# Patient Record
Sex: Male | Born: 1970
Health system: Southern US, Community
[De-identification: ages and names within clinical notes are randomized; demographics above are authoritative.]

## PROBLEM LIST (undated history)

## (undated) VITALS — BP 114/70 | HR 77 | Ht 73.5 in | Wt 290.5 lb

## (undated) DIAGNOSIS — M25562 Pain in left knee: Secondary | ICD-10-CM

## (undated) DIAGNOSIS — M25561 Pain in right knee: Secondary | ICD-10-CM

## (undated) DIAGNOSIS — R0789 Other chest pain: Secondary | ICD-10-CM

## (undated) DIAGNOSIS — E669 Obesity, unspecified: Secondary | ICD-10-CM

## (undated) DIAGNOSIS — Z0000 Encounter for general adult medical examination without abnormal findings: Secondary | ICD-10-CM

## (undated) DIAGNOSIS — M549 Dorsalgia, unspecified: Secondary | ICD-10-CM

## (undated) HISTORY — DX: Dorsalgia, unspecified: M54.9

## (undated) HISTORY — DX: Pain in left knee: M25.562

## (undated) HISTORY — DX: Obesity, unspecified: E66.9

## (undated) HISTORY — DX: Pain in right knee: M25.561

## (undated) HISTORY — DX: Encounter for general adult medical examination without abnormal findings: Z00.00

## (undated) HISTORY — DX: Other chest pain: R07.89

## (undated) HISTORY — PX: BACK SURGERY: SHX140

---

## 1997-11-27 HISTORY — PX: KNEE SURGERY: SHX244

## 2009-10-20 ENCOUNTER — Encounter: Payer: Self-pay | Admitting: Family Medicine

## 2010-06-13 ENCOUNTER — Ambulatory Visit: Payer: Self-pay | Admitting: Family Medicine

## 2010-06-13 DIAGNOSIS — M25519 Pain in unspecified shoulder: Secondary | ICD-10-CM | POA: Insufficient documentation

## 2010-06-13 DIAGNOSIS — M545 Low back pain, unspecified: Secondary | ICD-10-CM | POA: Insufficient documentation

## 2010-06-13 DIAGNOSIS — M25529 Pain in unspecified elbow: Secondary | ICD-10-CM | POA: Insufficient documentation

## 2010-06-13 DIAGNOSIS — R030 Elevated blood-pressure reading, without diagnosis of hypertension: Secondary | ICD-10-CM | POA: Insufficient documentation

## 2010-06-13 DIAGNOSIS — M549 Dorsalgia, unspecified: Secondary | ICD-10-CM

## 2010-06-13 DIAGNOSIS — E782 Mixed hyperlipidemia: Secondary | ICD-10-CM | POA: Insufficient documentation

## 2010-06-13 DIAGNOSIS — F418 Other specified anxiety disorders: Secondary | ICD-10-CM | POA: Insufficient documentation

## 2010-06-13 DIAGNOSIS — G43009 Migraine without aura, not intractable, without status migrainosus: Secondary | ICD-10-CM | POA: Insufficient documentation

## 2010-06-13 DIAGNOSIS — G039 Meningitis, unspecified: Secondary | ICD-10-CM | POA: Insufficient documentation

## 2010-06-13 DIAGNOSIS — E669 Obesity, unspecified: Secondary | ICD-10-CM

## 2010-06-13 HISTORY — DX: Obesity, unspecified: E66.9

## 2010-06-13 HISTORY — DX: Dorsalgia, unspecified: M54.9

## 2010-11-07 ENCOUNTER — Ambulatory Visit: Payer: Self-pay | Admitting: Family Medicine

## 2010-11-07 ENCOUNTER — Encounter: Payer: Self-pay | Admitting: Family Medicine

## 2010-12-14 ENCOUNTER — Telehealth (INDEPENDENT_AMBULATORY_CARE_PROVIDER_SITE_OTHER): Payer: Self-pay | Admitting: *Deleted

## 2010-12-21 ENCOUNTER — Encounter: Payer: Self-pay | Admitting: Family Medicine

## 2010-12-27 NOTE — Letter (Signed)
Summary: Records from [facility] from [facility]   Imported By: Maryln Gottron 06/22/2010 15:49:49  _____________________________________________________________________  External Attachment:    Type:   Image     Comment:   External Document

## 2010-12-27 NOTE — Assessment & Plan Note (Signed)
Summary: TO EST [place]//CCM wife rsc/njr   Vital Signs:  Patient profile:   40 year old male Height:      73.5 inches (186.69 cm) Weight:      284 pounds (129.09 kg) BMI:     37.09 O2 Sat:      98 % on Room air Temp:     97.9 degrees F (36.61 degrees C) oral Pulse rate:   84 / minute BP sitting:   122 / 90  (left arm) Cuff size:   large  Vitals Entered By: Josph Macho RMA (June 13, 2010 2:50 PM)  O2 Flow:  Room air CC: Est new pt/ CF Is Patient Diabetic? No   History of Present Illness: Patient in today with his wife to establish care. His complaints are all musculoskeletal today. He has a long history of back pain primarily low back. Suffered an injury years ago and had to have surgical decompression, has been managing it medically since then. Recently tried a pain management doctor with some PT and shots and did not find that helpful. Tries to minimize taking medicaitions but is recently having more trouble with pain and even getting out of bed on some days. Denies any incontinence or constipation. Is having new pains in his left elbow. Pain has been present for roughly a month w/o any associated swelling/heat/redness/trauma. She notes pain over lateral epicondye up arm to shoulder. Pain is severe enough to make him feel week. He denies numbness/tingling weakness in hand. He is also noting worsing  left knee pain and has radicular symptoms of pain and tingling in b/l Lower Extremities from knees down. No recent illnees, fevers, chills, malaise, CP, palp, SOB, GI or GU c/o.  Preventive Screening-Counseling & Management  Alcohol-Tobacco     Alcohol drinks/day: 0     Smoking Status: current     Smoking Cessation Counseling: YES     Packs/Day: 1.0  Caffeine-Diet-Exercise     Does Patient Exercise: no  Safety-Violence-Falls     Seat Belt Use: yes      Drug Use:  never and no.    Current Medications (verified): 1)  Valium 5 Mg Tabs (Diazepam) .... Once Daily 2)  Vicoprofen  7.5-200 Mg Tabs (Hydrocodone-Ibuprofen) .... As Needed  Allergies (verified): 1)  ! * Contrast Dye 2)  ! * Bee Stings  Past History:  Past Surgical History: Back surgery x 2 Right knee surgery 1999 for cartilage repair.   Family History: Father:  Mother:  Siblings:  MGM: MGF: PGM: PGF: Children: Son 1 yo, [employer] Family History of Arthritis Family History Diabetes 1st degree relative Family History Hypertension Family History of Stroke M 1st degree relative <50 Family History of Sudden Death  Social History: Retired/Disabled Married Current Smoker 1 ppd Alcohol use-no Drug use-no Regular exercise-no Smoking Status:  current Drug Use:  never, no Does Patient Exercise:  no Packs/Day:  1.0 Seat Belt Use:  yes  Physical Exam  General:  Well-developed,well-nourished,in no acute distress; alert,appropriate and cooperative throughout examination Head:  Normocephalic and atraumatic without obvious abnormalities. No apparent alopecia or balding. Eyes:  No corneal or conjunctival inflammation noted. EOMI. Perrla.  Ears:  External ear exam shows no significant lesions or deformities.  Otoscopic examination reveals clear canals, tympanic membranes are intact bilaterally without bulging, retraction, inflammation or discharge. Hearing is grossly normal bilaterally. Nose:  External nasal examination shows no deformity or inflammation. Nasal mucosa are pink and moist without lesions or exudates. Mouth:  Oral mucosa and  oropharynx without lesions or exudates.  Teeth in good repair. Neck:  No deformities, masses, or tenderness noted. Lungs:  Normal respiratory effort, chest expands symmetrically. Lungs are clear to auscultation, no crackles or wheezes. Heart:  Normal rate and regular rhythm. S1 and S2 normal without gallop, murmur, click, rub or other extra sounds. Abdomen:  Bowel sounds positive,abdomen soft and non-tender without masses, organomegaly or hernias noted. Msk:  No  deformity or scoliosis noted of thoracic or lumbar spine.   Pulses:  R and L carotid dorsalis pedis and posterior tibial pulses are full and equal bilaterally Extremities:  No clubbing, cyanosis, edema, or deformity noted  Neurologic:  No cranial nerve deficits noted. Station and gait are normal. Plantar reflexes are down-going bilaterally. DTRs are symmetrical throughout. Sensory, motor and coordinative functions appear intact. Skin:  Intact without suspicious lesions or rashes Cervical Nodes:  No lymphadenopathy noted Psych:  Cognition and judgment appear intact. Alert and cooperative with normal attention span and concentration. No apparent delusions, illusions, hallucinations   Impression & Recommendations:  Problem # 1:  ELBOW PAIN, LEFT (ICD-719.42) Prednisone burst given and cont other pain meds as needed, patient will contact his orthopaedist at [facility] regarding evaluation of left elbow and shoulder if he needs a new referral for this he will contact the office  Problem # 2:  ELEVATED BLOOD PRESSURE WITHOUT DIAGNOSIS OF HYPERTENSION (ICD-796.2) Mildly elevation of diastolic pressure, avoid sodium and attempt 7 hours sleep each night. Continue to monitor  Problem # 3:  OVERWEIGHT (ICD-278.02) Encouraged decreased by mouth intake and as much movement as tolerated  Problem # 4:  MIXED HYPERLIPIDEMIA (ICD-272.2) Avoid trans and saturated fats, increase exercise, consider fish oil caps  Complete Medication List: 1)  Valium 5 Mg Tabs (Diazepam) .... Once daily 2)  Vicoprofen 7.5-200 Mg Tabs (Hydrocodone-ibuprofen) .Marland Kitchen.. 1 tab by mouth q 6 hours as needed pain 3)  Baclofen 10 Mg Tabs (Baclofen) .Marland Kitchen.. 1-2 tabs by mouth q 6 hours as needed pain 4)  Prednisone 20 Mg Tabs (Prednisone) .... 2 tabs by mouth once daily x 5d  Patient Instructions: 1)  Please schedule a follow-up appointment in 3 months to 4 months 2)  Tobacco is very bad for your health and your loved ones ! You should stop  smoking !  3)  Stop smoking tips: Choose a quit date. Cut down before the quit date. Decide what you will do as a substitute when you feel the urge to smoke(gum, toothpick, exercise).  4)  You need to lose weight. Consider a lower calorie diet and regular exercise.  5)  apply heat and/or ice to left shoulder and left elbow and contact Dr Luther Parody at [facility] for further evaluation Prescriptions: VICOPROFEN 7.5-200 MG TABS (HYDROCODONE-IBUPROFEN) 1 tab by mouth q 6 hours as needed pain  #90 x 1   Entered and Authorized by:   Danise Edge MD   Signed by:   Danise Edge MD on 06/13/2010   Method used:   Print then Give to Patient   RxID:   [id number] PREDNISONE 20 MG TABS (PREDNISONE) 2 tabs by mouth once daily x 5d  #10 x 0   Entered and Authorized by:   Danise Edge MD   Signed by:   Danise Edge MD on 06/13/2010   Method used:   Print then Give to Patient   RxID:   [id number] BACLOFEN 10 MG TABS (BACLOFEN) 1-2 tabs by mouth q 6 hours as needed pain  #60  x 2   Entered and Authorized by:   Danise Edge MD   Signed by:   Danise Edge MD on 06/13/2010   Method used:   Print then Give to Patient   RxID:   [id number]

## 2010-12-29 NOTE — Assessment & Plan Note (Signed)
Summary: follow up/cjr   Vital Signs:  Patient profile:   40 year old male Height:      73.5 inches (186.69 cm) Weight:      290.50 pounds (132.05 kg) O2 Sat:      97 % on Room air Temp:     97.5 degrees F (36.39 degrees C) oral Pulse rate:   77 / minute BP sitting:   114 / 70  (right arm) Cuff size:   large  Vitals Entered By: Josph Macho RMA (November 07, 2010 2:55 PM)  O2 Flow:  Room air CC: follow-up visit Is Patient Diabetic? No   History of Present Illness: 40 year old Caucasian male in today for followup on multiple medical problems. He has chronic and ongoing pain diffusely notably in his left arm especially over the shoulder but primarily in his low back. He has recently gone through a course of treatment the pain clinic and then subjected to a total of 6 epidural injections without any significant relief. He would not like to continue that. This time very minimal meds and stated active as possible. At the present time he wants no further referral but instead will manage with minimal meds. No wheezing illness, fevers, chills, chest pain, shortness of breath, GI or GU complaints. He is very tearful during today's visit and acknowledges that his anxiety and depression are worsening he is having a near panic attacks with palpitations, increased anxiety and even paresthesias in his fingertips when his symptoms worsen. multiple stressors but his biggest issue at the present time is his estrangement from his 104 year old daughter and a poor choices she is currently making. He worries daily  Preventive Screening-Counseling & Management  Alcohol-Tobacco     Smoking Cessation Counseling: YES  Current Medications (verified): 1)  Valium 5 Mg Tabs (Diazepam) .... Once Daily 2)  Vicoprofen 7.5-200 Mg Tabs (Hydrocodone-Ibuprofen) .Marland Kitchen.. 1 Tab By Mouth Q 6 Hours As Needed Pain 3)  Baclofen 10 Mg Tabs (Baclofen) .Marland Kitchen.. 1-2 Tabs By Mouth Q 6 Hours As Needed Pain  Allergies (verified): 1)  !  * Contrast Dye 2)  ! * Bee Stings  Past History:  Past medical history reviewed for relevance to current acute and chronic problems. Social history (including risk factors) reviewed for relevance to current acute and chronic problems.  Social History: Reviewed history from 06/13/2010 and no changes required. Retired/Disabled Married Current Smoker 1 ppd Alcohol use-no Drug use-no Regular exercise-no  Review of Systems      See HPI  Physical Exam  General:  Well-developed,well-nourished,in no acute distress; alert,appropriate and cooperative throughout examination Head:  Normocephalic and atraumatic without obvious abnormalities. No apparent alopecia or balding. Mouth:  Oral mucosa and oropharynx without lesions or exudates. Neck:  No deformities, masses, or tenderness noted. Lungs:  Normal respiratory effort, chest expands symmetrically. Lungs are clear to auscultation, no crackles or wheezes. Heart:  Normal rate and regular rhythm. S1 and S2 normal without gallop, murmur, click, rub or other extra sounds. Abdomen:  Bowel sounds positive,abdomen soft and non-tender without masses, organomegaly or hernias noted.  Extremities:  No clubbing, cyanosis, edema, or deformity noted with normal full range of motion of all joints.   Psych:  Cognition and judgment appear intact. Alert and cooperative with normal attention span and concentration. No apparent delusions, illusions, hallucinations   Impression & Recommendations:  Problem # 1:  DEPRESSION (ICD-311)  His updated medication list for this problem includes:    Valium 5 Mg Tabs (Diazepam) ..... Once daily  Citalopram Hydrobromide 10 Mg Tabs (Citalopram hydrobromide) .Marland Kitchen... 1 tab by mouth daily    Alprazolam 0.25 Mg Tabs (Alprazolam) .Marland Kitchen... 1 tab by mouth once daily as needed anxiety Symptoms worsening due to trouble with his 40 yo daughter. He agrees to try a course of Citalopram.  Problem # 2:  ELEVATED BLOOD PRESSURE WITHOUT  DIAGNOSIS OF HYPERTENSION (ICD-796.2) Improved today encouraged to maintain 7-8 hours of sleep and minimize sodium and maintain weight  Problem # 3:  LOW BACK PAIN SYNDROME (ICD-724.2)  His updated medication list for this problem includes:    Vicoprofen 7.5-200 Mg Tabs (Hydrocodone-ibuprofen) .Marland Kitchen... 1 tab by mouth q 6 hours as needed pain    Baclofen 10 Mg Tabs (Baclofen) .Marland Kitchen... 1-2 tabs by mouth q 6 hours as needed pain Pain is daily but he manages with minimal meds. He tried a course of treatment at a pain clinic with epidural injections but did not get any relief and does not want to continue with this care. Maintain as much activity as tolerated and continue current meds.  Complete Medication List: 1)  Valium 5 Mg Tabs (Diazepam) .... Once daily 2)  Vicoprofen 7.5-200 Mg Tabs (Hydrocodone-ibuprofen) .Marland Kitchen.. 1 tab by mouth q 6 hours as needed pain 3)  Baclofen 10 Mg Tabs (Baclofen) .Marland Kitchen.. 1-2 tabs by mouth q 6 hours as needed pain 4)  Citalopram Hydrobromide 10 Mg Tabs (Citalopram hydrobromide) .Marland Kitchen.. 1 tab by mouth daily 5)  Alprazolam 0.25 Mg Tabs (Alprazolam) .Marland Kitchen.. 1 tab by mouth once daily as needed anxiety  Patient Instructions: 1)  Please schedule a follow-up appointment in 3 months .  2)  Call or come in sooner if symptoms worsen 3)  Tobacco is very bad for your health and your loved ones ! You should stop smoking !  4)  Stop smoking tips: Choose a quit date. Cut down before the quit date. Decide what you will do as a substitute when you feel the urge to smoke(gum, toothpick, exercise).  Prescriptions: ALPRAZOLAM 0.25 MG TABS (ALPRAZOLAM) 1 tab by mouth once daily as needed anxiety  #30 x 2   Entered and Authorized by:   Danise Edge MD   Signed by:   Danise Edge MD on 11/07/2010   Method used:   Print then Give to Patient   RxID:   [id number] CITALOPRAM HYDROBROMIDE 10 MG TABS (CITALOPRAM HYDROBROMIDE) 1 tab by mouth daily  #30 x 2   Entered and Authorized by:   Danise Edge  MD   Signed by:   Danise Edge MD on 11/07/2010   Method used:   Print then Give to Patient   RxID:   [id number] BACLOFEN 10 MG TABS (BACLOFEN) 1-2 tabs by mouth q 6 hours as needed pain  #60 x 2   Entered by:   Josph Macho RMA   Authorized by:   Danise Edge MD   Signed by:   Josph Macho RMA on 11/07/2010   Method used:   Print then Give to Patient   RxID:   [id number] VICOPROFEN 7.5-200 MG TABS (HYDROCODONE-IBUPROFEN) 1 tab by mouth q 6 hours as needed pain  #90 x 2   Entered by:   Josph Macho RMA   Authorized by:   Danise Edge MD   Signed by:   Josph Macho RMA on 11/07/2010   Method used:   Print then Give to Patient   RxID:   [id number] VALIUM 5 MG TABS (DIAZEPAM) once daily  #30 x 2   Entered  by:   Josph Macho RMA   Authorized by:   Danise Edge MD   Signed by:   Josph Macho RMA on 11/07/2010   Method used:   Print then Give to Patient   RxID:   [id number]    Orders Added: 1)  Est. Patient Level IV [28413]

## 2010-12-29 NOTE — Progress Notes (Signed)
Summary: Left elbow  Phone Note Call from Patient Call back at [phone]    Summary of Call: Pts spouse is calling stating at last appt pt was told he had tennis elbow in the right elbow. Pts spouse is now saying it is in the left elbow and has a big knot on it? Can't hardly stand the pain from even touching it. What can they do since they live over 2 hours away. Initial call taken by: Josph Macho RMA,  December 14, 2010 1:49 PM  Follow-up for Phone Call        It can happen in either elbow and it could also be gouty arthritis or an infection would try heat or ice and see if either one gives any relief. Would do some Aleve 220mg  2 tabs by mouth two times a day and if it keeps bothering him I would have it looked at to r/u gout by blood work and infection by inspection Follow-up by: Danise Edge MD,  December 14, 2010 2:00 PM  Additional Follow-up for Phone Call Additional follow up Details #1::        Pts spouse informed Additional Follow-up by: Josph Macho RMA,  December 14, 2010 2:23 PM

## 2011-02-05 ENCOUNTER — Encounter: Payer: Self-pay | Admitting: Family Medicine

## 2011-02-14 ENCOUNTER — Ambulatory Visit: Payer: Self-pay | Admitting: Family Medicine

## 2011-06-07 ENCOUNTER — Telehealth: Payer: Self-pay
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
## 2012-08-08 DIAGNOSIS — R1901 Right upper quadrant abdominal swelling, mass and lump: Secondary | ICD-10-CM | POA: Diagnosis not present

## 2012-09-04 DIAGNOSIS — R7301 Impaired fasting glucose: Secondary | ICD-10-CM | POA: Diagnosis not present

## 2012-09-04 DIAGNOSIS — R1901 Right upper quadrant abdominal swelling, mass and lump: Secondary | ICD-10-CM | POA: Diagnosis not present

## 2012-09-04 DIAGNOSIS — Z23 Encounter for immunization: Secondary | ICD-10-CM | POA: Diagnosis not present

## 2012-09-04 DIAGNOSIS — Z136 Encounter for screening for cardiovascular disorders: Secondary | ICD-10-CM | POA: Diagnosis not present

## 2012-09-18 DIAGNOSIS — M545 Low back pain, unspecified: Secondary | ICD-10-CM | POA: Diagnosis not present

## 2012-09-18 DIAGNOSIS — R7301 Impaired fasting glucose: Secondary | ICD-10-CM | POA: Diagnosis not present

## 2012-09-18 DIAGNOSIS — Z0000 Encounter for general adult medical examination without abnormal findings: Secondary | ICD-10-CM | POA: Diagnosis not present

## 2013-01-23 ENCOUNTER — Encounter: Payer: Self-pay | Admitting: Family Medicine

## 2013-01-23 ENCOUNTER — Ambulatory Visit (INDEPENDENT_AMBULATORY_CARE_PROVIDER_SITE_OTHER): Payer: Medicare Other | Admitting: Family Medicine

## 2013-01-23 VITALS — BP 110/84 | HR 71 | Temp 97.6°F | Ht 73.5 in | Wt 287.1 lb

## 2013-01-23 DIAGNOSIS — R030 Elevated blood-pressure reading, without diagnosis of hypertension: Secondary | ICD-10-CM | POA: Diagnosis not present

## 2013-01-23 DIAGNOSIS — F329 Major depressive disorder, single episode, unspecified: Secondary | ICD-10-CM | POA: Diagnosis not present

## 2013-01-23 DIAGNOSIS — M545 Low back pain, unspecified: Secondary | ICD-10-CM

## 2013-01-23 DIAGNOSIS — M549 Dorsalgia, unspecified: Secondary | ICD-10-CM

## 2013-01-23 DIAGNOSIS — F32A Depression, unspecified: Secondary | ICD-10-CM

## 2013-01-23 MED ORDER — HYDROCODONE-IBUPROFEN 7.5-200 MG PO TABS: 1.0000 | ORAL_TABLET | Freq: Three times a day (TID) | ORAL | Status: DC | PRN

## 2013-01-23 MED ORDER — CARISOPRODOL 350 MG PO TABS: 350.0000 mg | ORAL_TABLET | Freq: Three times a day (TID) | ORAL | Status: DC | PRN

## 2013-01-23 MED ORDER — ESCITALOPRAM OXALATE 10 MG PO TABS: 10.0000 mg | ORAL_TABLET | Freq: Every day | ORAL | Status: DC

## 2013-01-23 NOTE — Patient Instructions (Addendum)
Back Pain, Adult Low back pain is very common. About 1 in 5 people have back pain.The cause of low back pain is rarely dangerous. The pain often gets better over time.About half of people with a sudden onset of back pain feel better in just 2 weeks. About 8 in 10 people feel better by 6 weeks.  CAUSES Some common causes of back pain include:  Strain of the muscles or ligaments supporting the spine.  Wear and tear (degeneration) of the spinal discs.  Arthritis.  Direct injury to the back. DIAGNOSIS Most of the time, the direct cause of low back pain is not known.However, back pain can be treated effectively even when the exact cause of the pain is unknown.Answering your caregiver's questions about your overall health and symptoms is one of the most accurate ways to make sure the cause of your pain is not dangerous. If your caregiver needs more information, he or she may order lab work or imaging tests (X-rays or MRIs).However, even if imaging tests show changes in your back, this usually does not require surgery. HOME CARE INSTRUCTIONS For many people, back pain returns.Since low back pain is rarely dangerous, it is often a condition that people can learn to manageon their own.   Remain active. It is stressful on the back to sit or stand in one place. Do not sit, drive, or stand in one place for more than 30 minutes at a time. Take short walks on level surfaces as soon as pain allows.Try to increase the length of time you walk each day.  Do not stay in bed.Resting more than 1 or 2 days can delay your recovery.  Do not avoid exercise or work.Your body is made to move.It is not dangerous to be active, even though your back may hurt.Your back will likely heal faster if you return to being active before your pain is gone.  Pay attention to your body when you bend and lift. Many people have less discomfortwhen lifting if they bend their knees, keep the load close to their bodies,and  avoid twisting. Often, the most comfortable positions are those that put less stress on your recovering back.  Find a comfortable position to sleep. Use a firm mattress and lie on your side with your knees slightly bent. If you lie on your back, put a pillow under your knees.  Only take over-the-counter or prescription medicines as directed by your caregiver. Over-the-counter medicines to reduce pain and inflammation are often the most helpful.Your caregiver may prescribe muscle relaxant drugs.These medicines help dull your pain so you can more quickly return to your normal activities and healthy exercise.  Put ice on the injured area.  Put ice in a plastic bag.  Place a towel between your skin and the bag.  Leave the ice on for 15 to 20 minutes, 3 to 4 times a day for the first 2 to 3 days. After that, ice and heat may be alternated to reduce pain and spasms.  Ask your caregiver about trying back exercises and gentle massage. This may be of some benefit.  Avoid feeling anxious or stressed.Stress increases muscle tension and can worsen back pain.It is important to recognize when you are anxious or stressed and learn ways to manage it.Exercise is a great option. SEEK MEDICAL CARE IF:  You have pain that is not relieved with rest or medicine.  You have pain that does not improve in 1 week.  You have new symptoms.  You are generally   not feeling well. SEEK IMMEDIATE MEDICAL CARE IF:   You have pain that radiates from your back into your legs.  You develop new bowel or bladder control problems.  You have unusual weakness or numbness in your arms or legs.  You develop nausea or vomiting.  You develop abdominal pain.  You feel faint. Document Released: 11/13/2005 Document Revised: 05/14/2012 Document Reviewed: 04/03/2011 Exit[employer] Patient Information 2013 [employer].  

## 2013-01-26 ENCOUNTER — Encounter: Payer: Self-pay | Admitting: Family Medicine

## 2013-01-26 NOTE — Assessment & Plan Note (Signed)
Agrees to try Escitalopram reasess at next visit or sooner as needed.

## 2013-01-26 NOTE — Assessment & Plan Note (Signed)
With chonic pain and radicular symptoms. Is unable to work at this time due to persistent symptoms
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
## 2013-01-26 NOTE — Assessment & Plan Note (Signed)
Well controlled today, no changes 

## 2013-06-02 ENCOUNTER — Ambulatory Visit: Payer: Medicare Other | Admitting: Family Medicine

## 2013-08-07 ENCOUNTER — Telehealth: Payer: Self-pay | Admitting: Family Medicine

## 2013-08-07 NOTE — Telephone Encounter (Signed)
Please advise 

## 2013-08-07 NOTE — Telephone Encounter (Signed)
Left a message for patient to return my call. 

## 2013-08-07 NOTE — Telephone Encounter (Signed)
We also could find him an othropaedics in [place] or [place] which would be closer. See what he prefers. I would not want to see Dr Hyacinth Meeker either

## 2013-08-07 NOTE — Telephone Encounter (Signed)
Patients wife called in stating that patient would like a referral to an orthopedic regarding knee pain. She states that they live in [place] and do not mind coming to [place] for this. Also, she states that patient does not want to see Dr. Miller(orthopedic in [facility]).

## 2013-08-14 ENCOUNTER — Other Ambulatory Visit: Payer: Self-pay | Admitting: Family Medicine

## 2013-08-14 DIAGNOSIS — M25569 Pain in unspecified knee: Secondary | ICD-10-CM

## 2013-08-14 NOTE — Telephone Encounter (Signed)
Wife returned call & states that [facility] will be more convenient for patient to see an orthopedic.

## 2013-08-14 NOTE — Telephone Encounter (Signed)
Please advise 

## 2013-08-14 NOTE — Telephone Encounter (Signed)
Attempted to all patient.  LMTC on home answering machine.

## 2013-08-21 DIAGNOSIS — M23305 Other meniscus derangements, unspecified medial meniscus, unspecified knee: Secondary | ICD-10-CM | POA: Diagnosis not present

## 2013-09-04 DIAGNOSIS — H44619 Retained (old) magnetic foreign body in anterior chamber, unspecified eye: Secondary | ICD-10-CM | POA: Diagnosis not present

## 2013-09-04 DIAGNOSIS — M23305 Other meniscus derangements, unspecified medial meniscus, unspecified knee: Secondary | ICD-10-CM | POA: Diagnosis not present

## 2013-09-09 DIAGNOSIS — M23305 Other meniscus derangements, unspecified medial meniscus, unspecified knee: Secondary | ICD-10-CM | POA: Diagnosis not present

## 2014-02-24 DIAGNOSIS — R059 Cough, unspecified: Secondary | ICD-10-CM | POA: Diagnosis not present

## 2014-02-24 DIAGNOSIS — R079 Chest pain, unspecified: Secondary | ICD-10-CM | POA: Diagnosis not present

## 2014-02-24 DIAGNOSIS — R112 Nausea with vomiting, unspecified: Secondary | ICD-10-CM | POA: Diagnosis not present

## 2014-02-24 DIAGNOSIS — R0602 Shortness of breath: Secondary | ICD-10-CM | POA: Diagnosis not present

## 2014-02-24 DIAGNOSIS — R0789 Other chest pain: Secondary | ICD-10-CM | POA: Diagnosis not present

## 2014-02-24 DIAGNOSIS — J209 Acute bronchitis, unspecified: Secondary | ICD-10-CM | POA: Diagnosis not present

## 2014-02-24 DIAGNOSIS — R05 Cough: Secondary | ICD-10-CM | POA: Diagnosis not present

## 2014-04-14 ENCOUNTER — Encounter: Payer: Self-pay | Admitting: Family Medicine

## 2014-04-14 ENCOUNTER — Other Ambulatory Visit: Payer: Self-pay | Admitting: Family Medicine

## 2014-04-14 ENCOUNTER — Ambulatory Visit (INDEPENDENT_AMBULATORY_CARE_PROVIDER_SITE_OTHER): Payer: Medicare Other | Admitting: Family Medicine

## 2014-04-14 ENCOUNTER — Ambulatory Visit (HOSPITAL_BASED_OUTPATIENT_CLINIC_OR_DEPARTMENT_OTHER)
Admission: RE | Admit: 2014-04-14 | Discharge: 2014-04-14 | Disposition: A | Discharge status: Home / Self Care | Payer: Medicare Other | Source: Ambulatory Visit | Attending: Family Medicine | Admitting: Family Medicine

## 2014-04-14 ENCOUNTER — Telehealth: Payer: Self-pay | Admitting: Family Medicine

## 2014-04-14 VITALS — BP 124/84 | HR 67 | Temp 97.7°F | Ht 73.5 in | Wt 292.0 lb

## 2014-04-14 DIAGNOSIS — M542 Cervicalgia: Secondary | ICD-10-CM

## 2014-04-14 DIAGNOSIS — E669 Obesity, unspecified: Secondary | ICD-10-CM

## 2014-04-14 DIAGNOSIS — E119 Type 2 diabetes mellitus without complications: Secondary | ICD-10-CM

## 2014-04-14 DIAGNOSIS — M546 Pain in thoracic spine: Secondary | ICD-10-CM

## 2014-04-14 DIAGNOSIS — M47814 Spondylosis without myelopathy or radiculopathy, thoracic region: Secondary | ICD-10-CM | POA: Diagnosis not present

## 2014-04-14 DIAGNOSIS — IMO0002 Reserved for concepts with insufficient information to code with codable children: Secondary | ICD-10-CM | POA: Diagnosis not present

## 2014-04-14 DIAGNOSIS — M549 Dorsalgia, unspecified: Secondary | ICD-10-CM

## 2014-04-14 DIAGNOSIS — R7309 Other abnormal glucose: Secondary | ICD-10-CM | POA: Diagnosis not present

## 2014-04-14 DIAGNOSIS — R030 Elevated blood-pressure reading, without diagnosis of hypertension: Secondary | ICD-10-CM

## 2014-04-14 DIAGNOSIS — E785 Hyperlipidemia, unspecified: Secondary | ICD-10-CM | POA: Diagnosis not present

## 2014-04-14 DIAGNOSIS — E1169 Type 2 diabetes mellitus with other specified complication: Secondary | ICD-10-CM

## 2014-04-14 MED ORDER — HYDROCODONE-IBUPROFEN 7.5-200 MG PO TABS: 1.0000 | ORAL_TABLET | Freq: Three times a day (TID) | ORAL | Status: DC | PRN

## 2014-04-14 NOTE — Progress Notes (Signed)
Pre visit review using our clinic review tool, if applicable. No additional management support is needed unless otherwise documented below in the visit note. 

## 2014-04-14 NOTE — Patient Instructions (Signed)

## 2014-04-14 NOTE — Telephone Encounter (Signed)
Relevant patient education mailed to patient.  

## 2014-04-15 LAB — TSH: TSH: 3.513 u[IU]/mL (ref 0.350–4.500)

## 2014-04-15 LAB — CBC
HEMATOCRIT: 46.7 % (ref 39.0–52.0)
Hemoglobin: 15.8 g/dL (ref 13.0–17.0)
MCH: 29.8 pg (ref 26.0–34.0)
MCHC: 33.8 g/dL (ref 30.0–36.0)
MCV: 87.9 fL (ref 78.0–100.0)
Platelets: 269 10*3/uL (ref 150–400)
RBC: 5.31 MIL/uL (ref 4.22–5.81)
RDW: 14.4 % (ref 11.5–15.5)
WBC: 7.3 10*3/uL (ref 4.0–10.5)

## 2014-04-15 LAB — LIPID PANEL
CHOL/HDL RATIO: 4.4 ratio
Cholesterol: 189 mg/dL (ref 0–200)
HDL: 43 mg/dL (ref >39)
LDL Cholesterol: 109 mg/dL — ABNORMAL HIGH (ref 0–99)
TRIGLYCERIDES: 185 mg/dL — AB (ref <150)
VLDL: 37 mg/dL (ref 0–40)

## 2014-04-15 LAB — HEPATIC FUNCTION PANEL
ALK PHOS: 63 U/L (ref 39–117)
ALT: 24 U/L (ref 0–53)
AST: 18 U/L (ref 0–37)
Albumin: 4.2 g/dL (ref 3.5–5.2)
Bilirubin, Direct: 0.1 mg/dL (ref 0.0–0.3)
Indirect Bilirubin: 0.5 mg/dL (ref 0.2–1.2)
Total Bilirubin: 0.6 mg/dL (ref 0.2–1.2)
Total Protein: 6.9 g/dL (ref 6.0–8.3)

## 2014-04-15 LAB — RENAL FUNCTION PANEL
Albumin: 4.2 g/dL (ref 3.5–5.2)
BUN: 12 mg/dL (ref 6–23)
CO2: 28 mEq/L (ref 19–32)
CREATININE: 0.92 mg/dL (ref 0.50–1.35)
Calcium: 9.1 mg/dL (ref 8.4–10.5)
Chloride: 97 mEq/L (ref 96–112)
Glucose, Bld: 85 mg/dL (ref 70–99)
Phosphorus: 3.7 mg/dL (ref 2.3–4.6)
Potassium: 4.6 mEq/L (ref 3.5–5.3)
SODIUM: 134 meq/L — AB (ref 135–145)

## 2014-04-15 LAB — HEMOGLOBIN A1C
Hgb A1c MFr Bld: 7 % — ABNORMAL HIGH (ref <5.7)
Mean Plasma Glucose: 154 mg/dL — ABNORMAL HIGH (ref <117)

## 2014-04-16 ENCOUNTER — Other Ambulatory Visit: Payer: Self-pay

## 2014-04-16 MED ORDER — BLOOD GLUCOSE METER KIT: PACK | Status: DC

## 2014-04-16 MED ORDER — METFORMIN HCL 500 MG PO TABS: 500.0000 mg | ORAL_TABLET | Freq: Every day | ORAL | Status: DC

## 2014-04-19 ENCOUNTER — Encounter: Payer: Self-pay | Admitting: Family Medicine

## 2014-04-19 DIAGNOSIS — E1169 Type 2 diabetes mellitus with other specified complication: Secondary | ICD-10-CM | POA: Insufficient documentation

## 2014-04-19 DIAGNOSIS — E669 Obesity, unspecified: Secondary | ICD-10-CM | POA: Insufficient documentation

## 2014-04-19 NOTE — Assessment & Plan Note (Signed)
Encouraged DASH diet, decrease po intake and increase exercise as tolerated. Needs 7-8 hours of sleep nightly. Avoid trans fats, eat small, frequent meals every 4-5 hours with lean proteins, complex carbs and healthy fats. Minimize simple carbs, GMO foods. 

## 2014-04-19 NOTE — Assessment & Plan Note (Signed)
New onset, started with glucometer and checking sugars dialy and as needed. Metformin daily. hgba1c acceptable, minimize simple carbs. Increase exercise as tolerated.

## 2014-04-19 NOTE — Progress Notes (Signed)
Patient ID: Gary Short., male   DOB: 18-Apr-1971, 43 y.o.   MRN: [id number]  Gary Short [id number] [birth year] 04/19/2014      Progress Note-Follow Up  Subjective  Chief Complaint  Chief Complaint  Patient presents with  . Follow-up    HPI  Patient is a 43 year old male in today for routine medical care. Patient is in today accompanied by his family. He had a bad bronchitis in the winter but the cough is resolved. His greatest complaints are musculoskeletal. He has trouble with ongoing low back pain and radicular symptoms but is managing them back and thoracic back pain as well. Is noting thoracic back pain with radiation around his ribs to his anterior chest as well he describes a sharp pain with certain movements. No falls or trauma. No headaches. No recent fevers or acute illness. Denies CP/palp/SOB/HA/congestion/fevers/GI or GU c/o. Taking meds as prescribed  Past Medical History  Diagnosis Date  . Back pain 06/13/2010    Qualifier: Diagnosis of  By: Charlett Blake MD, Sonia Baller back at work in 2002, was able to work until 2004. Ultimately had  Surgeries to his lower back twice and after complications with a spinal leak, was never able to return to work Radicular symptoms occur b/l.   . Obesity, unspecified 06/13/2010    Qualifier: Diagnosis of  By: Charlett Blake MD, Erline Levine      Past Surgical History  Procedure Laterality Date  . Knee surgery  1999    cartilage repair  . Back surgery      X 2    Family History  Problem Relation Age of Onset  . Arthritis Other     family hx of  . Diabetes Other     family hx of  . Hypertension Other     family hx of  . Stroke Other     family hx of  . Sudden death Other     family hx of    History   Social History  . Marital Status: Married    Spouse Name: N/A    Number of Children: N/A  . Years of Education: N/A   Occupational History  . Not on file.   Social History Main Topics  . Smoking status: Current Every Day Smoker --  1.00 packs/day    Types: Cigarettes  . Smokeless tobacco: Not on file  . Alcohol Use: No  . Drug Use: No  . Sexual Activity:    Other Topics Concern  . Not on file   Social History Narrative  . No narrative on file    Current Outpatient Prescriptions on File Prior to Visit  Medication Sig Dispense Refill  . carisoprodol (SOMA) 350 MG tablet Take 1 tablet (350 mg total) by mouth 3 (three) times daily as needed for muscle spasms.  90 tablet  5  . escitalopram (LEXAPRO) 10 MG tablet Take 1 tablet (10 mg total) by mouth daily.  30 tablet  5   No current facility-administered medications on file prior to visit.    No Known Allergies  Review of Systems  Review of Systems  Constitutional: Positive for malaise/fatigue. Negative for fever.  HENT: Negative for congestion.   Eyes: Negative for discharge.  Respiratory: Negative for shortness of breath.   Cardiovascular: Positive for chest pain. Negative for palpitations and leg swelling.  Gastrointestinal: Negative for nausea, abdominal pain and diarrhea.  Genitourinary: Negative for dysuria.  Musculoskeletal: Positive for back pain, myalgias and neck  pain. Negative for falls.  Skin: Negative for rash.  Neurological: Negative for loss of consciousness and headaches.  Endo/Heme/Allergies: Negative for polydipsia.  Psychiatric/Behavioral: Negative for suicidal ideas. The patient is nervous/anxious. The patient does not have insomnia.     Objective  BP 124/84  Pulse 67  Temp(Src) 97.7 F (36.5 C) (Oral)  Ht 6' 1.5" (1.867 m)  Wt 292 lb (132.45 kg)  BMI 38.00 kg/m2  SpO2 97%  Physical Exam  Physical Exam  Constitutional: He is oriented to person, place, and time and well-developed, well-nourished, and in no distress. No distress.  HENT:  Head: Normocephalic and atraumatic.  Eyes: Conjunctivae are normal.  Neck: Neck supple. No thyromegaly present.  Cardiovascular: Normal rate, regular rhythm and normal heart sounds.   No  murmur heard. Pulmonary/Chest: Effort normal and breath sounds normal. No respiratory distress.  Abdominal: He exhibits no distension and no mass. There is no tenderness.  Musculoskeletal: He exhibits no edema.  Neurological: He is alert and oriented to person, place, and time.  Skin: Skin is warm.  Psychiatric: Memory, affect and judgment normal.    Lab Results  Component Value Date   TSH 3.513 04/14/2014   Lab Results  Component Value Date   WBC 7.3 04/14/2014   HGB 15.8 04/14/2014   HCT 46.7 04/14/2014   MCV 87.9 04/14/2014   PLT 269 04/14/2014   Lab Results  Component Value Date   CREATININE 0.92 04/14/2014   BUN 12 04/14/2014   NA 134* 04/14/2014   K 4.6 04/14/2014   CL 97 04/14/2014   CO2 28 04/14/2014   Lab Results  Component Value Date   ALT 24 04/14/2014   AST 18 04/14/2014   ALKPHOS 63 04/14/2014   BILITOT 0.6 04/14/2014   Lab Results  Component Value Date   CHOL 189 04/14/2014   Lab Results  Component Value Date   HDL 43 04/14/2014   Lab Results  Component Value Date   LDLCALC 109* 04/14/2014   Lab Results  Component Value Date   TRIG 185* 04/14/2014   Lab Results  Component Value Date   CHOLHDL 4.4 04/14/2014     Assessment & Plan  ELEVATED BLOOD PRESSURE WITHOUT DIAGNOSIS OF HYPERTENSION Well controlled, no changes to meds. Encouraged heart healthy diet such as the DASH diet and exercise as tolerated.   Back pain Now having more diffuse pain in neck and thoracic spine as well with some radicular symptoms to anterior chest encouraged pain meds sparingly. Encouraged moist heat and gentle stretching as tolerated. May try NSAIDs and prescription meds as directed and report if symptoms worsen or seek immediate care. Seek care if symptoms worsen or changes  Obesity, unspecified Encouraged DASH diet, decrease po intake and increase exercise as tolerated. Needs 7-8 hours of sleep nightly. Avoid trans fats, eat small, frequent meals every 4-5 hours with lean  proteins, complex carbs and healthy fats. Minimize simple carbs, GMO foods.  Diabetes mellitus type 2 in obese New onset, started with glucometer and checking sugars dialy and as needed. Metformin daily. hgba1c acceptable, minimize simple carbs. Increase exercise as tolerated.

## 2014-04-19 NOTE — Assessment & Plan Note (Signed)
Now having more diffuse pain in neck and thoracic spine as well with some radicular symptoms to anterior chest encouraged pain meds sparingly. Encouraged moist heat and gentle stretching as tolerated. May try NSAIDs and prescription meds as directed and report if symptoms worsen or seek immediate care. Seek care if symptoms worsen or changes

## 2014-04-19 NOTE — Assessment & Plan Note (Signed)
Well controlled, no changes to meds. Encouraged heart healthy diet such as the DASH diet and exercise as tolerated.  °

## 2014-04-23 ENCOUNTER — Telehealth: Payer: Self-pay

## 2014-04-23 NOTE — Telephone Encounter (Signed)
Gary Short left a message stating that she would like to know when pt is supposed to check his sugars? Spouse stated in message that she has been checking this 2 to 3 times a day but the pharmacist states the RX was only wrote for once a day?  Please advise?

## 2014-04-23 NOTE — Telephone Encounter (Signed)
Check in morning prior to breakfast roughly 5 days a week and after dinner 2 days a week and then prn for any symptoms. Weak, shakey, anxious etc

## 2014-04-24 NOTE — Telephone Encounter (Signed)
Left a detailed message on v/m

## 2014-04-30 ENCOUNTER — Telehealth: Payer: Self-pay | Admitting: Family Medicine

## 2014-04-30 NOTE — Telephone Encounter (Signed)
Patient wife called in with questions regarding patient testing strip directions.

## 2014-04-30 NOTE — Telephone Encounter (Signed)
Left message with patient to return my call.

## 2014-04-30 NOTE — Telephone Encounter (Signed)
pts spouse states spouses number is [phone].  I will correct in system and pt informed about the message that was left on vm. Pt voiced understanding

## 2014-04-30 NOTE — Telephone Encounter (Signed)
Please verify correct phone number

## 2014-07-06 ENCOUNTER — Other Ambulatory Visit: Payer: Self-pay | Admitting: Family Medicine

## 2014-07-06 NOTE — Telephone Encounter (Signed)
Rx request to pharmacy/[facility]  

## 2014-08-25 ENCOUNTER — Ambulatory Visit (INDEPENDENT_AMBULATORY_CARE_PROVIDER_SITE_OTHER): Payer: Medicare Other | Admitting: Family Medicine

## 2014-08-25 ENCOUNTER — Encounter: Payer: Self-pay | Admitting: Family Medicine

## 2014-08-25 VITALS — BP 121/73 | HR 72 | Temp 98.3°F | Ht 73.5 in | Wt 292.2 lb

## 2014-08-25 DIAGNOSIS — M542 Cervicalgia: Secondary | ICD-10-CM

## 2014-08-25 DIAGNOSIS — E782 Mixed hyperlipidemia: Secondary | ICD-10-CM

## 2014-08-25 DIAGNOSIS — M546 Pain in thoracic spine: Secondary | ICD-10-CM | POA: Diagnosis not present

## 2014-08-25 DIAGNOSIS — M25562 Pain in left knee: Secondary | ICD-10-CM

## 2014-08-25 DIAGNOSIS — E119 Type 2 diabetes mellitus without complications: Secondary | ICD-10-CM | POA: Diagnosis not present

## 2014-08-25 DIAGNOSIS — M25569 Pain in unspecified knee: Secondary | ICD-10-CM

## 2014-08-25 DIAGNOSIS — E1169 Type 2 diabetes mellitus with other specified complication: Secondary | ICD-10-CM

## 2014-08-25 DIAGNOSIS — M25561 Pain in right knee: Secondary | ICD-10-CM

## 2014-08-25 DIAGNOSIS — E669 Obesity, unspecified: Secondary | ICD-10-CM | POA: Diagnosis not present

## 2014-08-25 DIAGNOSIS — R030 Elevated blood-pressure reading, without diagnosis of hypertension: Secondary | ICD-10-CM

## 2014-08-25 MED ORDER — HYDROCODONE-IBUPROFEN 7.5-200 MG PO TABS: 1.0000 | ORAL_TABLET | Freq: Three times a day (TID) | ORAL | Status: DC | PRN

## 2014-08-25 MED ORDER — CARISOPRODOL 350 MG PO TABS: 350.0000 mg | ORAL_TABLET | Freq: Three times a day (TID) | ORAL | Status: DC | PRN

## 2014-08-25 NOTE — Progress Notes (Signed)
Pre visit review using our clinic review tool, if applicable. No additional management support is needed unless otherwise documented below in the visit note. 

## 2014-08-26 ENCOUNTER — Encounter: Payer: Self-pay | Admitting: Family Medicine

## 2014-08-26 DIAGNOSIS — M25561 Pain in right knee: Secondary | ICD-10-CM

## 2014-08-26 DIAGNOSIS — M25562 Pain in left knee: Secondary | ICD-10-CM

## 2014-08-26 HISTORY — DX: Pain in left knee: M25.561

## 2014-08-26 HISTORY — DX: Pain in left knee: M25.562

## 2014-08-26 LAB — HEMOGLOBIN A1C: HEMOGLOBIN A1C: 6.4 % (ref 4.6–6.5)

## 2014-08-26 NOTE — Assessment & Plan Note (Addendum)
hgba1c acceptable, minimize simple carbs. Increase exercise as tolerated. A1C 6.4. Has not been taking Metformin does not need to restart

## 2014-08-26 NOTE — Assessment & Plan Note (Signed)
Tolerating statin, encouraged heart healthy diet, avoid trans fats, minimize simple carbs and saturated fats. Increase exercise as tolerated 

## 2014-08-26 NOTE — Progress Notes (Signed)
Patient ID: Gary Gurney., male   DOB: Nov 17, 1971, 43 y.o.   MRN: [id number] Gary Short [id number] Feb 05, 1971 08/26/2014      Progress Note-Follow Up  Subjective  Chief Complaint  Chief Complaint  Patient presents with  . Follow-up    3 month    HPI  Patient is a 43 year old male in today for routine medical care. In today for followup. He took metformin for a month or so after his last visit but then stopped it. Has been eating better. Has been more physically active. No recent illness. Notes an increase in his back, neck, shoulder pain or muscle aches since becoming more active. He has been cleaning out his dad's house because his dad has been very ill no other new or acute complaints. When they check his blood sugars are generally in the 80-90 range. Denies CP/palp/SOB/HA/congestion/fevers/GI or GU c/o. Taking meds as prescribed  Past Medical History  Diagnosis Date  . Back pain 06/13/2010    Qualifier: Diagnosis of  By: Charlett Blake MD, Sonia Baller back at work in 2002, was able to work until 2004. Ultimately had  Surgeries to his lower back twice and after complications with a spinal leak, was never able to return to work Radicular symptoms occur b/l.   . Obesity, unspecified 06/13/2010    Qualifier: Diagnosis of  By: Charlett Blake MD, Erline Levine    . Knee pain, bilateral 08/26/2014    Past Surgical History  Procedure Laterality Date  . Knee surgery  1999    cartilage repair  . Back surgery      X 2    Family History  Problem Relation Age of Onset  . Arthritis Other     family hx of  . Diabetes Other     family hx of  . Hypertension Other     family hx of  . Stroke Other     family hx of  . Sudden death Other     family hx of    History   Social History  . Marital Status: Married    Spouse Name: N/A    Number of Children: N/A  . Years of Education: N/A   Occupational History  . Not on file.   Social History Main Topics  . Smoking status: Current Every Day Smoker  -- 1.00 packs/day    Types: Cigarettes  . Smokeless tobacco: Not on file  . Alcohol Use: No  . Drug Use: No  . Sexual Activity:    Other Topics Concern  . Not on file   Social History Narrative  . No narrative on file    Current Outpatient Prescriptions on File Prior to Visit  Medication Sig Dispense Refill  . Blood Glucose Monitoring Suppl (BLOOD GLUCOSE METER) kit Check blood glucose once daily and as needed. Dx: 250.00, HGBA1C level: 7.0%  1 each  0  . glucose blood (BAYER CONTOUR TEST) test strip Check blood glucose once daily and as needed. Dx: 250.00, HGBA1C level: 7.0%  100 each  5  . MICROLET LANCETS MISC Check blood glucose once daily and as needed. Dx: 250.00, HGBA1C level: 7.0%  100 each  5   No current facility-administered medications on file prior to visit.    Allergies  Allergen Reactions  . Iodides     Review of Systems  Review of Systems  Constitutional: Positive for malaise/fatigue. Negative for fever.  HENT: Negative for congestion.   Eyes: Negative for discharge.  Respiratory: Negative for shortness of breath.   Cardiovascular: Negative for chest pain, palpitations and leg swelling.  Gastrointestinal: Negative for nausea, abdominal pain and diarrhea.  Genitourinary: Negative for dysuria.  Musculoskeletal: Positive for back pain, joint pain, myalgias and neck pain. Negative for falls.  Skin: Negative for rash.  Neurological: Negative for loss of consciousness and headaches.  Endo/Heme/Allergies: Negative for polydipsia.  Psychiatric/Behavioral: Negative for depression and suicidal ideas. The patient is not nervous/anxious and does not have insomnia.     Objective  BP 121/73  Pulse 72  Temp(Src) 98.3 F (36.8 C) (Oral)  Ht 6' 1.5" (1.867 m)  Wt 292 lb 3.2 oz (132.541 kg)  BMI 38.02 kg/m2  SpO2 98%  Physical Exam  Physical Exam  Constitutional: He is oriented to person, place, and time and well-developed, well-nourished, and in no distress.  No distress.  HENT:  Head: Normocephalic and atraumatic.  Eyes: Conjunctivae are normal.  Neck: Neck supple. No thyromegaly present.  Cardiovascular: Normal rate, regular rhythm and normal heart sounds.   No murmur heard. Pulmonary/Chest: Effort normal and breath sounds normal. No respiratory distress.  Abdominal: He exhibits no distension and no mass. There is no tenderness.  Musculoskeletal: He exhibits no edema.  Neurological: He is alert and oriented to person, place, and time.  Skin: Skin is warm.  Psychiatric: Memory, affect and judgment normal.    Lab Results  Component Value Date   TSH 3.513 04/14/2014   Lab Results  Component Value Date   WBC 7.3 04/14/2014   HGB 15.8 04/14/2014   HCT 46.7 04/14/2014   MCV 87.9 04/14/2014   PLT 269 04/14/2014   Lab Results  Component Value Date   CREATININE 0.92 04/14/2014   BUN 12 04/14/2014   NA 134* 04/14/2014   K 4.6 04/14/2014   CL 97 04/14/2014   CO2 28 04/14/2014   Lab Results  Component Value Date   ALT 24 04/14/2014   AST 18 04/14/2014   ALKPHOS 63 04/14/2014   BILITOT 0.6 04/14/2014   Lab Results  Component Value Date   CHOL 189 04/14/2014   Lab Results  Component Value Date   HDL 43 04/14/2014   Lab Results  Component Value Date   LDLCALC 109* 04/14/2014   Lab Results  Component Value Date   TRIG 185* 04/14/2014   Lab Results  Component Value Date   CHOLHDL 4.4 04/14/2014     Assessment & Plan  Obesity Encouraged DASH diet, decrease po intake and increase exercise as tolerated. Needs 7-8 hours of sleep nightly. Avoid trans fats, eat small, frequent meals every 4-5 hours with lean proteins, complex carbs and healthy fats. Minimize simple carbs, GMO foods.  ELEVATED BLOOD PRESSURE WITHOUT DIAGNOSIS OF HYPERTENSION Well controlled. Encouraged heart healthy diet such as the DASH diet and exercise as tolerated.   Diabetes mellitus type 2 in obese hgba1c acceptable, minimize simple carbs. Increase exercise as  tolerated. A1C 6.4. Has not been taking Metformin does not need to restart  Knee pain, bilateral No recent injury but increased use. May use topical treatments. Given refill on pain meds which he uses very sparingly  MIXED HYPERLIPIDEMIA Tolerating statin, encouraged heart healthy diet, avoid trans fats, minimize simple carbs and saturated fats. Increase exercise as tolerated

## 2014-08-26 NOTE — Assessment & Plan Note (Signed)
No recent injury but increased use. May use topical treatments. Given refill on pain meds which he uses very sparingly

## 2014-08-26 NOTE — Assessment & Plan Note (Signed)
Well controlled. Encouraged heart healthy diet such as the DASH diet and exercise as tolerated.  

## 2014-08-26 NOTE — Assessment & Plan Note (Signed)
Encouraged DASH diet, decrease po intake and increase exercise as tolerated. Needs 7-8 hours of sleep nightly. Avoid trans fats, eat small, frequent meals every 4-5 hours with lean proteins, complex carbs and healthy fats. Minimize simple carbs, GMO foods. 

## 2014-12-24 ENCOUNTER — Encounter: Payer: Self-pay | Admitting: Family Medicine

## 2014-12-24 ENCOUNTER — Ambulatory Visit (INDEPENDENT_AMBULATORY_CARE_PROVIDER_SITE_OTHER): Payer: Medicare Other | Admitting: Family Medicine

## 2014-12-24 VITALS — BP 132/84 | HR 75 | Temp 98.0°F | Ht 73.5 in | Wt 299.8 lb

## 2014-12-24 DIAGNOSIS — E782 Mixed hyperlipidemia: Secondary | ICD-10-CM

## 2014-12-24 DIAGNOSIS — Z6839 Body mass index (BMI) 39.0-39.9, adult: Secondary | ICD-10-CM | POA: Diagnosis not present

## 2014-12-24 DIAGNOSIS — E1169 Type 2 diabetes mellitus with other specified complication: Secondary | ICD-10-CM

## 2014-12-24 DIAGNOSIS — Z23 Encounter for immunization: Secondary | ICD-10-CM | POA: Diagnosis not present

## 2014-12-24 DIAGNOSIS — E669 Obesity, unspecified: Secondary | ICD-10-CM

## 2014-12-24 DIAGNOSIS — E119 Type 2 diabetes mellitus without complications: Secondary | ICD-10-CM

## 2014-12-24 DIAGNOSIS — M549 Dorsalgia, unspecified: Secondary | ICD-10-CM | POA: Diagnosis not present

## 2014-12-24 DIAGNOSIS — R030 Elevated blood-pressure reading, without diagnosis of hypertension: Secondary | ICD-10-CM | POA: Diagnosis not present

## 2014-12-24 DIAGNOSIS — Z79899 Other long term (current) drug therapy: Secondary | ICD-10-CM | POA: Diagnosis not present

## 2014-12-24 DIAGNOSIS — M546 Pain in thoracic spine: Secondary | ICD-10-CM

## 2014-12-24 DIAGNOSIS — M542 Cervicalgia: Secondary | ICD-10-CM

## 2014-12-24 LAB — COMPREHENSIVE METABOLIC PANEL
ALT: 26 U/L (ref 0–53)
AST: 17 U/L (ref 0–37)
Albumin: 4.4 g/dL (ref 3.5–5.2)
Alkaline Phosphatase: 71 U/L (ref 39–117)
BUN: 14 mg/dL (ref 6–23)
CHLORIDE: 103 meq/L (ref 96–112)
CO2: 25 meq/L (ref 19–32)
Calcium: 9.4 mg/dL (ref 8.4–10.5)
Creatinine, Ser: 0.95 mg/dL (ref 0.40–1.50)
GFR: 91.85 mL/min (ref >60.00)
Glucose, Bld: 149 mg/dL — ABNORMAL HIGH (ref 70–99)
POTASSIUM: 4.4 meq/L (ref 3.5–5.1)
SODIUM: 136 meq/L (ref 135–145)
Total Bilirubin: 0.5 mg/dL (ref 0.2–1.2)
Total Protein: 7 g/dL (ref 6.0–8.3)

## 2014-12-24 LAB — TSH: TSH: 1.61 u[IU]/mL (ref 0.35–4.50)

## 2014-12-24 LAB — CBC WITH DIFFERENTIAL/PLATELET
BASOS ABS: 0 10*3/uL (ref 0.0–0.1)
Basophils Relative: 0.6 % (ref 0.0–3.0)
Eosinophils Absolute: 0.2 10*3/uL (ref 0.0–0.7)
Eosinophils Relative: 2.7 % (ref 0.0–5.0)
HCT: 48.6 % (ref 39.0–52.0)
HEMOGLOBIN: 16.4 g/dL (ref 13.0–17.0)
LYMPHS ABS: 3 10*3/uL (ref 0.7–4.0)
Lymphocytes Relative: 44.2 % (ref 12.0–46.0)
MCHC: 33.7 g/dL (ref 30.0–36.0)
MCV: 89.7 fl (ref 78.0–100.0)
Monocytes Absolute: 0.4 10*3/uL (ref 0.1–1.0)
Monocytes Relative: 5.4 % (ref 3.0–12.0)
NEUTROS PCT: 47.1 % (ref 43.0–77.0)
Neutro Abs: 3.2 10*3/uL (ref 1.4–7.7)
PLATELETS: 240 10*3/uL (ref 150.0–400.0)
RBC: 5.42 Mil/uL (ref 4.22–5.81)
RDW: 14 % (ref 11.5–15.5)
WBC: 6.8 10*3/uL (ref 4.0–10.5)

## 2014-12-24 LAB — LIPID PANEL
Cholesterol: 187 mg/dL (ref 0–200)
HDL: 41.7 mg/dL (ref >39.00)
LDL CALC: 113 mg/dL — AB (ref 0–99)
NONHDL: 145.3
TRIGLYCERIDES: 162 mg/dL — AB (ref 0.0–149.0)
Total CHOL/HDL Ratio: 4
VLDL: 32.4 mg/dL (ref 0.0–40.0)

## 2014-12-24 LAB — HEMOGLOBIN A1C: HEMOGLOBIN A1C: 7.1 % — AB (ref 4.6–6.5)

## 2014-12-24 MED ORDER — HYDROCODONE-IBUPROFEN 7.5-200 MG PO TABS: 1.0000 | ORAL_TABLET | Freq: Three times a day (TID) | ORAL | Status: DC | PRN

## 2014-12-24 MED ORDER — CARISOPRODOL 350 MG PO TABS: 350.0000 mg | ORAL_TABLET | Freq: Three times a day (TID) | ORAL | Status: DC | PRN

## 2014-12-24 NOTE — Assessment & Plan Note (Signed)
Well controlled. Encouraged heart healthy diet such as the DASH diet and exercise as tolerated.  

## 2014-12-24 NOTE — Assessment & Plan Note (Signed)
hgba1c acceptable, minimize simple carbs. Increase exercise as tolerated. Continue current meds. Repeat hgba1c today

## 2014-12-24 NOTE — Progress Notes (Signed)
Pre visit review using our clinic review tool, if applicable. No additional management support is needed unless otherwise documented below in the visit note. 

## 2014-12-24 NOTE — Patient Instructions (Signed)
Encouraged moist heat and gentle stretching as tolerated. May try NSAIDs and prescription meds as directed and report if symptoms worsen or seek immediate care. Try the Sal[place] Pas patches or gel twice daily  Back Pain, Adult Low back pain is very common. About 1 in 5 people have back pain.The cause of low back pain is rarely dangerous. The pain often gets better over time.About half of people with a sudden onset of back pain feel better in just 2 weeks. About 8 in 10 people feel better by 6 weeks.  CAUSES Some common causes of back pain include:  Strain of the muscles or ligaments supporting the spine.  Wear and tear (degeneration) of the spinal discs.  Arthritis.  Direct injury to the back. DIAGNOSIS Most of the time, the direct cause of low back pain is not known.However, back pain can be treated effectively even when the exact cause of the pain is unknown.Answering your caregiver's questions about your overall health and symptoms is one of the most accurate ways to make sure the cause of your pain is not dangerous. If your caregiver needs more information, he or she may order lab work or imaging tests (X-rays or MRIs).However, even if imaging tests show changes in your back, this usually does not require surgery. HOME CARE INSTRUCTIONS For many people, back pain returns.Since low back pain is rarely dangerous, it is often a condition that people can learn to [facility] their own.   Remain active. It is stressful on the back to sit or stand in one place. Do not sit, drive, or stand in one place for more than 30 minutes at a time. Take short walks on level surfaces as soon as pain allows.Try to increase the length of time you walk each day.  Do not stay in bed.Resting more than 1 or 2 days can delay your recovery.  Do not avoid exercise or work.Your body is made to move.It is not dangerous to be active, even though your back may hurt.Your back will likely heal faster if you return to  being active before your pain is gone.  Pay attention to your body when you bend and lift. Many people have less discomfortwhen lifting if they bend their knees, keep the load close to their bodies,and avoid twisting. Often, the most comfortable positions are those that put less stress on your recovering back.  Find a comfortable position to sleep. Use a firm mattress and lie on your side with your knees slightly bent. If you lie on your back, put a pillow under your knees.  Only take over-the-counter or prescription medicines as directed by your caregiver. Over-the-counter medicines to reduce pain and inflammation are often the most helpful.Your caregiver may prescribe muscle relaxant drugs.These medicines help dull your pain so you can more quickly return to your normal activities and healthy exercise.  Put ice on the injured area.  Put ice in a plastic bag.  Place a towel between your skin and the bag.  Leave the ice on for 15-20 minutes, 03-04 times a day for the first 2 to 3 days. After that, ice and heat may be alternated to reduce pain and spasms.  Ask your caregiver about trying back exercises and gentle massage. This may be of some benefit.  Avoid feeling anxious or stressed.Stress increases muscle tension and can worsen back pain.It is important to recognize when you are anxious or stressed and learn ways to manage it.Exercise is a great option. SEEK MEDICAL CARE IF:  You have pain that is not relieved with rest or medicine.  You have pain that does not improve in 1 week.  You have new symptoms.  You are generally not feeling well. SEEK IMMEDIATE MEDICAL CARE IF:   You have pain that radiates from your back into your legs.  You develop new bowel or bladder control problems.  You have unusual weakness or numbness in your arms or legs.  You develop nausea or vomiting.  You develop abdominal pain.  You feel faint. Document Released: 11/13/2005 Document Revised:  05/14/2012 Document Reviewed: 03/17/2014 [facility] Patient Information 2015 [place], [place]. This information is not intended to replace advice given to you by your health care provider. Make sure you discuss any questions you have with your health care provider.

## 2014-12-24 NOTE — Assessment & Plan Note (Signed)
Encouraged heart healthy diet, increase exercise, avoid trans fats, consider a krill oil cap daily. Repeat lipid panel today

## 2014-12-25 MED ORDER — METFORMIN HCL 500 MG PO TABS: 500.0000 mg | ORAL_TABLET | Freq: Every day | ORAL | Status: DC

## 2014-12-25 MED ORDER — MICROLET LANCETS MISC: Status: DC

## 2014-12-25 MED ORDER — GLUCOSE BLOOD VI STRP: ORAL_STRIP | Status: DC

## 2015-01-03 ENCOUNTER — Encounter: Payer: Self-pay | Admitting: Family Medicine

## 2015-01-03 NOTE — Assessment & Plan Note (Signed)
Continues to struggle but manages with minimal meds. May continue same but may need referral for further consideration if worsens.

## 2015-01-03 NOTE — Progress Notes (Signed)
Gary Short  [id number] 06-Mar-1971 01/03/2015      Progress Note-Follow Up  Subjective  Chief Complaint  Chief Complaint  Patient presents with  . Neck Pain    pt states the pain is getting worse    HPI  Patient is a 44 y.o. male in today for routine medical care. Patient is in today for follow-up. Continues to struggle with chronic pain but is managing to stay active and take minimal meds. Has low back pain as well as neck pain on a daily basis. No new or recent injury. No recent illness. Denies CP/palp/SOB/HA/congestion/fevers/GI or GU c/o. Taking meds as prescribed  Past Medical History  Diagnosis Date  . Back pain 06/13/2010    Qualifier: Diagnosis of  By: Charlett Blake MD, Sonia Baller back at work in 2002, was able to work until 2004. Ultimately had  Surgeries to his lower back twice and after complications with a spinal leak, was never able to return to work Radicular symptoms occur b/l.   . Obesity, unspecified 06/13/2010    Qualifier: Diagnosis of  By: Charlett Blake MD, Erline Levine    . Knee pain, bilateral 08/26/2014    Past Surgical History  Procedure Laterality Date  . Knee surgery  1999    cartilage repair  . Back surgery      X 2    Family History  Problem Relation Age of Onset  . Arthritis Other     family hx of  . Diabetes Other     family hx of  . Hypertension Other     family hx of  . Stroke Other     family hx of  . Sudden death Other     family hx of  . Stroke Father     History   Social History  . Marital Status: Married    Spouse Name: N/A    Number of Children: N/A  . Years of Education: N/A   Occupational History  . Not on file.   Social History Main Topics  . Smoking status: Current Every Day Smoker -- 1.00 packs/day    Types: Cigarettes  . Smokeless tobacco: Not on file  . Alcohol Use: No  . Drug Use: No  . Sexual Activity: Not on file   Other Topics Concern  . Not on file   Social History Narrative    No current outpatient  prescriptions on file prior to visit.   No current facility-administered medications on file prior to visit.    Allergies  Allergen Reactions  . Iodides     Review of Systems  Review of Systems  Constitutional: Positive for malaise/fatigue. Negative for fever.  HENT: Negative for congestion.   Eyes: Negative for discharge.  Respiratory: Negative for shortness of breath.   Cardiovascular: Negative for chest pain, palpitations and leg swelling.  Gastrointestinal: Negative for nausea, abdominal pain and diarrhea.  Genitourinary: Negative for dysuria.  Musculoskeletal: Positive for back pain and joint pain. Negative for falls.  Skin: Negative for rash.  Neurological: Negative for loss of consciousness and headaches.  Endo/Heme/Allergies: Negative for polydipsia.  Psychiatric/Behavioral: Negative for depression and suicidal ideas. The patient is nervous/anxious. The patient does not have insomnia.     Objective  BP 132/84 mmHg  Pulse 75  Temp(Src) 98 F (36.7 C) (Oral)  Ht 6' 1.5" (1.867 m)  Wt 299 lb 12.8 oz (135.988 kg)  BMI 39.01 kg/m2  SpO2 98%  Physical Exam  Physical Exam  Constitutional: He  is oriented to person, place, and time and well-developed, well-nourished, and in no distress. No distress.  HENT:  Head: Normocephalic and atraumatic.  Eyes: Conjunctivae are normal.  Neck: Neck supple. No thyromegaly present.  Cardiovascular: Normal rate, regular rhythm and normal heart sounds.   No murmur heard. Pulmonary/Chest: Effort normal and breath sounds normal. No respiratory distress.  Abdominal: He exhibits no distension and no mass. There is no tenderness.  Musculoskeletal: He exhibits no edema.  Neurological: He is alert and oriented to person, place, and time.  Skin: Skin is warm.  Psychiatric: Memory, affect and judgment normal.    Lab Results  Component Value Date   TSH 1.61 12/24/2014   Lab Results  Component Value Date   WBC 6.8 12/24/2014   HGB  16.4 12/24/2014   HCT 48.6 12/24/2014   MCV 89.7 12/24/2014   PLT 240.0 12/24/2014   Lab Results  Component Value Date   CREATININE 0.95 12/24/2014   BUN 14 12/24/2014   NA 136 12/24/2014   K 4.4 12/24/2014   CL 103 12/24/2014   CO2 25 12/24/2014   Lab Results  Component Value Date   ALT 26 12/24/2014   AST 17 12/24/2014   ALKPHOS 71 12/24/2014   BILITOT 0.5 12/24/2014   Lab Results  Component Value Date   CHOL 187 12/24/2014   Lab Results  Component Value Date   HDL 41.70 12/24/2014   Lab Results  Component Value Date   LDLCALC 113* 12/24/2014   Lab Results  Component Value Date   TRIG 162.0* 12/24/2014   Lab Results  Component Value Date   CHOLHDL 4 12/24/2014     Assessment & Plan  Diabetes mellitus type 2 in obese hgba1c acceptable, minimize simple carbs. Increase exercise as tolerated. Continue current meds. Repeat hgba1c today   MIXED HYPERLIPIDEMIA Encouraged heart healthy diet, increase exercise, avoid trans fats, consider a krill oil cap daily. Repeat lipid panel today   ELEVATED BLOOD PRESSURE WITHOUT DIAGNOSIS OF HYPERTENSION Well controlled. Encouraged heart healthy diet such as the DASH diet and exercise as tolerated.    Obesity Encouraged DASH diet, decrease po intake and increase exercise as tolerated. Needs 7-8 hours of sleep nightly. Avoid trans fats, eat small, frequent meals every 4-5 hours with lean proteins, complex carbs and healthy fats. Minimize simple carbs, GMO foods.   Back pain Continues to struggle but manages with minimal meds. May continue same but may need referral for further consideration if worsens.

## 2015-01-03 NOTE — Assessment & Plan Note (Signed)
Encouraged DASH diet, decrease po intake and increase exercise as tolerated. Needs 7-8 hours of sleep nightly. Avoid trans fats, eat small, frequent meals every 4-5 hours with lean proteins, complex carbs and healthy fats. Minimize simple carbs, GMO foods. 

## 2015-01-20 ENCOUNTER — Encounter: Payer: Self-pay | Admitting: Family Medicine

## 2015-02-23 ENCOUNTER — Ambulatory Visit: Payer: Medicare Other | Admitting: Family Medicine

## 2015-03-16 ENCOUNTER — Ambulatory Visit: Payer: Medicare Other | Admitting: Family Medicine

## 2015-04-27 ENCOUNTER — Ambulatory Visit: Payer: Medicare Other | Admitting: Family Medicine

## 2015-10-25 ENCOUNTER — Ambulatory Visit: Payer: Medicare Other | Admitting: Family Medicine

## 2015-10-26 ENCOUNTER — Encounter: Payer: Self-pay | Admitting: Family Medicine

## 2015-10-26 ENCOUNTER — Ambulatory Visit (INDEPENDENT_AMBULATORY_CARE_PROVIDER_SITE_OTHER): Payer: Medicare Other | Admitting: Family Medicine

## 2015-10-26 VITALS — BP 130/82 | HR 76 | Temp 97.7°F | Ht 76.0 in | Wt 300.4 lb

## 2015-10-26 DIAGNOSIS — M546 Pain in thoracic spine: Secondary | ICD-10-CM

## 2015-10-26 DIAGNOSIS — E1169 Type 2 diabetes mellitus with other specified complication: Secondary | ICD-10-CM

## 2015-10-26 DIAGNOSIS — M542 Cervicalgia: Secondary | ICD-10-CM

## 2015-10-26 DIAGNOSIS — M25551 Pain in right hip: Secondary | ICD-10-CM

## 2015-10-26 DIAGNOSIS — E119 Type 2 diabetes mellitus without complications: Secondary | ICD-10-CM

## 2015-10-26 DIAGNOSIS — Z23 Encounter for immunization: Secondary | ICD-10-CM

## 2015-10-26 DIAGNOSIS — E782 Mixed hyperlipidemia: Secondary | ICD-10-CM

## 2015-10-26 DIAGNOSIS — E669 Obesity, unspecified: Secondary | ICD-10-CM

## 2015-10-26 DIAGNOSIS — R030 Elevated blood-pressure reading, without diagnosis of hypertension: Secondary | ICD-10-CM

## 2015-10-26 MED ORDER — CARISOPRODOL 350 MG PO TABS: 350.0000 mg | ORAL_TABLET | Freq: Three times a day (TID) | ORAL | Status: DC | PRN

## 2015-10-26 MED ORDER — HYDROCODONE-IBUPROFEN 7.5-200 MG PO TABS: 1.0000 | ORAL_TABLET | Freq: Three times a day (TID) | ORAL | Status: DC | PRN

## 2015-10-26 NOTE — Progress Notes (Signed)
Pre visit review using our clinic review tool, if applicable. No additional management support is needed unless otherwise documented below in the visit note. 

## 2015-10-26 NOTE — Patient Instructions (Signed)
Flaxseed oil caps    Basic Carbohydrate Counting for Diabetes Mellitus Carbohydrate counting is a method for keeping track of the amount of carbohydrates you eat. Eating carbohydrates naturally increases the level of sugar (glucose) in your blood, so it is important for you to know the amount that is okay for you to have in every meal. Carbohydrate counting helps keep the level of glucose in your blood within normal limits. The amount of carbohydrates allowed is different for every person. A dietitian can help you calculate the amount that is right for you. Once you know the amount of carbohydrates you can have, you can count the carbohydrates in the foods you want to eat. Carbohydrates are found in the following foods:  Grains, such as breads and cereals.  Dried beans and soy products.  Starchy vegetables, such as potatoes, peas, and corn.  Fruit and fruit juices.  Milk and yogurt.  Sweets and snack foods, such as cake, cookies, candy, chips, soft drinks, and fruit drinks. CARBOHYDRATE COUNTING There are two ways to count the carbohydrates in your food. You can use either of the methods or a combination of both. Reading the "Nutrition Facts" on [place] The "Nutrition Facts" is an area that is included on the labels of almost all packaged food and beverages in the [place]. It includes the serving size of that food or beverage and information about the nutrients in each serving of the food, including the grams (g) of carbohydrate per serving.  Decide the number of servings of this food or beverage that you will be able to eat or drink. Multiply that number of servings by the number of grams of carbohydrate that is listed on the label for that serving. The total will be the amount of carbohydrates you will be having when you eat or drink this food or beverage. Learning Standard Serving Sizes of Food When you eat food that is not packaged or does not include "Nutrition Facts" on the  label, you need to measure the servings in order to count the amount of carbohydrates.A serving of most carbohydrate-rich foods contains about 15 g of carbohydrates. The following list includes serving sizes of carbohydrate-rich foods that provide 15 g ofcarbohydrate per serving:   1 slice of bread (1 oz) or 1 six-inch tortilla.    of a hamburger bun or English muffin.  4-6 crackers.   cup unsweetened dry cereal.    cup hot cereal.   cup rice or pasta.    cup mashed potatoes or  of a large baked potato.  1 cup fresh fruit or one small piece of fruit.    cup canned or frozen fruit or fruit juice.  1 cup milk.   cup plain fat-free yogurt or yogurt sweetened with artificial sweeteners.   cup cooked dried beans or starchy vegetable, such as peas, corn, or potatoes.  Decide the number of standard-size servings that you will eat. Multiply that number of servings by 15 (the grams of carbohydrates in that serving). For example, if you eat 2 cups of strawberries, you will have eaten 2 servings and 30 g of carbohydrates (2 servings x 15 g = 30 g). For foods such as soups and casseroles, in which more than one food is mixed in, you will need to count the carbohydrates in each food that is included. EXAMPLE OF CARBOHYDRATE COUNTING Sample Dinner  3 oz chicken breast.   cup of brown rice.   cup of corn.  1 cup milk.  1 cup strawberries with sugar-free whipped topping.  Carbohydrate Calculation Step 1: Identify the foods that contain carbohydrates:   Rice.   Corn.   Milk.   Strawberries. Step 2:Calculate the number of servings eaten of each:   2 servings of rice.   1 serving of corn.   1 serving of milk.   1 serving of strawberries. Step 3: Multiply each of those number of servings by 15 g:   2 servings of rice x 15 g = 30 g.   1 serving of corn x 15 g = 15 g.   1 serving of milk x 15 g = 15 g.   1 serving of strawberries x 15 g = 15  g. Step 4: Add together all of the amounts to find the total grams of carbohydrates eaten: 30 g + 15 g + 15 g + 15 g = 75 g.   This information is not intended to replace advice given to you by your health care provider. Make sure you discuss any questions you have with your health care provider.   Document Released: 11/13/2005 Document Revised: 12/04/2014 Document Reviewed: 10/10/2013 Elsevier Interactive Patient Education [employer].

## 2015-10-27 ENCOUNTER — Encounter: Payer: Self-pay | Admitting: Family Medicine

## 2015-10-27 LAB — LIPID PANEL
Cholesterol: 170 mg/dL (ref 0–200)
HDL: 35.7 mg/dL — AB (ref >39.00)
NONHDL: 134.74
TRIGLYCERIDES: 248 mg/dL — AB (ref 0.0–149.0)
Total CHOL/HDL Ratio: 5
VLDL: 49.6 mg/dL — ABNORMAL HIGH (ref 0.0–40.0)

## 2015-10-27 LAB — HEMOGLOBIN A1C: Hgb A1c MFr Bld: 7 % — ABNORMAL HIGH (ref 4.6–6.5)

## 2015-10-27 LAB — CBC
HEMATOCRIT: 48.2 % (ref 39.0–52.0)
Hemoglobin: 15.8 g/dL (ref 13.0–17.0)
MCHC: 32.8 g/dL (ref 30.0–36.0)
MCV: 92.6 fl (ref 78.0–100.0)
Platelets: 220 10*3/uL (ref 150.0–400.0)
RBC: 5.21 Mil/uL (ref 4.22–5.81)
RDW: 14.2 % (ref 11.5–15.5)
WBC: 11 10*3/uL — AB (ref 4.0–10.5)

## 2015-10-27 LAB — COMPREHENSIVE METABOLIC PANEL
ALT: 28 U/L (ref 0–53)
AST: 18 U/L (ref 0–37)
Albumin: 4 g/dL (ref 3.5–5.2)
Alkaline Phosphatase: 66 U/L (ref 39–117)
BUN: 12 mg/dL (ref 6–23)
CO2: 31 meq/L (ref 19–32)
Calcium: 9.1 mg/dL (ref 8.4–10.5)
Chloride: 100 mEq/L (ref 96–112)
Creatinine, Ser: 1.07 mg/dL (ref 0.40–1.50)
GFR: 79.76 mL/min (ref >60.00)
GLUCOSE: 131 mg/dL — AB (ref 70–99)
POTASSIUM: 4.2 meq/L (ref 3.5–5.1)
Sodium: 136 mEq/L (ref 135–145)
Total Bilirubin: 0.3 mg/dL (ref 0.2–1.2)
Total Protein: 6.8 g/dL (ref 6.0–8.3)

## 2015-10-27 LAB — MICROALBUMIN / CREATININE URINE RATIO
Creatinine,U: 139.1 mg/dL
MICROALB/CREAT RATIO: 0.5 mg/g (ref 0.0–30.0)
Microalb, Ur: <0.7 mg/dL (ref 0.0–1.9)

## 2015-10-27 LAB — LDL CHOLESTEROL, DIRECT: LDL DIRECT: 103 mg/dL

## 2015-10-27 LAB — TSH: TSH: 1.83 u[IU]/mL (ref 0.35–4.50)

## 2015-10-30 NOTE — Progress Notes (Signed)
Subjective:    Patient ID: Gary Short., male    DOB: 1971/07/26, 44 y.o.   MRN: [id number]  Chief Complaint  Patient presents with  . Follow-up    HPI Patient is in today for follow-up. His greatest complaint is low back pain and right hip pain. He has radicular symptoms of pain and weakness down the right leg. No incontinence. Does note some urinary frequencyc. No trauma. No recent illness. Denies CP/palp/SOB/HA/congestion/fevers/GI or GU c/o. Taking meds as prescribed  Past Medical History  Diagnosis Date  . Back pain 06/13/2010    Qualifier: Diagnosis of  By: Charlett Blake MD, Sonia Baller back at work in 2002, was able to work until 2004. Ultimately had  Surgeries to his lower back twice and after complications with a spinal leak, was never able to return to work Radicular symptoms occur b/l.   . Obesity, unspecified 06/13/2010    Qualifier: Diagnosis of  By: Charlett Blake MD, Erline Levine    . Knee pain, bilateral 08/26/2014    Past Surgical History  Procedure Laterality Date  . Knee surgery  1999    cartilage repair  . Back surgery      X 2    Family History  Problem Relation Age of Onset  . Arthritis Other     family hx of  . Diabetes Other     family hx of  . Hypertension Other     family hx of  . Stroke Other     family hx of  . Sudden death Other     family hx of  . Stroke Father     Social History   Social History  . Marital Status: Married    Spouse Name: N/A  . Number of Children: N/A  . Years of Education: N/A   Occupational History  . Not on file.   Social History Main Topics  . Smoking status: Current Every Day Smoker -- 1.00 packs/day    Types: Cigarettes  . Smokeless tobacco: Not on file  . Alcohol Use: No  . Drug Use: No  . Sexual Activity: Not on file   Other Topics Concern  . Not on file   Social History Narrative    Outpatient Prescriptions Prior to Visit  Medication Sig Dispense Refill  . carisoprodol (SOMA) 350 MG tablet Take 1 tablet (350  mg total) by mouth 3 (three) times daily as needed for muscle spasms. 90 tablet 4  . HYDROcodone-ibuprofen (VICOPROFEN) 7.5-200 MG per tablet Take 1 tablet by mouth every 8 (eight) hours as needed. 60 tablet 0  . metFORMIN (GLUCOPHAGE) 500 MG tablet Take 1 tablet (500 mg total) by mouth daily with breakfast. 30 tablet 3  . glucose blood (BAYER CONTOUR TEST) test strip Check blood glucose once daily and as needed. Dx: 250.00, HGBA1C level: 7.0% (Patient not taking: Reported on 10/26/2015) 100 each 5  . MICROLET LANCETS MISC Check blood glucose once daily and as needed. Dx: 250.00, HGBA1C level: 7.0% (Patient not taking: Reported on 10/26/2015) 100 each 5   No facility-administered medications prior to visit.    Allergies  Allergen Reactions  . Iodides     Review of Systems  Constitutional: Negative for fever and malaise/fatigue.  HENT: Negative for congestion.   Eyes: Negative for discharge.  Respiratory: Negative for shortness of breath.   Cardiovascular: Negative for chest pain, palpitations and leg swelling.  Gastrointestinal: Negative for nausea and abdominal pain.  Genitourinary: Negative for dysuria.  Musculoskeletal: Positive  for back pain and joint pain. Negative for falls.  Skin: Negative for rash.  Neurological: Negative for loss of consciousness and headaches.  Endo/Heme/Allergies: Negative for environmental allergies.  Psychiatric/Behavioral: Negative for depression. The patient is not nervous/anxious.        Objective:    Physical Exam  Constitutional: He is oriented to person, place, and time. He appears well-developed and well-nourished. No distress.  HENT:  Head: Normocephalic and atraumatic.  Nose: Nose normal.  Eyes: Right eye exhibits no discharge. Left eye exhibits no discharge.  Neck: Normal range of motion. Neck supple.  Cardiovascular: Normal rate and regular rhythm.   No murmur heard. Pulmonary/Chest: Effort normal and breath sounds normal.  Abdominal:  Soft. Bowel sounds are normal. There is no tenderness.  Musculoskeletal: He exhibits no edema.  Neurological: He is alert and oriented to person, place, and time.  Skin: Skin is warm and dry.  Psychiatric: He has a normal mood and affect.  Nursing note and vitals reviewed.   BP 130/82 mmHg  Pulse 76  Temp(Src) 97.7 F (36.5 C) (Oral)  Ht 6\' 4"  (1.93 m)  Wt 300 lb 6 oz (136.249 kg)  BMI 36.58 kg/m2  SpO2 98% Wt Readings from Last 3 Encounters:  10/26/15 300 lb 6 oz (136.249 kg)  12/24/14 299 lb 12.8 oz (135.988 kg)  08/25/14 292 lb 3.2 oz (132.541 kg)     Lab Results  Component Value Date   WBC 11.0* 10/26/2015   HGB 15.8 10/26/2015   HCT 48.2 10/26/2015   PLT 220.0 10/26/2015   GLUCOSE 131* 10/26/2015   CHOL 170 10/26/2015   TRIG 248.0* 10/26/2015   HDL 35.70* 10/26/2015   LDLDIRECT 103.0 10/26/2015   LDLCALC 113* 12/24/2014   ALT 28 10/26/2015   AST 18 10/26/2015   NA 136 10/26/2015   K 4.2 10/26/2015   CL 100 10/26/2015   CREATININE 1.07 10/26/2015   BUN 12 10/26/2015   CO2 31 10/26/2015   TSH 1.83 10/26/2015   HGBA1C 7.0* 10/26/2015   MICROALBUR <0.7 10/26/2015    Lab Results  Component Value Date   TSH 1.83 10/26/2015   Lab Results  Component Value Date   WBC 11.0* 10/26/2015   HGB 15.8 10/26/2015   HCT 48.2 10/26/2015   MCV 92.6 10/26/2015   PLT 220.0 10/26/2015   Lab Results  Component Value Date   NA 136 10/26/2015   K 4.2 10/26/2015   CO2 31 10/26/2015   GLUCOSE 131* 10/26/2015   BUN 12 10/26/2015   CREATININE 1.07 10/26/2015   BILITOT 0.3 10/26/2015   ALKPHOS 66 10/26/2015   AST 18 10/26/2015   ALT 28 10/26/2015   PROT 6.8 10/26/2015   ALBUMIN 4.0 10/26/2015   CALCIUM 9.1 10/26/2015   GFR 79.76 10/26/2015   Lab Results  Component Value Date   CHOL 170 10/26/2015   Lab Results  Component Value Date   HDL 35.70* 10/26/2015   Lab Results  Component Value Date   LDLCALC 113* 12/24/2014   Lab Results  Component Value Date     TRIG 248.0* 10/26/2015   Lab Results  Component Value Date   CHOLHDL 5 10/26/2015   Lab Results  Component Value Date   HGBA1C 7.0* 10/26/2015       Assessment & Plan:   Problem List Items Addressed This Visit    Back pain    Hurt back at work in 2002, was able to work until 2004. Ultimately had  Surgeries to his  lower back twice and after complications with a spinal leak, was never able to return to work Radicular symptoms ocur b/l. Worsening symptoms       Relevant Medications   HYDROcodone-ibuprofen (VICOPROFEN) 7.5-200 MG tablet   carisoprodol (SOMA) 350 MG tablet   Diabetes mellitus type 2 in obese (HCC)    hgba1c acceptable, minimize simple carbs. Increase exercise as tolerated. Continue current meds      Relevant Orders   CBC (Completed)   TSH (Completed)   Lipid panel (Completed)   Comprehensive metabolic panel (Completed)   Hemoglobin A1c (Completed)   Microalbumin / creatinine urine ratio (Completed)   ELEVATED BLOOD PRESSURE WITHOUT DIAGNOSIS OF HYPERTENSION    Well controlled, no changes. Encouraged heart healthy diet such as the DASH diet and exercise as tolerated.       Relevant Orders   CBC (Completed)   TSH (Completed)   Lipid panel (Completed)   Comprehensive metabolic panel (Completed)   Hemoglobin A1c (Completed)   Microalbumin / creatinine urine ratio (Completed)   MIXED HYPERLIPIDEMIA   Relevant Orders   CBC (Completed)   TSH (Completed)   Lipid panel (Completed)   Comprehensive metabolic panel (Completed)   Hemoglobin A1c (Completed)   Microalbumin / creatinine urine ratio (Completed)   Obesity    Encouraged DASH diet, decrease po intake and increase exercise as tolerated. Needs 7-8 hours of sleep nightly. Avoid trans fats, eat small, frequent meals every 4-5 hours with lean proteins, complex carbs and healthy fats. Minimize simple carbs, GMO foods.      Relevant Orders   CBC (Completed)   TSH (Completed)   Lipid panel (Completed)    Comprehensive metabolic panel (Completed)   Hemoglobin A1c (Completed)   Microalbumin / creatinine urine ratio (Completed)    Other Visit Diagnoses    Encounter for immunization    -  Primary    Neck pain        Relevant Medications    HYDROcodone-ibuprofen (VICOPROFEN) 7.5-200 MG tablet    Hip pain, right        Relevant Orders    Ambulatory referral to Orthopedic Surgery       I have discontinued Mr. Hover's metFORMIN. I have also changed his HYDROcodone-ibuprofen. Additionally, I am having him maintain his MICROLET LANCETS, glucose blood, and carisoprodol.  Meds ordered this encounter  Medications  . HYDROcodone-ibuprofen (VICOPROFEN) 7.5-200 MG tablet    Sig: Take 1 tablet by mouth every 8 (eight) hours as needed.    Dispense:  60 tablet    Refill:  0  . carisoprodol (SOMA) 350 MG tablet    Sig: Take 1 tablet (350 mg total) by mouth 3 (three) times daily as needed for muscle spasms.    Dispense:  90 tablet    Refill:  5     Penni Homans, MD

## 2015-10-30 NOTE — Assessment & Plan Note (Signed)
hgba1c acceptable, minimize simple carbs. Increase exercise as tolerated. Continue current meds 

## 2015-10-30 NOTE — Assessment & Plan Note (Signed)
Right hip pain becoming intolerable. Referred to orthopaedics and given refills on meds.

## 2015-10-30 NOTE — Assessment & Plan Note (Signed)
Hurt back at work in 2002, was able to work until 2004. Ultimately had  Surgeries to his lower back twice and after complications with a spinal leak, was never able to return to work Radicular symptoms ocur b/l. Worsening symptoms

## 2015-10-30 NOTE — Assessment & Plan Note (Signed)
Encouraged DASH diet, decrease po intake and increase exercise as tolerated. Needs 7-8 hours of sleep nightly. Avoid trans fats, eat small, frequent meals every 4-5 hours with lean proteins, complex carbs and healthy fats. Minimize simple carbs, GMO foods. 

## 2015-10-30 NOTE — Assessment & Plan Note (Signed)
Well controlled, no changes. Encouraged heart healthy diet such as the DASH diet and exercise as tolerated.  

## 2015-11-01 ENCOUNTER — Telehealth: Payer: Self-pay | Admitting: Family Medicine

## 2015-11-01 MED ORDER — LISINOPRIL 5 MG PO TABS: 5.0000 mg | ORAL_TABLET | Freq: Every day | ORAL | Status: DC

## 2015-11-01 NOTE — Telephone Encounter (Signed)
-----   Message from [name], MD sent at 10/30/2015  5:54 PM EST ----- Looking over his chart I would like him to start Lisinopril 5 mg po daily to protect his kidneys from his sugar. Disp #30 with 5 rf if he agrees.

## 2015-11-01 NOTE — Telephone Encounter (Signed)
Called the patient informed and did agree to start new medication.

## 2015-11-05 NOTE — Telephone Encounter (Signed)
Wife informed of instructions.

## 2016-01-10 ENCOUNTER — Telehealth: Payer: Self-pay | Admitting: Family Medicine

## 2016-01-10 NOTE — Telephone Encounter (Signed)
Pt wife needing information to fill out a form about last 3 office visits and what was discussed. OK to call pt cell. If he doesn't answer please call Apolonio Schneiders.

## 2016-01-10 NOTE — Telephone Encounter (Signed)
Patients wife stated has PCP noted she believes he cannot work due to back/hip pain. They are completing a form.  They need to know if all in agreement he cannot work.

## 2016-01-10 NOTE — Telephone Encounter (Signed)
pateints wife informed of PCP agreement and at this time that was all that had need of.

## 2016-01-10 NOTE — Telephone Encounter (Signed)
I agree his pain is severe enough to warrant not working, is that the question? OK to release requested info

## 2016-01-25 DIAGNOSIS — R05 Cough: Secondary | ICD-10-CM | POA: Diagnosis not present

## 2016-01-25 DIAGNOSIS — Z79899 Other long term (current) drug therapy: Secondary | ICD-10-CM | POA: Diagnosis not present

## 2016-01-25 DIAGNOSIS — J9801 Acute bronchospasm: Secondary | ICD-10-CM | POA: Diagnosis not present

## 2016-01-25 DIAGNOSIS — K760 Fatty (change of) liver, not elsewhere classified: Secondary | ICD-10-CM | POA: Diagnosis not present

## 2016-01-25 DIAGNOSIS — S301XXA Contusion of abdominal wall, initial encounter: Secondary | ICD-10-CM | POA: Diagnosis not present

## 2016-01-25 DIAGNOSIS — D3500 Benign neoplasm of unspecified adrenal gland: Secondary | ICD-10-CM | POA: Diagnosis not present

## 2016-02-02 ENCOUNTER — Encounter: Payer: Self-pay | Admitting: Family Medicine

## 2016-02-02 ENCOUNTER — Telehealth: Payer: Self-pay | Admitting: Family Medicine

## 2016-02-02 NOTE — Telephone Encounter (Signed)
Caller name: Apolonio Schneiders  Relation to pt: spouse  Call back number: [phone]   Reason for call:  Spouse would like to discuss  metFORMIN (GLUCOPHAGE) 500 MG tablet due to patient BS increase. Spouse states patient is exp. Head aches.

## 2016-02-02 NOTE — Telephone Encounter (Signed)
Error:[id number] ° °

## 2016-02-02 NOTE — Telephone Encounter (Signed)
BS in the mornings is around 150 and by the end of the day between 275 to 300. Yesterday, last night around 9 BS was 400. 2 weeks ago frequent headaches, nothing OTC not helping, having vision problems.  Patient was on doxycycline one week ago due to bronchitis and albuterol inhaler.  Patient was not consistently taking the metformin and started back around 2 weeks ago once patient realized blood sugar elevated.  He has only 3 left at this time.  Currently Metformin is not on his current list, but historical.

## 2016-02-02 NOTE — Telephone Encounter (Signed)
Please send him in a prescription for Metformin 500 mg 1 tab po bid, disp #60 with 2 rf and let him know I have increased his prescription so he can take 2 a day instead of one since he has been running high

## 2016-02-03 ENCOUNTER — Other Ambulatory Visit: Payer: Self-pay | Admitting: Family Medicine

## 2016-02-03 ENCOUNTER — Encounter: Payer: Self-pay | Admitting: Family Medicine

## 2016-02-03 DIAGNOSIS — R519 Headache, unspecified: Secondary | ICD-10-CM

## 2016-02-03 DIAGNOSIS — R51 Headache: Principal | ICD-10-CM

## 2016-02-03 MED ORDER — METFORMIN HCL 500 MG PO TABS: 500.0000 mg | ORAL_TABLET | Freq: Two times a day (BID) | ORAL | Status: DC

## 2016-02-03 NOTE — Telephone Encounter (Signed)
Encourage increased hydration, 64 ounces of clear fluids daily. Minimize alcohol and caffeine. Eat small frequent meals with lean proteins and complex carbs. Avoid high and low blood sugars. Get adequate sleep, 7-8 hours a night. Needs exercise daily preferably in the morning.

## 2016-02-03 NOTE — Telephone Encounter (Signed)
Patients wife informed of PCP instructions and agreed to do her best to inform patient of all information. Please do refer to [facility].

## 2016-02-03 NOTE — Telephone Encounter (Signed)
Patient wife informed of increase in metformin and sent to pharmacy. She would like to know what to do regarding his headaches????

## 2016-04-27 ENCOUNTER — Ambulatory Visit (INDEPENDENT_AMBULATORY_CARE_PROVIDER_SITE_OTHER): Payer: Medicare Other | Admitting: Family Medicine

## 2016-04-27 ENCOUNTER — Encounter: Payer: Self-pay | Admitting: Family Medicine

## 2016-04-27 VITALS — BP 117/82 | HR 65 | Temp 97.7°F | Ht 74.0 in | Wt 289.2 lb

## 2016-04-27 DIAGNOSIS — M542 Cervicalgia: Secondary | ICD-10-CM | POA: Diagnosis not present

## 2016-04-27 DIAGNOSIS — E1169 Type 2 diabetes mellitus with other specified complication: Secondary | ICD-10-CM

## 2016-04-27 DIAGNOSIS — M545 Low back pain, unspecified: Secondary | ICD-10-CM

## 2016-04-27 DIAGNOSIS — Z0000 Encounter for general adult medical examination without abnormal findings: Secondary | ICD-10-CM

## 2016-04-27 DIAGNOSIS — M546 Pain in thoracic spine: Secondary | ICD-10-CM | POA: Diagnosis not present

## 2016-04-27 DIAGNOSIS — E119 Type 2 diabetes mellitus without complications: Secondary | ICD-10-CM

## 2016-04-27 DIAGNOSIS — R030 Elevated blood-pressure reading, without diagnosis of hypertension: Secondary | ICD-10-CM

## 2016-04-27 DIAGNOSIS — E782 Mixed hyperlipidemia: Secondary | ICD-10-CM

## 2016-04-27 DIAGNOSIS — Z23 Encounter for immunization: Secondary | ICD-10-CM

## 2016-04-27 DIAGNOSIS — E669 Obesity, unspecified: Secondary | ICD-10-CM

## 2016-04-27 MED ORDER — TIZANIDINE HCL 4 MG PO TABS: 4.0000 mg | ORAL_TABLET | Freq: Four times a day (QID) | ORAL | Status: DC | PRN

## 2016-04-27 MED ORDER — MICROLET LANCETS MISC: Status: DC

## 2016-04-27 MED ORDER — HYDROCODONE-IBUPROFEN 7.5-200 MG PO TABS: 1.0000 | ORAL_TABLET | Freq: Three times a day (TID) | ORAL | Status: DC | PRN

## 2016-04-27 MED ORDER — METFORMIN HCL 500 MG PO TABS: 500.0000 mg | ORAL_TABLET | Freq: Two times a day (BID) | ORAL | Status: DC

## 2016-04-27 MED ORDER — GLUCOSE BLOOD VI STRP: ORAL_STRIP | Status: AC

## 2016-04-27 NOTE — Progress Notes (Signed)
Pre visit review using our clinic review tool, if applicable. No additional management support is needed unless otherwise documented below in the visit note. 

## 2016-04-27 NOTE — Patient Instructions (Addendum)
Lidocaine patches daily   Preventive Care for Adults, Male A healthy lifestyle and preventive care can promote health and wellness. Preventive health guidelines for men include the following key practices:  A routine yearly physical is a good way to check with your health care provider about your health and preventative screening. It is a chance to share any concerns and updates on your health and to receive a thorough exam.  Visit your dentist for a routine exam and preventative care every 6 months. Brush your teeth twice a day and floss once a day. Good oral hygiene prevents tooth decay and gum disease.  The frequency of eye exams is based on your age, health, family medical history, use of contact lenses, and other factors. Follow your health care provider's recommendations for frequency of eye exams.  Eat a healthy diet. Foods such as vegetables, fruits, whole grains, low-fat dairy products, and lean protein foods contain the nutrients you need without too many calories. Decrease your intake of foods high in solid fats, added sugars, and salt. Eat the right amount of calories for you.Get information about a proper diet from your health care provider, if necessary.  Regular physical exercise is one of the most important things you can do for your health. Most adults should get at least 150 minutes of moderate-intensity exercise (any activity that increases your heart rate and causes you to sweat) each week. In addition, most adults need muscle-strengthening exercises on 2 or more days a week.  Maintain a healthy weight. The body mass index (BMI) is a screening tool to identify possible weight problems. It provides an estimate of body fat based on height and weight. Your health care provider can find your BMI and can help you achieve or maintain a healthy weight.For adults 20 years and older:  A BMI below 18.5 is considered underweight.  A BMI of 18.5 to 24.9 is normal.  A BMI of 25 to 29.9  is considered overweight.  A BMI of 30 and above is considered obese.  Maintain normal blood lipids and cholesterol levels by exercising and minimizing your intake of saturated fat. Eat a balanced diet with plenty of fruit and vegetables. Blood tests for lipids and cholesterol should begin at age 60 and be repeated every 5 years. If your lipid or cholesterol levels are high, you are over 50, or you are at high risk for heart disease, you may need your cholesterol levels checked more frequently.Ongoing high lipid and cholesterol levels should be treated with medicines if diet and exercise are not working.  If you smoke, find out from your health care provider how to quit. If you do not use tobacco, do not start.  Lung cancer screening is recommended for adults aged 26-80 years who are at high risk for developing lung cancer because of a history of smoking. A yearly low-dose CT scan of the lungs is recommended for people who have at least a 30-pack-year history of smoking and are a current smoker or have quit within the past 15 years. A pack year of smoking is smoking an average of 1 pack of cigarettes a day for 1 year (for example: 1 pack a day for 30 years or 2 packs a day for 15 years). Yearly screening should continue until the smoker has stopped smoking for at least 15 years. Yearly screening should be stopped for people who develop a health problem that would prevent them from having lung cancer treatment.  If you choose to drink  alcohol, do not have more than 2 drinks per day. One drink is considered to be 12 ounces (355 mL) of beer, 5 ounces (148 mL) of wine, or 1.5 ounces (44 mL) of liquor.  Avoid use of street drugs. Do not share needles with anyone. Ask for help if you need support or instructions about stopping the use of drugs.  High blood pressure causes heart disease and increases the risk of stroke. Your blood pressure should be checked at least every 1-2 years. Ongoing high blood  pressure should be treated with medicines, if weight loss and exercise are not effective.  If you are 26-53 years old, ask your health care provider if you should take aspirin to prevent heart disease.  Diabetes screening is done by taking a blood sample to check your blood glucose level after you have not eaten for a certain period of time (fasting). If you are not overweight and you do not have risk factors for diabetes, you should be screened once every 3 years starting at age 65. If you are overweight or obese and you are 62-86 years of age, you should be screened for diabetes every year as part of your cardiovascular risk assessment.  Colorectal cancer can be detected and often prevented. Most routine colorectal cancer screening begins at the age of 18 and continues through age 1. However, your health care provider may recommend screening at an earlier age if you have risk factors for colon cancer. On a yearly basis, your health care provider may provide home test kits to check for hidden blood in the stool. Use of a small camera at the end of a tube to directly examine the colon (sigmoidoscopy or colonoscopy) can detect the earliest forms of colorectal cancer. Talk to your health care provider about this at age 16, when routine screening begins. Direct exam of the colon should be repeated every 5-10 years through age 54, unless early forms of precancerous polyps or small growths are found.  People who are at an increased risk for hepatitis B should be screened for this virus. You are considered at high risk for hepatitis B if:  You were born in a country where hepatitis B occurs often. Talk with your health care provider about which countries are considered high risk.  Your parents were born in a high-risk country and you have not received a shot to protect against hepatitis B (hepatitis B vaccine).  You have HIV or AIDS.  You use needles to inject street drugs.  You live with, or have sex  with, someone who has hepatitis B.  You are a man who has sex with other men (MSM).  You get hemodialysis treatment.  You take certain medicines for conditions such as cancer, organ transplantation, and autoimmune conditions.  Hepatitis C blood testing is recommended for all people born from [birth year] through 1965 and any individual with known risks for hepatitis C.  Practice safe sex. Use condoms and avoid high-risk sexual practices to reduce the spread of sexually transmitted infections (STIs). STIs include gonorrhea, chlamydia, syphilis, trichomonas, herpes, HPV, and human immunodeficiency virus (HIV). Herpes, HIV, and HPV are viral illnesses that have no cure. They can result in disability, cancer, and death.  If you are a man who has sex with other men, you should be screened at least once per year for:  HIV.  Urethral, rectal, and pharyngeal infection of gonorrhea, chlamydia, or both.  If you are at risk of being infected with HIV, it is  recommended that you take a prescription medicine daily to prevent HIV infection. This is called preexposure prophylaxis (PrEP). You are considered at risk if:  You are a man who has sex with other men (MSM) and have other risk factors.  You are a heterosexual man, are sexually active, and are at increased risk for HIV infection.  You take drugs by injection.  You are sexually active with a partner who has HIV.  Talk with your health care provider about whether you are at high risk of being infected with HIV. If you choose to begin PrEP, you should first be tested for HIV. You should then be tested every 3 months for as long as you are taking PrEP.  A one-time screening for abdominal aortic aneurysm (AAA) and surgical repair of large AAAs by ultrasound are recommended for men ages 64 to 72 years who are current or former smokers.  Healthy men should no longer receive prostate-specific antigen (PSA) blood tests as part of routine cancer screening.  Talk with your health care provider about prostate cancer screening.  Testicular cancer screening is not recommended for adult males who have no symptoms. Screening includes self-exam, a health care provider exam, and other screening tests. Consult with your health care provider about any symptoms you have or any concerns you have about testicular cancer.  Use sunscreen. Apply sunscreen liberally and repeatedly throughout the day. You should seek shade when your shadow is shorter than you. Protect yourself by wearing long sleeves, pants, a wide-brimmed hat, and sunglasses year round, whenever you are outdoors.  Once a month, do a whole-body skin exam, using a mirror to look at the skin on your back. Tell your health care provider about new moles, moles that have irregular borders, moles that are larger than a pencil eraser, or moles that have changed in shape or color.  Stay current with required vaccines (immunizations).  Influenza vaccine. All adults should be immunized every year.  Tetanus, diphtheria, and acellular pertussis (Td, Tdap) vaccine. An adult who has not previously received Tdap or who does not know his vaccine status should receive 1 dose of Tdap. This initial dose should be followed by tetanus and diphtheria toxoids (Td) booster doses every 10 years. Adults with an unknown or incomplete history of completing a 3-dose immunization series with Td-containing vaccines should begin or complete a primary immunization series including a Tdap dose. Adults should receive a Td booster every 10 years.  Varicella vaccine. An adult without evidence of immunity to varicella should receive 2 doses or a second dose if he has previously received 1 dose.  Human papillomavirus (HPV) vaccine. Males aged 11-21 years who have not received the vaccine previously should receive the 3-dose series. Males aged 22-26 years may be immunized. Immunization is recommended through the age of 53 years for any male  who has sex with males and did not get any or all doses earlier. Immunization is recommended for any person with an immunocompromised condition through the age of 2 years if he did not get any or all doses earlier. During the 3-dose series, the second dose should be obtained 4-8 weeks after the first dose. The third dose should be obtained 24 weeks after the first dose and 16 weeks after the second dose.  Zoster vaccine. One dose is recommended for adults aged 59 years or older unless certain conditions are present.  Measles, mumps, and rubella (MMR) vaccine. Adults born before [birth year] generally are considered immune to measles and  mumps. Adults born in [birth year] or later should have 1 or more doses of MMR vaccine unless there is a contraindication to the vaccine or there is laboratory evidence of immunity to each of the three diseases. A routine second dose of MMR vaccine should be obtained at least 28 days after the first dose for students attending postsecondary schools, health care workers, or international travelers. People who received inactivated measles vaccine or an unknown type of measles vaccine during 1963-1967 should receive 2 doses of MMR vaccine. People who received inactivated mumps vaccine or an unknown type of mumps vaccine before 1979 and are at high risk for mumps infection should consider immunization with 2 doses of MMR vaccine. Unvaccinated health care workers born before [birth year] who lack laboratory evidence of measles, mumps, or rubella immunity or laboratory confirmation of disease should consider measles and mumps immunization with 2 doses of MMR vaccine or rubella immunization with 1 dose of MMR vaccine.  Pneumococcal 13-valent conjugate (PCV13) vaccine. When indicated, a person who is uncertain of his immunization history and has no record of immunization should receive the PCV13 vaccine. All adults 24 years of age and older should receive this vaccine. An adult aged 24 years or older who has  certain medical conditions and has not been previously immunized should receive 1 dose of PCV13 vaccine. This PCV13 should be followed with a dose of pneumococcal polysaccharide (PPSV23) vaccine. Adults who are at high risk for pneumococcal disease should obtain the PPSV23 vaccine at least 8 weeks after the dose of PCV13 vaccine. Adults older than 45 years of age who have normal immune system function should obtain the PPSV23 vaccine dose at least 1 year after the dose of PCV13 vaccine.  Pneumococcal polysaccharide (PPSV23) vaccine. When PCV13 is also indicated, PCV13 should be obtained first. All adults aged 27 years and older should be immunized. An adult younger than age 68 years who has certain medical conditions should be immunized. Any person who resides in a nursing home or long-term care facility should be immunized. An adult smoker should be immunized. People with an immunocompromised condition and certain other conditions should receive both PCV13 and PPSV23 vaccines. People with human immunodeficiency virus (HIV) infection should be immunized as soon as possible after diagnosis. Immunization during chemotherapy or radiation therapy should be avoided. Routine use of PPSV23 vaccine is not recommended for American Indians, [address], or people younger than 65 years unless there are medical conditions that require PPSV23 vaccine. When indicated, people who have unknown immunization and have no record of immunization should receive PPSV23 vaccine. One-time revaccination 5 years after the first dose of PPSV23 is recommended for people aged 19-64 years who have chronic kidney failure, nephrotic syndrome, asplenia, or immunocompromised conditions. People who received 1-2 doses of PPSV23 before age 73 years should receive another dose of PPSV23 vaccine at age 39 years or later if at least 5 years have passed since the previous dose. Doses of PPSV23 are not needed for people immunized with PPSV23 at or after  age 30 years.  Meningococcal vaccine. Adults with asplenia or persistent complement component deficiencies should receive 2 doses of quadrivalent meningococcal conjugate (MenACWY-D) vaccine. The doses should be obtained at least 2 months apart. Microbiologists working with certain meningococcal bacteria, military recruits, people at risk during an outbreak, and people who travel to or live in countries with a high rate of meningitis should be immunized. A first-year college student up through age 56 years who is living in a  residence hall should receive a dose if he did not receive a dose on or after his 16th birthday. Adults who have certain high-risk conditions should receive one or more doses of vaccine.  Hepatitis A vaccine. Adults who wish to be protected from this disease, have chronic liver disease, work with hepatitis A-infected animals, work in hepatitis A research labs, or travel to or work in countries with a high rate of hepatitis A should be immunized. Adults who were previously unvaccinated and who anticipate close contact with an international adoptee during the first 60 days after arrival in the [place] from a country with a high rate of hepatitis A should be immunized.  Hepatitis B vaccine. Adults should be immunized if they wish to be protected from this disease, are under age 84 years and have diabetes, have chronic liver disease, have had more than one sex partner in the past 6 months, may be exposed to blood or other infectious body fluids, are household contacts or sex partners of hepatitis B positive people, are clients or workers in certain care facilities, or travel to or work in countries with a high rate of hepatitis B.  Haemophilus influenzae type b (Hib) vaccine. A previously unvaccinated person with asplenia or sickle cell disease or having a scheduled splenectomy should receive 1 dose of Hib vaccine. Regardless of previous immunization, a recipient of a hematopoietic stem  cell transplant should receive a 3-dose series 6-12 months after his successful transplant. Hib vaccine is not recommended for adults with HIV infection. Preventive Service / Frequency Ages 18 to 64  Blood pressure check.** / Every 3-5 years.  Lipid and cholesterol check.** / Every 5 years beginning at age 21.  Hepatitis C blood test.** / For any individual with known risks for hepatitis C.  Skin self-exam. / Monthly.  Influenza vaccine. / Every year.  Tetanus, diphtheria, and acellular pertussis (Tdap, Td) vaccine.** / Consult your health care provider. 1 dose of Td every 10 years.  Varicella vaccine.** / Consult your health care provider.  HPV vaccine. / 3 doses over 6 months, if 33 or younger.  Measles, mumps, rubella (MMR) vaccine.** / You need at least 1 dose of MMR if you were born in 1957 or later. You may also need a second dose.  Pneumococcal 13-valent conjugate (PCV13) vaccine.** / Consult your health care provider.  Pneumococcal polysaccharide (PPSV23) vaccine.** / 1 to 2 doses if you smoke cigarettes or if you have certain conditions.  Meningococcal vaccine.** / 1 dose if you are age 54 to 17 years and a Market researcher living in a residence hall, or have one of several medical conditions. You may also need additional booster doses.  Hepatitis A vaccine.** / Consult your health care provider.  Hepatitis B vaccine.** / Consult your health care provider.  Haemophilus influenzae type b (Hib) vaccine.** / Consult your health care provider. Ages 38 to 19  Blood pressure check.** / Every year.  Lipid and cholesterol check.** / Every 5 years beginning at age 107.  Lung cancer screening. / Every year if you are aged 38-80 years and have a 30-pack-year history of smoking and currently smoke or have quit within the past 15 years. Yearly screening is stopped once you have quit smoking for at least 15 years or develop a health problem that would prevent you from  having lung cancer treatment.  Fecal occult blood test (FOBT) of stool. / Every year beginning at age 34 and continuing until age 52. You  may not have to do this test if you get a colonoscopy every 10 years.  Flexible sigmoidoscopy** or colonoscopy.** / Every 5 years for a flexible sigmoidoscopy or every 10 years for a colonoscopy beginning at age 68 and continuing until age 8.  Hepatitis C blood test.** / For all people born from [birth year] through 1965 and any individual with known risks for hepatitis C.  Skin self-exam. / Monthly.  Influenza vaccine. / Every year.  Tetanus, diphtheria, and acellular pertussis (Tdap/Td) vaccine.** / Consult your health care provider. 1 dose of Td every 10 years.  Varicella vaccine.** / Consult your health care provider.  Zoster vaccine.** / 1 dose for adults aged 13 years or older.  Measles, mumps, rubella (MMR) vaccine.** / You need at least 1 dose of MMR if you were born in 1957 or later. You may also need a second dose.  Pneumococcal 13-valent conjugate (PCV13) vaccine.** / Consult your health care provider.  Pneumococcal polysaccharide (PPSV23) vaccine.** / 1 to 2 doses if you smoke cigarettes or if you have certain conditions.  Meningococcal vaccine.** / Consult your health care provider.  Hepatitis A vaccine.** / Consult your health care provider.  Hepatitis B vaccine.** / Consult your health care provider.  Haemophilus influenzae type b (Hib) vaccine.** / Consult your health care provider. Ages 63 and over  Blood pressure check.** / Every year.  Lipid and cholesterol check.**/ Every 5 years beginning at age 53.  Lung cancer screening. / Every year if you are aged 55-80 years and have a 30-pack-year history of smoking and currently smoke or have quit within the past 15 years. Yearly screening is stopped once you have quit smoking for at least 15 years or develop a health problem that would prevent you from having lung cancer  treatment.  Fecal occult blood test (FOBT) of stool. / Every year beginning at age 56 and continuing until age 79. You may not have to do this test if you get a colonoscopy every 10 years.  Flexible sigmoidoscopy** or colonoscopy.** / Every 5 years for a flexible sigmoidoscopy or every 10 years for a colonoscopy beginning at age 38 and continuing until age 82.  Hepatitis C blood test.** / For all people born from [birth year] through 1965 and any individual with known risks for hepatitis C.  Abdominal aortic aneurysm (AAA) screening.** / A one-time screening for ages 66 to 35 years who are current or former smokers.  Skin self-exam. / Monthly.  Influenza vaccine. / Every year.  Tetanus, diphtheria, and acellular pertussis (Tdap/Td) vaccine.** / 1 dose of Td every 10 years.  Varicella vaccine.** / Consult your health care provider.  Zoster vaccine.** / 1 dose for adults aged 64 years or older.  Pneumococcal 13-valent conjugate (PCV13) vaccine.** / 1 dose for all adults aged 70 years and older.  Pneumococcal polysaccharide (PPSV23) vaccine.** / 1 dose for all adults aged 34 years and older.  Meningococcal vaccine.** / Consult your health care provider.  Hepatitis A vaccine.** / Consult your health care provider.  Hepatitis B vaccine.** / Consult your health care provider.  Haemophilus influenzae type b (Hib) vaccine.** / Consult your health care provider. **Family history and personal history of risk and conditions may change your health care provider's recommendations.   This information is not intended to replace advice given to you by your health care provider. Make sure you discuss any questions you have with your health care provider.   Document Released: 01/09/2002 Document Revised: 12/04/2014 Document Reviewed: 04/10/2011  Chartered certified accountant Patient Education [employer].

## 2016-04-28 LAB — LDL CHOLESTEROL, DIRECT: LDL DIRECT: 102 mg/dL

## 2016-04-28 LAB — CBC
HEMATOCRIT: 45 % (ref 39.0–52.0)
HEMOGLOBIN: 15 g/dL (ref 13.0–17.0)
MCHC: 33.5 g/dL (ref 30.0–36.0)
MCV: 88.7 fl (ref 78.0–100.0)
Platelets: 224 10*3/uL (ref 150.0–400.0)
RBC: 5.07 Mil/uL (ref 4.22–5.81)
RDW: 13.8 % (ref 11.5–15.5)
WBC: 7.8 10*3/uL (ref 4.0–10.5)

## 2016-04-28 LAB — TSH: TSH: 2.63 u[IU]/mL (ref 0.35–4.50)

## 2016-04-28 LAB — COMPREHENSIVE METABOLIC PANEL
ALK PHOS: 69 U/L (ref 39–117)
ALT: 23 U/L (ref 0–53)
AST: 17 U/L (ref 0–37)
Albumin: 4.1 g/dL (ref 3.5–5.2)
BUN: 14 mg/dL (ref 6–23)
CO2: 26 mEq/L (ref 19–32)
Calcium: 8.9 mg/dL (ref 8.4–10.5)
Chloride: 103 mEq/L (ref 96–112)
Creatinine, Ser: 0.96 mg/dL (ref 0.40–1.50)
GFR: 90.19 mL/min (ref >60.00)
GLUCOSE: 103 mg/dL — AB (ref 70–99)
POTASSIUM: 4 meq/L (ref 3.5–5.1)
SODIUM: 138 meq/L (ref 135–145)
TOTAL PROTEIN: 6.6 g/dL (ref 6.0–8.3)
Total Bilirubin: 0.4 mg/dL (ref 0.2–1.2)

## 2016-04-28 LAB — LIPID PANEL
Cholesterol: 171 mg/dL (ref 0–200)
HDL: 35 mg/dL — AB (ref >39.00)
NONHDL: 136.31
Total CHOL/HDL Ratio: 5
Triglycerides: 266 mg/dL — ABNORMAL HIGH (ref 0.0–149.0)
VLDL: 53.2 mg/dL — ABNORMAL HIGH (ref 0.0–40.0)

## 2016-04-28 LAB — HEMOGLOBIN A1C: Hgb A1c MFr Bld: 7.1 % — ABNORMAL HIGH (ref 4.6–6.5)

## 2016-04-28 LAB — MICROALBUMIN / CREATININE URINE RATIO
Creatinine,U: 285.5 mg/dL
Microalb Creat Ratio: 0.3 mg/g (ref 0.0–30.0)
Microalb, Ur: 0.9 mg/dL (ref 0.0–1.9)

## 2016-05-01 ENCOUNTER — Other Ambulatory Visit: Payer: Self-pay | Admitting: Family Medicine

## 2016-05-07 ENCOUNTER — Encounter: Payer: Self-pay | Admitting: Family Medicine

## 2016-05-07 DIAGNOSIS — Z0000 Encounter for general adult medical examination without abnormal findings: Secondary | ICD-10-CM

## 2016-05-07 DIAGNOSIS — Z1211 Encounter for screening for malignant neoplasm of colon: Secondary | ICD-10-CM | POA: Insufficient documentation

## 2016-05-07 HISTORY — DX: Encounter for general adult medical examination without abnormal findings: Z00.00

## 2016-05-07 NOTE — Assessment & Plan Note (Signed)
Well controlled, no changes to meds. Encouraged heart healthy diet such as the DASH diet and exercise as tolerated.  °

## 2016-05-07 NOTE — Assessment & Plan Note (Signed)
Patient denies any difficulties at home. No trouble with ADLs, depression or falls. See EMR for functional status screen and depression screen. No recent changes to vision or hearing. Is UTD with immunizations. Is UTD with screening. Discussed Advanced Directives. Encouraged heart healthy diet, exercise as tolerated and adequate sleep. See patient's problem list for health risk factors to monitor. See AVS for preventative healthcare recommendation schedule. Given and reviewed copy of ACP documents from [place] Secretary of State and encouraged to complete and return. Labs reviewed 

## 2016-05-07 NOTE — Progress Notes (Signed)
Patient ID: Gary Gurney., male   DOB: 01/08/1971, 45 y.o.   MRN: [id number]   Subjective:    Patient ID: Gary Gurney., male    DOB: 09-29-1971, 45 y.o.   MRN: [id number]  Chief Complaint  Patient presents with  . Annual Exam    HPI Patient is in today for annual exam and follow up on numerous concerns. He is continuing to struggle with daily back pain and radiculopathy. He continues to try and stay active but his pain is limiting more. No incontinence, recent fall or injury. Denies CP/palp/SOB/HA/congestion/fevers/GI or GU c/o. Taking meds as prescribed  Past Medical History  Diagnosis Date  . Back pain 06/13/2010    Qualifier: Diagnosis of  By: Charlett Blake MD, Sonia Baller back at work in 2002, was able to work until 2004. Ultimately had  Surgeries to his lower back twice and after complications with a spinal leak, was never able to return to work Radicular symptoms occur b/l.   . Obesity, unspecified 06/13/2010    Qualifier: Diagnosis of  By: Charlett Blake MD, Erline Levine    . Knee pain, bilateral 08/26/2014  . Medicare annual wellness visit, subsequent 05/07/2016    Past Surgical History  Procedure Laterality Date  . Knee surgery  1999    cartilage repair  . Back surgery      X 2    Family History  Problem Relation Age of Onset  . Arthritis Other     family hx of  . Diabetes Other     family hx of  . Hypertension Other     family hx of  . Stroke Other     family hx of  . Sudden death Other     family hx of  . Stroke Father   . Diabetes Father   . Hyperlipidemia Father   . Hypertension Father   . Cancer Mother     breast  . Diabetes Mother   . Hyperlipidemia Mother   . Hypertension Mother   . Diabetes Sister   . Arthritis Sister   . Cancer Maternal Grandmother   . Diabetes Sister     Social History   Social History  . Marital Status: Married    Spouse Name: N/A  . Number of Children: N/A  . Years of Education: N/A   Occupational History  . Not on file.   Social  History Main Topics  . Smoking status: Current Every Day Smoker -- 1.00 packs/day    Types: Cigarettes  . Smokeless tobacco: Not on file  . Alcohol Use: No  . Drug Use: No  . Sexual Activity: Not on file   Other Topics Concern  . Not on file   Social History Narrative    Outpatient Prescriptions Prior to Visit  Medication Sig Dispense Refill  . carisoprodol (SOMA) 350 MG tablet Take 1 tablet (350 mg total) by mouth 3 (three) times daily as needed for muscle spasms. 90 tablet 5  . glucose blood (BAYER CONTOUR TEST) test strip Check blood glucose once daily and as needed. Dx: 250.00, HGBA1C level: 7.0% 100 each 5  . HYDROcodone-ibuprofen (VICOPROFEN) 7.5-200 MG tablet Take 1 tablet by mouth every 8 (eight) hours as needed. 60 tablet 0  . metFORMIN (GLUCOPHAGE) 500 MG tablet Take 1 tablet (500 mg total) by mouth 2 (two) times daily. 60 tablet 6  . MICROLET LANCETS MISC Check blood glucose once daily and as needed. Dx: 250.00, HGBA1C level: 7.0% 100 each 5  .  lisinopril (PRINIVIL,ZESTRIL) 5 MG tablet Take 1 tablet (5 mg total) by mouth daily. 30 tablet 5   No facility-administered medications prior to visit.    Allergies  Allergen Reactions  . Iodides     Review of Systems  Constitutional: Positive for malaise/fatigue. Negative for fever and chills.  HENT: Negative for congestion and hearing loss.   Eyes: Negative for discharge.  Respiratory: Negative for cough, sputum production and shortness of breath.   Cardiovascular: Negative for chest pain, palpitations and leg swelling.  Gastrointestinal: Negative for heartburn, nausea, vomiting, abdominal pain, diarrhea, constipation and blood in stool.  Genitourinary: Negative for dysuria, urgency, frequency and hematuria.  Musculoskeletal: Positive for back pain. Negative for myalgias and falls.  Skin: Negative for rash.  Neurological: Negative for dizziness, sensory change, loss of consciousness, weakness and headaches.    Endo/Heme/Allergies: Negative for environmental allergies. Does not bruise/bleed easily.  Psychiatric/Behavioral: Negative for depression and suicidal ideas. The patient is nervous/anxious. The patient does not have insomnia.        Objective:    Physical Exam  Constitutional: He is oriented to person, place, and time. He appears well-developed and well-nourished. No distress.  HENT:  Head: Normocephalic and atraumatic.  Eyes: Conjunctivae are normal.  Neck: Neck supple. No thyromegaly present.  Cardiovascular: Normal rate, regular rhythm and normal heart sounds.   No murmur heard. Pulmonary/Chest: Effort normal and breath sounds normal. No respiratory distress. He has no wheezes.  Abdominal: Soft. Bowel sounds are normal. He exhibits no mass. There is no tenderness.  Musculoskeletal: He exhibits no edema.  Lymphadenopathy:    He has no cervical adenopathy.  Neurological: He is alert and oriented to person, place, and time.  Skin: Skin is warm and dry.  Psychiatric: He has a normal mood and affect. His behavior is normal.    BP 117/82 mmHg  Pulse 65  Temp(Src) 97.7 F (36.5 C) (Oral)  Ht 6\' 2"  (1.88 m)  Wt 289 lb 4 oz (131.203 kg)  BMI 37.12 kg/m2  SpO2 97% Wt Readings from Last 3 Encounters:  04/27/16 289 lb 4 oz (131.203 kg)  10/26/15 300 lb 6 oz (136.249 kg)  12/24/14 299 lb 12.8 oz (135.988 kg)     Lab Results  Component Value Date   WBC 7.8 04/27/2016   HGB 15.0 04/27/2016   HCT 45.0 04/27/2016   PLT 224.0 04/27/2016   GLUCOSE 103* 04/27/2016   CHOL 171 04/27/2016   TRIG 266.0* 04/27/2016   HDL 35.00* 04/27/2016   LDLDIRECT 102.0 04/27/2016   LDLCALC 113* 12/24/2014   ALT 23 04/27/2016   AST 17 04/27/2016   NA 138 04/27/2016   K 4.0 04/27/2016   CL 103 04/27/2016   CREATININE 0.96 04/27/2016   BUN 14 04/27/2016   CO2 26 04/27/2016   TSH 2.63 04/27/2016   HGBA1C 7.1* 04/27/2016   MICROALBUR 0.9 04/27/2016    Lab Results  Component Value Date    TSH 2.63 04/27/2016   Lab Results  Component Value Date   WBC 7.8 04/27/2016   HGB 15.0 04/27/2016   HCT 45.0 04/27/2016   MCV 88.7 04/27/2016   PLT 224.0 04/27/2016   Lab Results  Component Value Date   NA 138 04/27/2016   K 4.0 04/27/2016   CO2 26 04/27/2016   GLUCOSE 103* 04/27/2016   BUN 14 04/27/2016   CREATININE 0.96 04/27/2016   BILITOT 0.4 04/27/2016   ALKPHOS 69 04/27/2016   AST 17 04/27/2016   ALT 23 04/27/2016  PROT 6.6 04/27/2016   ALBUMIN 4.1 04/27/2016   CALCIUM 8.9 04/27/2016   GFR 90.19 04/27/2016   Lab Results  Component Value Date   CHOL 171 04/27/2016   Lab Results  Component Value Date   HDL 35.00* 04/27/2016   Lab Results  Component Value Date   LDLCALC 113* 12/24/2014   Lab Results  Component Value Date   TRIG 266.0* 04/27/2016   Lab Results  Component Value Date   CHOLHDL 5 04/27/2016   Lab Results  Component Value Date   HGBA1C 7.1* 04/27/2016       Assessment & Plan:   Problem List Items Addressed This Visit    Obesity    Encouraged DASH diet, decrease po intake and increase exercise as tolerated. Needs 7-8 hours of sleep nightly. Avoid trans fats, eat small, frequent meals every 4-5 hours with lean proteins, complex carbs and healthy fats. Minimize simple carbs      Relevant Medications   metFORMIN (GLUCOPHAGE) 500 MG tablet   MIXED HYPERLIPIDEMIA    Encouraged heart healthy diet, increase exercise, avoid trans fats, consider a krill oil cap daily. Start Atorvastatin      Relevant Medications   glucose blood (BAYER CONTOUR TEST) test strip   metFORMIN (GLUCOPHAGE) 500 MG tablet   MICROLET LANCETS MISC   tiZANidine (ZANAFLEX) 4 MG tablet   HYDROcodone-ibuprofen (VICOPROFEN) 7.5-200 MG tablet   Other Relevant Orders   TSH (Completed)   CBC (Completed)   Hemoglobin A1c (Completed)   Lipid panel (Completed)   Comprehensive metabolic panel (Completed)   Microalbumin / creatinine urine ratio (Completed)   Medicare  annual wellness visit, subsequent    Patient denies any difficulties at home. No trouble with ADLs, depression or falls. See EMR for functional status screen and depression screen. No recent changes to vision or hearing. Is UTD with immunizations. Is UTD with screening. Discussed Advanced Directives. Encouraged heart healthy diet, exercise as tolerated and adequate sleep. See patient's problem list for health risk factors to monitor. See AVS for preventative healthcare recommendation schedule. Given and reviewed copy of ACP documents from [place] and encouraged to complete and return Labs reviewed      RESOLVED: Low back pain potentially associated with radiculopathy    Encouraged moist heat and gentle stretching as tolerated. May try NSAIDs and prescription meds as directed and report if symptoms worsen or seek immediate care. Try topical treatments and given rx for Tizanidine, will need referral if pain worsens.       Relevant Medications   tiZANidine (ZANAFLEX) 4 MG tablet   HYDROcodone-ibuprofen (VICOPROFEN) 7.5-200 MG tablet   ELEVATED BLOOD PRESSURE WITHOUT DIAGNOSIS OF HYPERTENSION    Well controlled, no changes to meds. Encouraged heart healthy diet such as the DASH diet and exercise as tolerated.       Relevant Medications   glucose blood (BAYER CONTOUR TEST) test strip   metFORMIN (GLUCOPHAGE) 500 MG tablet   MICROLET LANCETS MISC   tiZANidine (ZANAFLEX) 4 MG tablet   HYDROcodone-ibuprofen (VICOPROFEN) 7.5-200 MG tablet   Other Relevant Orders   TSH (Completed)   CBC (Completed)   Hemoglobin A1c (Completed)   Lipid panel (Completed)   Comprehensive metabolic panel (Completed)   Microalbumin / creatinine urine ratio (Completed)   Diabetes mellitus type 2 in obese (HCC)    hgba1c acceptable, minimize simple carbs. Increase exercise as tolerated. Continue current meds      Relevant Medications   glucose blood (BAYER CONTOUR TEST) test  strip   metFORMIN  (GLUCOPHAGE) 500 MG tablet   MICROLET LANCETS MISC   tiZANidine (ZANAFLEX) 4 MG tablet   HYDROcodone-ibuprofen (VICOPROFEN) 7.5-200 MG tablet   Other Relevant Orders   TSH (Completed)   CBC (Completed)   Hemoglobin A1c (Completed)   Lipid panel (Completed)   Comprehensive metabolic panel (Completed)   Microalbumin / creatinine urine ratio (Completed)   RESOLVED: Back pain - Primary   Relevant Medications   glucose blood (BAYER CONTOUR TEST) test strip   metFORMIN (GLUCOPHAGE) 500 MG tablet   MICROLET LANCETS MISC   tiZANidine (ZANAFLEX) 4 MG tablet   HYDROcodone-ibuprofen (VICOPROFEN) 7.5-200 MG tablet   Other Relevant Orders   TSH (Completed)   CBC (Completed)   Hemoglobin A1c (Completed)   Lipid panel (Completed)   Comprehensive metabolic panel (Completed)   Microalbumin / creatinine urine ratio (Completed)   TSH (Completed)   CBC (Completed)   Hemoglobin A1c (Completed)   Lipid panel (Completed)   Comprehensive metabolic panel (Completed)   Microalbumin / creatinine urine ratio (Completed)    Other Visit Diagnoses    Neck pain        Relevant Medications    glucose blood (BAYER CONTOUR TEST) test strip    metFORMIN (GLUCOPHAGE) 500 MG tablet    MICROLET LANCETS MISC    tiZANidine (ZANAFLEX) 4 MG tablet    HYDROcodone-ibuprofen (VICOPROFEN) 7.5-200 MG tablet    Other Relevant Orders    TSH (Completed)    CBC (Completed)    Hemoglobin A1c (Completed)    Lipid panel (Completed)    Comprehensive metabolic panel (Completed)    Microalbumin / creatinine urine ratio (Completed)    Need for vaccination with 13-polyvalent pneumococcal conjugate vaccine        Relevant Orders    Pneumococcal conjugate vaccine 13-valent (Completed)       I have discontinued Gary Short's carisoprodol and lisinopril. I have also changed his glucose blood and MICROLET LANCETS. Additionally, I am having him start on tiZANidine. Lastly, I am having him maintain his metFORMIN and  HYDROcodone-ibuprofen.  Meds ordered this encounter  Medications  . glucose blood (BAYER CONTOUR TEST) test strip    Sig: Check blood glucose once daily and as needed. Dx: E11.9    Dispense:  100 each    Refill:  5  . metFORMIN (GLUCOPHAGE) 500 MG tablet    Sig: Take 1 tablet (500 mg total) by mouth 2 (two) times daily.    Dispense:  60 tablet    Refill:  6  . MICROLET LANCETS MISC    Sig: Check blood sugar DX E11.9    Dispense:  100 each    Refill:  5  . tiZANidine (ZANAFLEX) 4 MG tablet    Sig: Take 1 tablet (4 mg total) by mouth every 6 (six) hours as needed for muscle spasms.    Dispense:  30 tablet    Refill:  0  . HYDROcodone-ibuprofen (VICOPROFEN) 7.5-200 MG tablet    Sig: Take 1 tablet by mouth every 8 (eight) hours as needed.    Dispense:  90 tablet    Refill:  0     Penni Homans, MD

## 2016-05-07 NOTE — Assessment & Plan Note (Signed)
Encouraged DASH diet, decrease po intake and increase exercise as tolerated. Needs 7-8 hours of sleep nightly. Avoid trans fats, eat small, frequent meals every 4-5 hours with lean proteins, complex carbs and healthy fats. Minimize simple carbs 

## 2016-05-07 NOTE — Assessment & Plan Note (Signed)
Encouraged moist heat and gentle stretching as tolerated. May try NSAIDs and prescription meds as directed and report if symptoms worsen or seek immediate care. Try topical treatments and given rx for Tizanidine, will need referral if pain worsens.

## 2016-05-07 NOTE — Assessment & Plan Note (Signed)
Encouraged heart healthy diet, increase exercise, avoid trans fats, consider a krill oil cap daily. Start Atorvastatin 

## 2016-05-07 NOTE — Assessment & Plan Note (Signed)
hgba1c acceptable, minimize simple carbs. Increase exercise as tolerated. Continue current meds 

## 2016-06-19 DIAGNOSIS — L02214 Cutaneous abscess of groin: Secondary | ICD-10-CM | POA: Diagnosis not present

## 2016-06-19 NOTE — Progress Notes (Signed)
erroeneous encounter

## 2016-09-27 ENCOUNTER — Other Ambulatory Visit: Payer: Self-pay

## 2016-11-02 ENCOUNTER — Ambulatory Visit (INDEPENDENT_AMBULATORY_CARE_PROVIDER_SITE_OTHER): Payer: Medicare Other | Admitting: Family Medicine

## 2016-11-02 VITALS — BP 128/82 | HR 71 | Temp 97.7°F | Wt 286.6 lb

## 2016-11-02 DIAGNOSIS — E1169 Type 2 diabetes mellitus with other specified complication: Secondary | ICD-10-CM

## 2016-11-02 DIAGNOSIS — M25562 Pain in left knee: Secondary | ICD-10-CM

## 2016-11-02 DIAGNOSIS — R030 Elevated blood-pressure reading, without diagnosis of hypertension: Secondary | ICD-10-CM

## 2016-11-02 DIAGNOSIS — E669 Obesity, unspecified: Secondary | ICD-10-CM | POA: Diagnosis not present

## 2016-11-02 DIAGNOSIS — M546 Pain in thoracic spine: Secondary | ICD-10-CM

## 2016-11-02 DIAGNOSIS — M25561 Pain in right knee: Secondary | ICD-10-CM

## 2016-11-02 DIAGNOSIS — M542 Cervicalgia: Secondary | ICD-10-CM

## 2016-11-02 DIAGNOSIS — Z23 Encounter for immunization: Secondary | ICD-10-CM | POA: Diagnosis not present

## 2016-11-02 DIAGNOSIS — G8929 Other chronic pain: Secondary | ICD-10-CM

## 2016-11-02 DIAGNOSIS — E782 Mixed hyperlipidemia: Secondary | ICD-10-CM | POA: Diagnosis not present

## 2016-11-02 DIAGNOSIS — M545 Low back pain: Secondary | ICD-10-CM | POA: Diagnosis not present

## 2016-11-02 DIAGNOSIS — E6609 Other obesity due to excess calories: Secondary | ICD-10-CM

## 2016-11-02 MED ORDER — AMOXICILLIN 500 MG PO CAPS: 500.0000 mg | ORAL_CAPSULE | Freq: Three times a day (TID) | ORAL | 0 refills | Status: DC

## 2016-11-02 MED ORDER — HYDROCODONE-IBUPROFEN 7.5-200 MG PO TABS: 1.0000 | ORAL_TABLET | Freq: Three times a day (TID) | ORAL | 0 refills | Status: DC | PRN

## 2016-11-02 MED ORDER — TIZANIDINE HCL 4 MG PO TABS: 4.0000 mg | ORAL_TABLET | Freq: Four times a day (QID) | ORAL | 2 refills | Status: DC | PRN

## 2016-11-02 NOTE — Progress Notes (Signed)
Pre visit review using our clinic review tool, if applicable. No additional management support is needed unless otherwise documented below in the visit note. 

## 2016-11-02 NOTE — Patient Instructions (Signed)
Lidocaine patches Cholesterol Cholesterol is a white, waxy, fat-like substance that is needed by the human body in small amounts. The liver makes all the cholesterol we need. Cholesterol is carried from the liver by the blood through the blood vessels. Deposits of cholesterol (plaques) may build up on blood vessel (artery) walls. Plaques make the arteries narrower and stiffer. Cholesterol plaques increase the risk for heart attack and stroke. You cannot feel your cholesterol level even if it is very high. The only way to know that it is high is to have a blood test. Once you know your cholesterol levels, you should keep a record of the test results. Work with your health care provider to keep your levels in the desired range. What do the results mean?  Total cholesterol is a rough measure of all the cholesterol in your blood.  LDL (low-density lipoprotein) is the "bad" cholesterol. This is the type that causes plaque to build up on the artery walls. You want this level to be low.  HDL (high-density lipoprotein) is the "good" cholesterol because it cleans the arteries and carries the LDL away. You want this level to be high.  Triglycerides are fat that the body can either burn for energy or store. High levels are closely linked to heart disease. What are the desired levels of cholesterol?  Total cholesterol below 200.  LDL below 100 for people who are at risk, below 70 for people at very high risk.  HDL above 40 is good. A level of 60 or higher is considered to be protective against heart disease.  Triglycerides below 150. How can I lower my cholesterol? Diet  Follow your diet program as told by your health care provider.  Choose fish or white meat chicken and [place], roasted or baked. Limit fatty cuts of red meat, fried foods, and processed meats, such as sausage and lunch meats.  Eat lots of fresh fruits and vegetables.  Choose whole grains, beans, pasta, potatoes, and  cereals.  Choose olive oil, corn oil, or canola oil, and use only small amounts.  Avoid butter, mayonnaise, shortening, or palm kernel oils.  Avoid foods with trans fats.  Drink skim or nonfat milk and eat low-fat or nonfat yogurt and cheeses. Avoid whole milk, cream, ice cream, egg yolks, and full-fat cheeses.  Healthier desserts include angel food cake, ginger snaps, animal crackers, hard candy, popsicles, and low-fat or nonfat frozen yogurt. Avoid pastries, cakes, pies, and cookies. Exercise  Follow your exercise program as told by your health care provider. A regular program:  Helps to decrease LDL and raise HDL.  Helps with weight control.  Do things that increase your activity level, such as gardening, walking, and taking the stairs.  Ask your health care provider about ways that you can be more active in your daily life. Medicine  Take over-the-counter and prescription medicines only as told by your health care provider.  Medicine may be prescribed by your health care provider to help lower cholesterol and decrease the risk for heart disease. This is usually done if diet and exercise have failed to bring down cholesterol levels.  If you have several risk factors, you may need medicine even if your levels are normal. This information is not intended to replace advice given to you by your health care provider. Make sure you discuss any questions you have with your health care provider. Document Released: 08/08/2001 Document Revised: 06/10/2016 Document Reviewed: 05/13/2016 Elsevier Interactive Patient Education  2017 [employer].

## 2016-11-02 NOTE — Assessment & Plan Note (Signed)
hgba1c acceptable, minimize simple carbs. Increase exercise as tolerated. Continue current meds, check levels today

## 2016-11-02 NOTE — Assessment & Plan Note (Signed)
Encouraged heart healthy diet, increase exercise, avoid trans fats, consider a krill oil cap daily. Did not tolerate Atorvastatin caused his blood sugar to spike above 300, check level today

## 2016-11-03 ENCOUNTER — Other Ambulatory Visit: Payer: Self-pay | Admitting: Family Medicine

## 2016-11-03 DIAGNOSIS — M542 Cervicalgia: Secondary | ICD-10-CM

## 2016-11-03 DIAGNOSIS — E1169 Type 2 diabetes mellitus with other specified complication: Secondary | ICD-10-CM

## 2016-11-03 DIAGNOSIS — R030 Elevated blood-pressure reading, without diagnosis of hypertension: Secondary | ICD-10-CM

## 2016-11-03 DIAGNOSIS — M546 Pain in thoracic spine: Secondary | ICD-10-CM

## 2016-11-03 DIAGNOSIS — E669 Obesity, unspecified: Secondary | ICD-10-CM

## 2016-11-03 DIAGNOSIS — E782 Mixed hyperlipidemia: Secondary | ICD-10-CM

## 2016-11-03 LAB — COMPREHENSIVE METABOLIC PANEL
ALBUMIN: 4.1 g/dL (ref 3.5–5.2)
ALT: 29 U/L (ref 0–53)
AST: 20 U/L (ref 0–37)
Alkaline Phosphatase: 80 U/L (ref 39–117)
BUN: 14 mg/dL (ref 6–23)
CHLORIDE: 101 meq/L (ref 96–112)
CO2: 31 mEq/L (ref 19–32)
CREATININE: 1.02 mg/dL (ref 0.40–1.50)
Calcium: 8.9 mg/dL (ref 8.4–10.5)
GFR: 83.9 mL/min (ref >60.00)
Glucose, Bld: 203 mg/dL — ABNORMAL HIGH (ref 70–99)
Potassium: 4.4 mEq/L (ref 3.5–5.1)
SODIUM: 136 meq/L (ref 135–145)
TOTAL PROTEIN: 6.7 g/dL (ref 6.0–8.3)
Total Bilirubin: 0.4 mg/dL (ref 0.2–1.2)

## 2016-11-03 LAB — CBC
HCT: 46.9 % (ref 39.0–52.0)
Hemoglobin: 15.6 g/dL (ref 13.0–17.0)
MCHC: 33.3 g/dL (ref 30.0–36.0)
MCV: 89.9 fl (ref 78.0–100.0)
Platelets: 199 10*3/uL (ref 150.0–400.0)
RBC: 5.22 Mil/uL (ref 4.22–5.81)
RDW: 13.7 % (ref 11.5–15.5)
WBC: 8.4 10*3/uL (ref 4.0–10.5)

## 2016-11-03 LAB — LIPID PANEL
Cholesterol: 168 mg/dL (ref 0–200)
HDL: 41 mg/dL (ref >39.00)
NonHDL: 127.15
Total CHOL/HDL Ratio: 4
Triglycerides: 213 mg/dL — ABNORMAL HIGH (ref 0.0–149.0)
VLDL: 42.6 mg/dL — AB (ref 0.0–40.0)

## 2016-11-03 LAB — TSH: TSH: 1.29 u[IU]/mL (ref 0.35–4.50)

## 2016-11-03 LAB — HEMOGLOBIN A1C: HEMOGLOBIN A1C: 10 % — AB (ref 4.6–6.5)

## 2016-11-03 LAB — LDL CHOLESTEROL, DIRECT: LDL DIRECT: 108 mg/dL

## 2016-11-03 MED ORDER — METFORMIN HCL 500 MG PO TABS: ORAL_TABLET | ORAL | 5 refills | Status: DC

## 2016-11-26 NOTE — Assessment & Plan Note (Signed)
Encouraged DASH diet, decrease po intake and increase exercise as tolerated. Needs 7-8 hours of sleep nightly. Avoid trans fats, eat small, frequent meals every 4-5 hours with lean proteins, complex carbs and healthy fats. Minimize simple carbs 

## 2016-11-26 NOTE — Progress Notes (Signed)
Patient ID: Gary Short., male   DOB: [birth year], 45 y.o.   MRN: [id number]   Subjective:    Patient ID: Gary Short., male    DOB: [birth year], 45 y.o.   MRN: [id number]  Chief Complaint  Patient presents with  . Follow-up    62-month up.    HPI Patient is in today for follow up and accompanied by his wife and children. He continues to struggle with chronic pain but continues to stay active and takes very little pain meds. No recent acute illness or hospitalizations. Denies polyuria or polydipsia. Denies CP/palp/SOB/HA/congestion/fevers/GI or GU c/o. Taking meds as prescribed  Past Medical History:  Diagnosis Date  . Back pain 06/13/2010   Qualifier: Diagnosis of  By: Charlett Blake MD, Sonia Baller back at work in 2002, was able to work until 2004. Ultimately had  Surgeries to his lower back twice and after complications with a spinal leak, was never able to return to work Radicular symptoms occur b/l.   Marland Kitchen Knee pain, bilateral 08/26/2014  . Medicare annual wellness visit, subsequent 05/07/2016  . Obesity, unspecified 06/13/2010   Qualifier: Diagnosis of  By: Charlett Blake MD, Erline Levine      Past Surgical History:  Procedure Laterality Date  . BACK SURGERY     X 2  . KNEE SURGERY  1999   cartilage repair    Family History  Problem Relation Age of Onset  . Arthritis Other     family hx of  . Diabetes Other     family hx of  . Hypertension Other     family hx of  . Stroke Other     family hx of  . Sudden death Other     family hx of  . Stroke Father   . Diabetes Father   . Hyperlipidemia Father   . Hypertension Father   . Cancer Mother     breast  . Diabetes Mother   . Hyperlipidemia Mother   . Hypertension Mother   . Diabetes Sister   . Arthritis Sister   . Cancer Maternal Grandmother   . Diabetes Sister     Social History   Social History  . Marital status: Married    Spouse name: N/A  . Number of children: N/A  . Years of education: N/A   Occupational History  .  Not on file.   Social History Main Topics  . Smoking status: Current Every Day Smoker    Packs/day: 1.00    Types: Cigarettes  . Smokeless tobacco: Not on file  . Alcohol use No  . Drug use: No  . Sexual activity: Not on file   Other Topics Concern  . Not on file   Social History Narrative  . No narrative on file    Outpatient Medications Prior to Visit  Medication Sig Dispense Refill  . glucose blood (BAYER CONTOUR TEST) test strip Check blood glucose once daily and as needed. Dx: E11.9 100 each 5  . MICROLET LANCETS MISC Check blood sugar DX E11.9 100 each 5  . atorvastatin (LIPITOR) 10 MG tablet Take 10 mg by mouth daily.    Marland Kitchen HYDROcodone-ibuprofen (VICOPROFEN) 7.5-200 MG tablet Take 1 tablet by mouth every 8 (eight) hours as needed. 90 tablet 0  . metFORMIN (GLUCOPHAGE) 500 MG tablet Take 1 tablet (500 mg total) by mouth 2 (two) times daily. (Patient taking differently: Take 2 tablets by mouth twice daily.) 60 tablet 6  . tiZANidine (ZANAFLEX) 4 MG  tablet Take 1 tablet (4 mg total) by mouth every 6 (six) hours as needed for muscle spasms. (Patient not taking: Reported on 11/02/2016) 30 tablet 0   No facility-administered medications prior to visit.     Allergies  Allergen Reactions  . Iodides     Review of Systems  Constitutional: Negative for fever and malaise/fatigue.  HENT: Negative for congestion.   Eyes: Negative for blurred vision.  Respiratory: Negative for shortness of breath.   Cardiovascular: Negative for chest pain, palpitations and leg swelling.  Gastrointestinal: Negative for abdominal pain, blood in stool and nausea.  Genitourinary: Negative for dysuria and frequency.  Musculoskeletal: Positive for back pain and joint pain. Negative for falls.  Skin: Negative for rash.  Neurological: Negative for dizziness and loss of consciousness.  Endo/Heme/Allergies: Negative for environmental allergies.  Psychiatric/Behavioral: Negative for depression. The patient  is not nervous/anxious.        Objective:    Physical Exam  Constitutional: He is oriented to person, place, and time. He appears well-developed and well-nourished. No distress.  HENT:  Head: Normocephalic and atraumatic.  Nose: Nose normal.  Eyes: Right eye exhibits no discharge. Left eye exhibits no discharge.  Neck: Normal range of motion. Neck supple.  Cardiovascular: Normal rate and regular rhythm.   No murmur heard. Pulmonary/Chest: Effort normal and breath sounds normal.  Abdominal: Soft. Bowel sounds are normal. There is no tenderness.  Musculoskeletal: He exhibits no edema.  Neurological: He is alert and oriented to person, place, and time.  Skin: Skin is warm and dry.  Psychiatric: He has a normal mood and affect.  Nursing note and vitals reviewed.   BP 128/82 (BP Location: Left Arm, Patient Position: Sitting, Cuff Size: Large)   Pulse 71   Temp 97.7 F (36.5 C) (Oral)   Wt 286 lb 9.6 oz (130 kg)   SpO2 96% Comment: RA  BMI 36.80 kg/m  Wt Readings from Last 3 Encounters:  11/02/16 286 lb 9.6 oz (130 kg)  04/27/16 289 lb 4 oz (131.2 kg)  10/26/15 (!) 300 lb 6 oz (136.2 kg)     Lab Results  Component Value Date   WBC 8.4 11/02/2016   HGB 15.6 11/02/2016   HCT 46.9 11/02/2016   PLT 199.0 11/02/2016   GLUCOSE 203 (H) 11/02/2016   CHOL 168 11/02/2016   TRIG 213.0 (H) 11/02/2016   HDL 41.00 11/02/2016   LDLDIRECT 108.0 11/02/2016   LDLCALC 113 (H) 12/24/2014   ALT 29 11/02/2016   AST 20 11/02/2016   NA 136 11/02/2016   K 4.4 11/02/2016   CL 101 11/02/2016   CREATININE 1.02 11/02/2016   BUN 14 11/02/2016   CO2 31 11/02/2016   TSH 1.29 11/02/2016   HGBA1C 10.0 (H) 11/02/2016   MICROALBUR 0.9 04/27/2016    Lab Results  Component Value Date   TSH 1.29 11/02/2016   Lab Results  Component Value Date   WBC 8.4 11/02/2016   HGB 15.6 11/02/2016   HCT 46.9 11/02/2016   MCV 89.9 11/02/2016   PLT 199.0 11/02/2016   Lab Results  Component Value Date    NA 136 11/02/2016   K 4.4 11/02/2016   CO2 31 11/02/2016   GLUCOSE 203 (H) 11/02/2016   BUN 14 11/02/2016   CREATININE 1.02 11/02/2016   BILITOT 0.4 11/02/2016   ALKPHOS 80 11/02/2016   AST 20 11/02/2016   ALT 29 11/02/2016   PROT 6.7 11/02/2016   ALBUMIN 4.1 11/02/2016   CALCIUM 8.9 11/02/2016  GFR 83.90 11/02/2016   Lab Results  Component Value Date   CHOL 168 11/02/2016   Lab Results  Component Value Date   HDL 41.00 11/02/2016   Lab Results  Component Value Date   LDLCALC 113 (H) 12/24/2014   Lab Results  Component Value Date   TRIG 213.0 (H) 11/02/2016   Lab Results  Component Value Date   CHOLHDL 4 11/02/2016   Lab Results  Component Value Date   HGBA1C 10.0 (H) 11/02/2016       Assessment & Plan:   Problem List Items Addressed This Visit    MIXED HYPERLIPIDEMIA    Encouraged heart healthy diet, increase exercise, avoid trans fats, consider a krill oil cap daily. Did not tolerate Atorvastatin caused his blood sugar to spike above 300, check level today      Relevant Medications   tiZANidine (ZANAFLEX) 4 MG tablet   HYDROcodone-ibuprofen (VICOPROFEN) 7.5-200 MG tablet   Other Relevant Orders   Comprehensive metabolic panel (Completed)   Lipid panel (Completed)   Obesity    Encouraged DASH diet, decrease po intake and increase exercise as tolerated. Needs 7-8 hours of sleep nightly. Avoid trans fats, eat small, frequent meals every 4-5 hours with lean proteins, complex carbs and healthy fats. Minimize simple carbs      ELEVATED BLOOD PRESSURE WITHOUT DIAGNOSIS OF HYPERTENSION    Well controlled. Encouraged heart healthy diet such as the DASH diet and exercise as tolerated.       Relevant Medications   tiZANidine (ZANAFLEX) 4 MG tablet   HYDROcodone-ibuprofen (VICOPROFEN) 7.5-200 MG tablet   Diabetes mellitus type 2 in obese (HCC)    hgba1c acceptable, minimize simple carbs. Increase exercise as tolerated. Continue current meds, check levels  today      Relevant Medications   tiZANidine (ZANAFLEX) 4 MG tablet   HYDROcodone-ibuprofen (VICOPROFEN) 7.5-200 MG tablet   Other Relevant Orders   Comprehensive metabolic panel (Completed)   Hemoglobin A1c (Completed)   TSH (Completed)   Knee pain, bilateral    Encouraged moist heat and gentle stretching as tolerated. May try NSAIDs and prescription meds as directed and report if symptoms worsen or seek immediate care. Pain meds to be used sparingly       Other Visit Diagnoses    Chronic bilateral low back pain, with sciatica presence unspecified    -  Primary   Relevant Medications   tiZANidine (ZANAFLEX) 4 MG tablet   HYDROcodone-ibuprofen (VICOPROFEN) 7.5-200 MG tablet   Other Relevant Orders   CBC (Completed)   Comprehensive metabolic panel (Completed)   TSH (Completed)   Acute bilateral thoracic back pain       Relevant Medications   tiZANidine (ZANAFLEX) 4 MG tablet   HYDROcodone-ibuprofen (VICOPROFEN) 7.5-200 MG tablet   Neck pain       Relevant Medications   tiZANidine (ZANAFLEX) 4 MG tablet   HYDROcodone-ibuprofen (VICOPROFEN) 7.5-200 MG tablet   Chronic bilateral thoracic back pain       Relevant Medications   tiZANidine (ZANAFLEX) 4 MG tablet   HYDROcodone-ibuprofen (VICOPROFEN) 7.5-200 MG tablet   Encounter for immunization       Relevant Orders   Flu Vaccine QUAD 36+ mos IM (Completed)      I have discontinued Mr. Coppinger's atorvastatin and CARISOPRODOL PO. I am also having him start on amoxicillin. Additionally, I am having him maintain his glucose blood, MICROLET LANCETS, tiZANidine, and HYDROcodone-ibuprofen.  Meds ordered this encounter  Medications  . DISCONTD: CARISOPRODOL PO  Sig: Take 1 tablet by mouth 4 (four) times daily as needed.  Marland Kitchen tiZANidine (ZANAFLEX) 4 MG tablet    Sig: Take 1 tablet (4 mg total) by mouth every 6 (six) hours as needed for muscle spasms.    Dispense:  90 tablet    Refill:  2  . amoxicillin (AMOXIL) 500 MG capsule     Sig: Take 1 capsule (500 mg total) by mouth 3 (three) times daily.    Dispense:  30 capsule    Refill:  0  . HYDROcodone-ibuprofen (VICOPROFEN) 7.5-200 MG tablet    Sig: Take 1 tablet by mouth every 8 (eight) hours as needed.    Dispense:  90 tablet    Refill:  0    Penni Homans, MD

## 2016-11-26 NOTE — Assessment & Plan Note (Signed)
Well controlled. Encouraged heart healthy diet such as the DASH diet and exercise as tolerated.  

## 2016-11-26 NOTE — Assessment & Plan Note (Signed)
Encouraged moist heat and gentle stretching as tolerated. May try NSAIDs and prescription meds as directed and report if symptoms worsen or seek immediate care. Pain meds to be used sparingly

## 2017-02-13 ENCOUNTER — Telehealth: Payer: Self-pay | Admitting: Family Medicine

## 2017-02-13 ENCOUNTER — Other Ambulatory Visit: Payer: Self-pay | Admitting: Family Medicine

## 2017-02-13 DIAGNOSIS — L989 Disorder of the skin and subcutaneous tissue, unspecified: Secondary | ICD-10-CM

## 2017-02-13 NOTE — Telephone Encounter (Signed)
Spoke to patients wife.  Spot is now black and has been there for a while since he saw Dr. Charlett Blake It is not painful just bothers him Referral needs to be in [facility] Dr. Reino Kent in [place]

## 2017-02-13 NOTE — Telephone Encounter (Signed)
Caller name: Crepeau,Rachel Relation to DJ:SHFWYO  Call back number: [phone]    Reason for call:  Requesting a referral to a specialist for spot on patient lip,please advise

## 2017-02-20 ENCOUNTER — Encounter: Payer: Self-pay | Admitting: Family Medicine

## 2017-05-07 ENCOUNTER — Encounter: Payer: Medicare Other | Admitting: Family Medicine

## 2017-05-08 ENCOUNTER — Encounter: Payer: Self-pay | Admitting: Family Medicine

## 2017-05-08 ENCOUNTER — Ambulatory Visit (INDEPENDENT_AMBULATORY_CARE_PROVIDER_SITE_OTHER): Payer: Medicare Other | Admitting: Family Medicine

## 2017-05-08 DIAGNOSIS — R030 Elevated blood-pressure reading, without diagnosis of hypertension: Secondary | ICD-10-CM

## 2017-05-08 DIAGNOSIS — G8929 Other chronic pain: Secondary | ICD-10-CM

## 2017-05-08 DIAGNOSIS — E1169 Type 2 diabetes mellitus with other specified complication: Secondary | ICD-10-CM | POA: Diagnosis not present

## 2017-05-08 DIAGNOSIS — R0789 Other chest pain: Secondary | ICD-10-CM

## 2017-05-08 DIAGNOSIS — M546 Pain in thoracic spine: Secondary | ICD-10-CM | POA: Diagnosis not present

## 2017-05-08 DIAGNOSIS — E6609 Other obesity due to excess calories: Secondary | ICD-10-CM

## 2017-05-08 DIAGNOSIS — E782 Mixed hyperlipidemia: Secondary | ICD-10-CM | POA: Diagnosis not present

## 2017-05-08 DIAGNOSIS — E669 Obesity, unspecified: Secondary | ICD-10-CM | POA: Diagnosis not present

## 2017-05-08 DIAGNOSIS — M542 Cervicalgia: Secondary | ICD-10-CM

## 2017-05-08 DIAGNOSIS — F418 Other specified anxiety disorders: Secondary | ICD-10-CM

## 2017-05-08 HISTORY — DX: Other chest pain: R07.89

## 2017-05-08 MED ORDER — HYDROCODONE-IBUPROFEN 7.5-200 MG PO TABS: 1.0000 | ORAL_TABLET | Freq: Three times a day (TID) | ORAL | 0 refills | Status: DC | PRN

## 2017-05-08 MED ORDER — TIZANIDINE HCL 4 MG PO TABS: 4.0000 mg | ORAL_TABLET | Freq: Four times a day (QID) | ORAL | 5 refills | Status: DC | PRN

## 2017-05-08 MED ORDER — ASPIRIN EC 81 MG PO TBEC: 81.0000 mg | DELAYED_RELEASE_TABLET | Freq: Every day | ORAL | Status: AC

## 2017-05-08 NOTE — Assessment & Plan Note (Addendum)
Referred to cardiology in [facility], no symptoms today. Likely musculoskeletal but with risk factors will refer to cardiology for further work up.

## 2017-05-08 NOTE — Assessment & Plan Note (Signed)
hgba1c acceptable, minimize simple carbs. Increase exercise as tolerated.  

## 2017-05-08 NOTE — Progress Notes (Signed)
Subjective:  I acted as a Education administrator for Dr. Charlett Blake. [place], [place]  Patient ID: Gary Gurney., male    DOB: 1971/04/02, 46 y.o.   MRN: [id number]  No chief complaint on file.   HPI  Patient is in today for  A follow up visit, patient c/o both arms tingling at Times. It typically is present throughout the day but does worsen at the day goes on. He reports daily neck pain with radicular tingling down both arms. Also has some trouble with low back pain and radiculopathy into his legs at times as well. No recent falls or trauma. No incontinence. He does struggle with a grade to stress and has been struggling with some depression and anxiety. Endorses anhedonia but not suicidal ideation. No recent febrile illness. Denies polyuria or polydipsia. Continues to take medications very sparingly for pain but does note some relief with Zanaflex and hydrocodone but he does take it. Denies CP/palp/SOB/HA/congestion/fevers/GI or GU c/o. Taking meds as prescribed Patient Care Team: [name], MD as PCP - General   Past Medical History:  Diagnosis Date  . Atypical chest pain 05/08/2017  . Back pain 06/13/2010   Qualifier: Diagnosis of  By: Charlett Blake MD, Sonia Baller back at work in 2002, was able to work until 2004. Ultimately had  Surgeries to his lower back twice and after complications with a spinal leak, was never able to return to work Radicular symptoms occur b/l.   Marland Kitchen Knee pain, bilateral 08/26/2014  . Medicare annual wellness visit, subsequent 05/07/2016  . Obesity, unspecified 06/13/2010   Qualifier: Diagnosis of  By: Charlett Blake MD, Erline Levine      Past Surgical History:  Procedure Laterality Date  . BACK SURGERY     X 2  . KNEE SURGERY  1999   cartilage repair    Family History  Problem Relation Age of Onset  . Arthritis Other        family hx of  . Diabetes Other        family hx of  . Hypertension Other        family hx of  . Stroke Other        family hx of  . Sudden death Other    family hx of  . Stroke Father   . Diabetes Father   . Hyperlipidemia Father   . Hypertension Father   . Cancer Mother        breast  . Diabetes Mother   . Hyperlipidemia Mother   . Hypertension Mother   . Diabetes Sister   . Arthritis Sister   . Cancer Maternal Grandmother   . Diabetes Sister     Social History   Social History  . Marital status: Married    Spouse name: N/A  . Number of children: N/A  . Years of education: N/A   Occupational History  . Not on file.   Social History Main Topics  . Smoking status: Current Every Day Smoker    Packs/day: 1.00    Types: Cigarettes  . Smokeless tobacco: Not on file  . Alcohol use No  . Drug use: No  . Sexual activity: Not on file   Other Topics Concern  . Not on file   Social History Narrative  . No narrative on file    Outpatient Medications Prior to Visit  Medication Sig Dispense Refill  . glucose blood (BAYER CONTOUR TEST) test strip Check blood glucose once daily and as needed. Dx:  E11.9 100 each 5  . metFORMIN (GLUCOPHAGE) 500 MG tablet Take 2 tablets by mouth twice daily. 120 tablet 5  . MICROLET LANCETS MISC Check blood sugar DX E11.9 100 each 5  . amoxicillin (AMOXIL) 500 MG capsule Take 1 capsule (500 mg total) by mouth 3 (three) times daily. 30 capsule 0  . HYDROcodone-ibuprofen (VICOPROFEN) 7.5-200 MG tablet Take 1 tablet by mouth every 8 (eight) hours as needed. 90 tablet 0  . tiZANidine (ZANAFLEX) 4 MG tablet Take 1 tablet (4 mg total) by mouth every 6 (six) hours as needed for muscle spasms. 90 tablet 2   No facility-administered medications prior to visit.     Allergies  Allergen Reactions  . Iodides     Review of Systems  Constitutional: Positive for malaise/fatigue. Negative for fever.  HENT: Negative for congestion.   Eyes: Negative for blurred vision.  Respiratory: Negative for shortness of breath.   Cardiovascular: Negative for chest pain, palpitations and leg swelling.    Gastrointestinal: Positive for constipation. Negative for abdominal pain, blood in stool and nausea.  Genitourinary: Negative for dysuria and frequency.  Musculoskeletal: Positive for back pain and joint pain. Negative for falls.  Skin: Negative for rash.  Neurological: Positive for sensory change. Negative for dizziness, loss of consciousness and headaches.  Endo/Heme/Allergies: Negative for environmental allergies.  Psychiatric/Behavioral: Positive for depression. Negative for substance abuse and suicidal ideas. The patient is nervous/anxious.        Objective:    Physical Exam  Constitutional: He is oriented to person, place, and time. He appears well-developed and well-nourished. No distress.  HENT:  Head: Normocephalic and atraumatic.  Nose: Nose normal.  Eyes: Right eye exhibits no discharge. Left eye exhibits no discharge.  Neck: Normal range of motion. Neck supple.  Cardiovascular: Normal rate and regular rhythm.   No murmur heard. Pulmonary/Chest: Effort normal and breath sounds normal.  Abdominal: Soft. Bowel sounds are normal. There is no tenderness.  Musculoskeletal: He exhibits no edema.  Neurological: He is alert and oriented to person, place, and time.  Skin: Skin is warm and dry.  Psychiatric: He has a normal mood and affect.  Nursing note and vitals reviewed.   BP 118/86 (BP Location: Left Arm, Patient Position: Sitting, Cuff Size: Large)   Pulse 76  Wt Readings from Last 3 Encounters:  11/02/16 286 lb 9.6 oz (130 kg)  04/27/16 289 lb 4 oz (131.2 kg)  10/26/15 (!) 300 lb 6 oz (136.2 kg)   BP Readings from Last 3 Encounters:  05/08/17 118/86  11/02/16 128/82  04/27/16 117/82     Immunization History  Administered Date(s) Administered  . Influenza,inj,Quad PF,36+ Mos 12/24/2014, 10/26/2015, 11/02/2016  . Pneumococcal Conjugate-13 04/27/2016  . Tdap 12/24/2014    Health Maintenance  Topic Date Due  . PNEUMOCOCCAL POLYSACCHARIDE VACCINE (1)  09/18/1973  . FOOT EXAM  09/18/1981  . OPHTHALMOLOGY EXAM  09/18/1981  . HIV Screening  09/18/1986  . URINE MICROALBUMIN  04/27/2017  . HEMOGLOBIN A1C  05/03/2017  . INFLUENZA VACCINE  06/27/2017  . TETANUS/TDAP  12/24/2024    Lab Results  Component Value Date   WBC 8.4 11/02/2016   HGB 15.6 11/02/2016   HCT 46.9 11/02/2016   PLT 199.0 11/02/2016   GLUCOSE 203 (H) 11/02/2016   CHOL 168 11/02/2016   TRIG 213.0 (H) 11/02/2016   HDL 41.00 11/02/2016   LDLDIRECT 108.0 11/02/2016   LDLCALC 113 (H) 12/24/2014   ALT 29 11/02/2016   AST 20 11/02/2016  NA 136 11/02/2016   K 4.4 11/02/2016   CL 101 11/02/2016   CREATININE 1.02 11/02/2016   BUN 14 11/02/2016   CO2 31 11/02/2016   TSH 1.29 11/02/2016   HGBA1C 10.0 (H) 11/02/2016   MICROALBUR 0.9 04/27/2016    Lab Results  Component Value Date   TSH 1.29 11/02/2016   Lab Results  Component Value Date   WBC 8.4 11/02/2016   HGB 15.6 11/02/2016   HCT 46.9 11/02/2016   MCV 89.9 11/02/2016   PLT 199.0 11/02/2016   Lab Results  Component Value Date   NA 136 11/02/2016   K 4.4 11/02/2016   CO2 31 11/02/2016   GLUCOSE 203 (H) 11/02/2016   BUN 14 11/02/2016   CREATININE 1.02 11/02/2016   BILITOT 0.4 11/02/2016   ALKPHOS 80 11/02/2016   AST 20 11/02/2016   ALT 29 11/02/2016   PROT 6.7 11/02/2016   ALBUMIN 4.1 11/02/2016   CALCIUM 8.9 11/02/2016   GFR 83.90 11/02/2016   Lab Results  Component Value Date   CHOL 168 11/02/2016   Lab Results  Component Value Date   HDL 41.00 11/02/2016   Lab Results  Component Value Date   LDLCALC 113 (H) 12/24/2014   Lab Results  Component Value Date   TRIG 213.0 (H) 11/02/2016   Lab Results  Component Value Date   CHOLHDL 4 11/02/2016   Lab Results  Component Value Date   HGBA1C 10.0 (H) 11/02/2016         Assessment & Plan:   Problem List Items Addressed This Visit    MIXED HYPERLIPIDEMIA    Encouraged heart healthy diet, increase exercise, avoid trans fats,  consider a krill oil cap daily      Relevant Medications   tiZANidine (ZANAFLEX) 4 MG tablet   aspirin EC 81 MG tablet   Other Relevant Orders   Lipid panel   Obesity    Encouraged DASH diet, decrease po intake and increase exercise as tolerated. Needs 7-8 hours of sleep nightly. Avoid trans fats, eat small, frequent meals every 4-5 hours with lean proteins, complex carbs and healthy fats. Minimize simple carbs      Depression with anxiety    Is struggling with increased stress but declines any medications at this time he will report if worsens      Back pain    Given refills on Zanaflex and hydrocodone to use sparingly. Offered imaging vs referral to ortho or neurosurgery but declines for now. Will let us know if wants to proceed      Relevant Medications   tiZANidine (ZANAFLEX) 4 MG tablet   aspirin EC 81 MG tablet   HYDROcodone-ibuprofen (VICOPROFEN) 7.5-200 MG tablet   ELEVATED BLOOD PRESSURE WITHOUT DIAGNOSIS OF HYPERTENSION   Relevant Medications   tiZANidine (ZANAFLEX) 4 MG tablet   Other Relevant Orders   CBC   TSH   Diabetes mellitus type 2 in obese (HCC)    hgba1c acceptable, minimize simple carbs. Increase exercise as tolerated.       Relevant Medications   tiZANidine (ZANAFLEX) 4 MG tablet   aspirin EC 81 MG tablet   Other Relevant Orders   Hemoglobin A1c   Comprehensive metabolic panel   TSH   Atypical chest pain    Referred to cardiology in [facility], no symptoms today. Likely musculoskeletal but with risk factors will refer to cardiology for further work up.       Relevant Orders   Ambulatory referral to Cardiology  Other Visit Diagnoses    Neck pain       Relevant Medications   tiZANidine (ZANAFLEX) 4 MG tablet   HYDROcodone-ibuprofen (VICOPROFEN) 7.5-200 MG tablet      I have discontinued Gary Short's amoxicillin. I am also having him start on aspirin EC. Additionally, I am having him maintain his glucose blood, MICROLET LANCETS, metFORMIN,  tiZANidine, and HYDROcodone-ibuprofen.  Meds ordered this encounter  Medications  . DISCONTD: HYDROcodone-ibuprofen (VICOPROFEN) 7.5-200 MG tablet    Sig: Take 1 tablet by mouth every 8 (eight) hours as needed.    Dispense:  90 tablet    Refill:  0  . tiZANidine (ZANAFLEX) 4 MG tablet    Sig: Take 1 tablet (4 mg total) by mouth every 6 (six) hours as needed for muscle spasms.    Dispense:  90 tablet    Refill:  5  . aspirin EC 81 MG tablet    Sig: Take 1 tablet (81 mg total) by mouth daily.  Marland Kitchen HYDROcodone-ibuprofen (VICOPROFEN) 7.5-200 MG tablet    Sig: Take 1 tablet by mouth every 8 (eight) hours as needed.    Dispense:  90 tablet    Refill:  0    CMA served as scribe during this visit. History, Physical and Plan performed by medical provider. Documentation and orders reviewed and attested to.  Penni Homans, MD

## 2017-05-08 NOTE — Assessment & Plan Note (Signed)
Encouraged DASH diet, decrease po intake and increase exercise as tolerated. Needs 7-8 hours of sleep nightly. Avoid trans fats, eat small, frequent meals every 4-5 hours with lean proteins, complex carbs and healthy fats. Minimize simple carbs 

## 2017-05-08 NOTE — Assessment & Plan Note (Signed)
Encouraged heart healthy diet, increase exercise, avoid trans fats, consider a krill oil cap daily 

## 2017-05-08 NOTE — Assessment & Plan Note (Signed)
Is struggling with increased stress but declines any medications at this time he will report if worsens

## 2017-05-08 NOTE — Patient Instructions (Signed)

## 2017-05-08 NOTE — Assessment & Plan Note (Signed)
Given refills on Zanaflex and hydrocodone to use sparingly. Offered imaging vs referral to ortho or neurosurgery but declines for now. Will let us know if wants to proceed

## 2017-05-09 LAB — LIPID PANEL
Cholesterol: 188 mg/dL (ref 0–200)
HDL: 46.3 mg/dL (ref >39.00)
LDL CALC: 106 mg/dL — AB (ref 0–99)
NonHDL: 141.93
Total CHOL/HDL Ratio: 4
Triglycerides: 181 mg/dL — ABNORMAL HIGH (ref 0.0–149.0)
VLDL: 36.2 mg/dL (ref 0.0–40.0)

## 2017-05-09 LAB — COMPREHENSIVE METABOLIC PANEL
ALT: 18 U/L (ref 0–53)
AST: 12 U/L (ref 0–37)
Albumin: 4.2 g/dL (ref 3.5–5.2)
Alkaline Phosphatase: 66 U/L (ref 39–117)
BUN: 16 mg/dL (ref 6–23)
CO2: 27 mEq/L (ref 19–32)
Calcium: 9.1 mg/dL (ref 8.4–10.5)
Chloride: 103 mEq/L (ref 96–112)
Creatinine, Ser: 0.95 mg/dL (ref 0.40–1.50)
GFR: 90.86 mL/min (ref >60.00)
GLUCOSE: 165 mg/dL — AB (ref 70–99)
POTASSIUM: 4.3 meq/L (ref 3.5–5.1)
SODIUM: 136 meq/L (ref 135–145)
Total Bilirubin: 0.4 mg/dL (ref 0.2–1.2)
Total Protein: 6.7 g/dL (ref 6.0–8.3)

## 2017-05-09 LAB — CBC
HEMATOCRIT: 47.7 % (ref 39.0–52.0)
HEMOGLOBIN: 16 g/dL (ref 13.0–17.0)
MCHC: 33.7 g/dL (ref 30.0–36.0)
MCV: 89.3 fl (ref 78.0–100.0)
PLATELETS: 216 10*3/uL (ref 150.0–400.0)
RBC: 5.33 Mil/uL (ref 4.22–5.81)
RDW: 13.9 % (ref 11.5–15.5)
WBC: 7.2 10*3/uL (ref 4.0–10.5)

## 2017-05-09 LAB — TSH: TSH: 3.05 u[IU]/mL (ref 0.35–4.50)

## 2017-05-09 LAB — HEMOGLOBIN A1C: HEMOGLOBIN A1C: 7.5 % — AB (ref 4.6–6.5)

## 2017-05-10 ENCOUNTER — Encounter: Payer: Self-pay | Admitting: Family Medicine

## 2017-05-10 DIAGNOSIS — Z79891 Long term (current) use of opiate analgesic: Secondary | ICD-10-CM | POA: Diagnosis not present

## 2017-06-01 ENCOUNTER — Other Ambulatory Visit: Payer: Self-pay | Admitting: Family Medicine

## 2017-06-01 ENCOUNTER — Encounter: Payer: Self-pay | Admitting: Family Medicine

## 2017-06-01 MED ORDER — CITALOPRAM HYDROBROMIDE 10 MG PO TABS: 10.0000 mg | ORAL_TABLET | Freq: Every day | ORAL | 4 refills | Status: DC

## 2017-06-26 ENCOUNTER — Telehealth: Payer: Self-pay | Admitting: Family Medicine

## 2017-06-26 NOTE — Telephone Encounter (Signed)
Called pt to schedule AWV. LVM for pt to call office to schedule appt.

## 2017-11-07 ENCOUNTER — Telehealth: Payer: Self-pay | Admitting: Family Medicine

## 2017-11-07 DIAGNOSIS — G8929 Other chronic pain: Secondary | ICD-10-CM

## 2017-11-07 DIAGNOSIS — M546 Pain in thoracic spine: Secondary | ICD-10-CM

## 2017-11-07 DIAGNOSIS — M542 Cervicalgia: Secondary | ICD-10-CM

## 2017-11-07 NOTE — Telephone Encounter (Signed)
Copied from [place] [id number]. Topic: Quick Communication - See Telephone Encounter >> Nov 07, 2017  9:40 AM Oneta Rack wrote: CRM for notification. See Telephone encounter for:   11/07/17.   Caller name: Relation to pt: spouse  Call back number: [phone]   Reason for call:  Spouse requesting HYDROcodone-ibuprofen (VICOPROFEN) 7.5-200 MG tablet refill, spouse states previous Rx was never filled and pharmacy states void after 90 days, spouse states they live a few hours away, requesting new Rx called into pharmacy, please advise

## 2017-11-07 NOTE — Telephone Encounter (Signed)
Please advise 

## 2017-11-07 NOTE — Telephone Encounter (Signed)
Requesting:Vicoprofen Contract:yes UDS:low risk next screen 11/07/17 Last OV:05/08/17 Next OV: Last Refill:   Please advise

## 2017-11-08 ENCOUNTER — Other Ambulatory Visit: Payer: Self-pay | Admitting: Family Medicine

## 2017-11-08 ENCOUNTER — Ambulatory Visit: Payer: Medicare Other | Admitting: Family Medicine

## 2017-11-08 DIAGNOSIS — M542 Cervicalgia: Secondary | ICD-10-CM

## 2017-11-08 DIAGNOSIS — G8929 Other chronic pain: Secondary | ICD-10-CM

## 2017-11-08 DIAGNOSIS — M546 Pain in thoracic spine: Secondary | ICD-10-CM

## 2017-11-08 MED ORDER — HYDROCODONE-IBUPROFEN 7.5-200 MG PO TABS: 1.0000 | ORAL_TABLET | Freq: Three times a day (TID) | ORAL | 0 refills | Status: DC | PRN

## 2017-11-08 NOTE — Telephone Encounter (Signed)
I have signed he can pick up

## 2017-11-09 ENCOUNTER — Encounter: Payer: Self-pay | Admitting: Family Medicine

## 2017-12-06 ENCOUNTER — Ambulatory Visit: Payer: Medicare Other | Admitting: Family Medicine

## 2018-01-07 ENCOUNTER — Ambulatory Visit: Payer: Self-pay | Admitting: Family Medicine

## 2018-02-04 ENCOUNTER — Encounter: Payer: Self-pay | Admitting: Family Medicine

## 2018-02-04 ENCOUNTER — Ambulatory Visit (INDEPENDENT_AMBULATORY_CARE_PROVIDER_SITE_OTHER): Payer: Medicare Other | Admitting: Family Medicine

## 2018-02-04 VITALS — BP 122/82 | HR 74 | Temp 97.6°F | Resp 18 | Wt 280.4 lb

## 2018-02-04 DIAGNOSIS — E1169 Type 2 diabetes mellitus with other specified complication: Secondary | ICD-10-CM

## 2018-02-04 DIAGNOSIS — Z79899 Other long term (current) drug therapy: Secondary | ICD-10-CM

## 2018-02-04 DIAGNOSIS — Z720 Tobacco use: Secondary | ICD-10-CM | POA: Diagnosis not present

## 2018-02-04 DIAGNOSIS — E669 Obesity, unspecified: Secondary | ICD-10-CM | POA: Diagnosis not present

## 2018-02-04 DIAGNOSIS — G8929 Other chronic pain: Secondary | ICD-10-CM

## 2018-02-04 DIAGNOSIS — K130 Diseases of lips: Secondary | ICD-10-CM | POA: Diagnosis not present

## 2018-02-04 DIAGNOSIS — R0789 Other chest pain: Secondary | ICD-10-CM | POA: Diagnosis not present

## 2018-02-04 DIAGNOSIS — R030 Elevated blood-pressure reading, without diagnosis of hypertension: Secondary | ICD-10-CM

## 2018-02-04 DIAGNOSIS — E782 Mixed hyperlipidemia: Secondary | ICD-10-CM

## 2018-02-04 DIAGNOSIS — E6609 Other obesity due to excess calories: Secondary | ICD-10-CM | POA: Diagnosis not present

## 2018-02-04 DIAGNOSIS — M546 Pain in thoracic spine: Secondary | ICD-10-CM | POA: Diagnosis not present

## 2018-02-04 DIAGNOSIS — M542 Cervicalgia: Secondary | ICD-10-CM

## 2018-02-04 MED ORDER — NITROGLYCERIN 0.4 MG SL SUBL: 0.4000 mg | SUBLINGUAL_TABLET | SUBLINGUAL | 1 refills | Status: DC | PRN

## 2018-02-04 MED ORDER — HYDROCODONE-IBUPROFEN 7.5-200 MG PO TABS: 1.0000 | ORAL_TABLET | Freq: Three times a day (TID) | ORAL | 0 refills | Status: DC | PRN

## 2018-02-04 MED ORDER — TIZANIDINE HCL 4 MG PO TABS: 4.0000 mg | ORAL_TABLET | Freq: Four times a day (QID) | ORAL | 5 refills | Status: DC | PRN

## 2018-02-04 MED ORDER — MICROLET LANCETS MISC: 5 refills | Status: AC

## 2018-02-04 NOTE — Assessment & Plan Note (Signed)
Encouraged complete cessation. Discussed need to quit as relates to risk of numerous cancers, cardiac and pulmonary disease as well as neurologic complications. Counseled for greater than 3 minutes 

## 2018-02-04 NOTE — Progress Notes (Signed)
I acted as a Education administrator for Dr. Charlett Blake. [place], [place]  Patient ID: Gary Short., male    DOB: Jul 04, 1971, 47 y.o.   MRN: [id number]  No chief complaint on file.   HPI  Patient is in today for follow up and he is accompanied by his wife and kids. He feels well today  Patient Care Team: [name], MD as PCP - General   Past Medical History:  Diagnosis Date  . Atypical chest pain 05/08/2017  . Back pain 06/13/2010   Qualifier: Diagnosis of  By: Charlett Blake MD, Sonia Baller back at work in 2002, was able to work until 2004. Ultimately had  Surgeries to his lower back twice and after complications with a spinal leak, was never able to return to work Radicular symptoms occur b/l.   Marland Kitchen Knee pain, bilateral 08/26/2014  . Medicare annual wellness visit, subsequent 05/07/2016  . Obesity, unspecified 06/13/2010   Qualifier: Diagnosis of  By: Charlett Blake MD, Erline Levine      Past Surgical History:  Procedure Laterality Date  . BACK SURGERY     X 2  . KNEE SURGERY  1999   cartilage repair    Family History  Problem Relation Age of Onset  . Arthritis Other        family hx of  . Diabetes Other        family hx of  . Hypertension Other        family hx of  . Stroke Other        family hx of  . Sudden death Other        family hx of  . Stroke Father   . Diabetes Father   . Hyperlipidemia Father   . Hypertension Father   . Cancer Mother        breast  . Diabetes Mother   . Hyperlipidemia Mother   . Hypertension Mother   . Diabetes Sister   . Arthritis Sister   . Cancer Maternal Grandmother   . Diabetes Sister     Social History   Socioeconomic History  . Marital status: Married    Spouse name: Not on file  . Number of children: Not on file  . Years of education: Not on file  . Highest education level: Not on file  Social Needs  . Financial resource strain: Not on file  . Food insecurity - worry: Not on file  . Food insecurity - inability: Not on file  . Transportation  needs - medical: Not on file  . Transportation needs - non-medical: Not on file  Occupational History  . Not on file  Tobacco Use  . Smoking status: Current Every Day Smoker    Packs/day: 1.00    Types: Cigarettes  . Smokeless tobacco: Never Used  Substance and Sexual Activity  . Alcohol use: No  . Drug use: No  . Sexual activity: Not on file  Other Topics Concern  . Not on file  Social History Narrative  . Not on file    Outpatient Medications Prior to Visit  Medication Sig Dispense Refill  . aspirin EC 81 MG tablet Take 1 tablet (81 mg total) by mouth daily.    Marland Kitchen glucose blood (BAYER CONTOUR TEST) test strip Check blood glucose once daily and as needed. Dx: E11.9 100 each 5  . metFORMIN (GLUCOPHAGE) 500 MG tablet Take 2 tablets by mouth twice daily. 120 tablet 5  . citalopram (CELEXA) 10  MG tablet Take 1 tablet (10 mg total) by mouth daily. 30 tablet 4  . HYDROcodone-ibuprofen (VICOPROFEN) 7.5-200 MG tablet Take 1 tablet by mouth every 8 (eight) hours as needed. 90 tablet 0  . MICROLET LANCETS MISC Check blood sugar DX E11.9 100 each 5  . tiZANidine (ZANAFLEX) 4 MG tablet Take 1 tablet (4 mg total) by mouth every 6 (six) hours as needed for muscle spasms. 90 tablet 5   No facility-administered medications prior to visit.     Allergies  Allergen Reactions  . Iodides     Review of Systems  Constitutional: Negative for chills, fever and malaise/fatigue.  HENT: Negative for congestion and hearing loss.   Eyes: Negative for discharge.  Respiratory: Negative for cough, sputum production and shortness of breath.   Cardiovascular: Negative for chest pain, palpitations and leg swelling.  Gastrointestinal: Negative for abdominal pain, blood in stool, constipation, diarrhea, heartburn, nausea and vomiting.  Genitourinary: Negative for dysuria, frequency, hematuria and urgency.  Musculoskeletal: Negative for back pain, falls and myalgias.  Skin: Negative for rash.  Neurological:  Negative for dizziness, sensory change, loss of consciousness, weakness and headaches.  Endo/Heme/Allergies: Negative for environmental allergies. Does not bruise/bleed easily.  Psychiatric/Behavioral: Negative for depression and suicidal ideas. The patient is not nervous/anxious and does not have insomnia.        Objective:    Physical Exam  Constitutional: He is oriented to person, place, and time. He appears well-developed and well-nourished. No distress.  HENT:  Head: Normocephalic and atraumatic.  Nose: Nose normal.  Lesion inner, lower lip, dark and round  Eyes: Right eye exhibits no discharge. Left eye exhibits no discharge.  Neck: Normal range of motion. Neck supple.  Cardiovascular: Normal rate and regular rhythm.  No murmur heard. Pulmonary/Chest: Effort normal and breath sounds normal.  Abdominal: Soft. Bowel sounds are normal. There is no tenderness.  Musculoskeletal: He exhibits no edema.  Neurological: He is alert and oriented to person, place, and time.  Skin: Skin is warm and dry.  Psychiatric: He has a normal mood and affect.  Nursing note and vitals reviewed.   BP 122/82 (BP Location: Left Arm, Patient Position: Sitting, Cuff Size: Normal)   Pulse 74   Temp 97.6 F (36.4 C) (Oral)   Resp 18   Wt 280 lb 6.4 oz (127.2 kg)   SpO2 100%   BMI 36.00 kg/m  Wt Readings from Last 3 Encounters:  02/04/18 280 lb 6.4 oz (127.2 kg)  11/02/16 286 lb 9.6 oz (130 kg)  04/27/16 289 lb 4 oz (131.2 kg)   BP Readings from Last 3 Encounters:  02/04/18 122/82  05/08/17 118/86  11/02/16 128/82     Immunization History  Administered Date(s) Administered  . Influenza,inj,Quad PF,6+ Mos 12/24/2014, 10/26/2015, 11/02/2016  . Pneumococcal Conjugate-13 04/27/2016  . Tdap 12/24/2014    Health Maintenance  Topic Date Due  . PNEUMOCOCCAL POLYSACCHARIDE VACCINE (1) 09/18/1973  . FOOT EXAM  09/18/1981  . OPHTHALMOLOGY EXAM  09/18/1981  . HIV Screening  09/18/1986  . URINE  MICROALBUMIN  04/27/2017  . INFLUENZA VACCINE  06/27/2017  . HEMOGLOBIN A1C  11/07/2017  . TETANUS/TDAP  12/24/2024    Lab Results  Component Value Date   WBC 7.2 05/08/2017   HGB 16.0 05/08/2017   HCT 47.7 05/08/2017   PLT 216.0 05/08/2017   GLUCOSE 165 (H) 05/08/2017   CHOL 188 05/08/2017   TRIG 181.0 (H) 05/08/2017   HDL 46.30 05/08/2017   LDLDIRECT 108.0 11/02/2016  LDLCALC 106 (H) 05/08/2017   ALT 18 05/08/2017   AST 12 05/08/2017   NA 136 05/08/2017   K 4.3 05/08/2017   CL 103 05/08/2017   CREATININE 0.95 05/08/2017   BUN 16 05/08/2017   CO2 27 05/08/2017   TSH 3.05 05/08/2017   HGBA1C 7.5 (H) 05/08/2017   MICROALBUR 0.9 04/27/2016    Lab Results  Component Value Date   TSH 3.05 05/08/2017   Lab Results  Component Value Date   WBC 7.2 05/08/2017   HGB 16.0 05/08/2017   HCT 47.7 05/08/2017   MCV 89.3 05/08/2017   PLT 216.0 05/08/2017   Lab Results  Component Value Date   NA 136 05/08/2017   K 4.3 05/08/2017   CO2 27 05/08/2017   GLUCOSE 165 (H) 05/08/2017   BUN 16 05/08/2017   CREATININE 0.95 05/08/2017   BILITOT 0.4 05/08/2017   ALKPHOS 66 05/08/2017   AST 12 05/08/2017   ALT 18 05/08/2017   PROT 6.7 05/08/2017   ALBUMIN 4.2 05/08/2017   CALCIUM 9.1 05/08/2017   GFR 90.86 05/08/2017   Lab Results  Component Value Date   CHOL 188 05/08/2017   Lab Results  Component Value Date   HDL 46.30 05/08/2017   Lab Results  Component Value Date   LDLCALC 106 (H) 05/08/2017   Lab Results  Component Value Date   TRIG 181.0 (H) 05/08/2017   Lab Results  Component Value Date   CHOLHDL 4 05/08/2017   Lab Results  Component Value Date   HGBA1C 7.5 (H) 05/08/2017         Assessment & Plan:   Problem List Items Addressed This Visit    MIXED HYPERLIPIDEMIA    Encouraged heart healthy diet, increase exercise, avoid trans fats, consider a krill oil cap daily      Relevant Medications   nitroGLYCERIN (NITROSTAT) 0.4 MG SL tablet   Other  Relevant Orders   Ambulatory referral to Cardiology   Comprehensive metabolic panel   Lipid panel   TSH   Obesity    Encouraged DASH diet, decrease po intake and increase exercise as tolerated. Needs 7-8 hours of sleep nightly. Avoid trans fats, eat small, frequent meals every 4-5 hours with lean proteins, complex carbs and healthy fats. Minimize simple carbs      Back pain   Relevant Medications   tiZANidine (ZANAFLEX) 4 MG tablet   HYDROcodone-ibuprofen (VICOPROFEN) 7.5-200 MG tablet   Other Relevant Orders   Comprehensive metabolic panel   Comprehensive metabolic panel   ELEVATED BLOOD PRESSURE WITHOUT DIAGNOSIS OF HYPERTENSION   Relevant Medications   MICROLET LANCETS MISC   Other Relevant Orders   Comprehensive metabolic panel   TSH   Diabetes mellitus type 2 in obese (HCC)     minimize simple carbs. Increase exercise as tolerated. Stop Metformin due to diarrhea persistent start glimeride and report sugars if still running above 200 may need to add insulin. Offered referral to endocrinology closer to his home but he declines      Relevant Orders   Ambulatory referral to Cardiology   Hemoglobin A1c   Comprehensive metabolic panel   TSH   Atypical chest pain    Describes episodes several times a week with chest pressure, anxiety and sob. He notes some radiating down his arm at times. EKG unremarkable but symptoms concerning is referred to a cardiologist closer to his home.       Relevant Orders   Ambulatory referral to Cardiology   EKG  12-Lead (Completed)   CBC   Comprehensive metabolic panel   TSH   Lip lesion    Dark lesion on lower lip referred to dermatology for further consdderation      Relevant Orders   Ambulatory referral to Dermatology   Comprehensive metabolic panel   Tobacco abuse    Encouraged complete cessation. Discussed need to quit as relates to risk of numerous cancers, cardiac and pulmonary disease as well as neurologic complications. Counseled  for greater than 3 minutes       Other Visit Diagnoses    High risk medication use    -  Primary   Relevant Orders   Pain Mgmt, Profile 8 w/Conf, U   Comprehensive metabolic panel   Neck pain       Relevant Medications   HYDROcodone-ibuprofen (VICOPROFEN) 7.5-200 MG tablet   Other Relevant Orders   Comprehensive metabolic panel   Bilateral thoracic back pain       Relevant Medications   tiZANidine (ZANAFLEX) 4 MG tablet   HYDROcodone-ibuprofen (VICOPROFEN) 7.5-200 MG tablet   Other Relevant Orders   Comprehensive metabolic panel   Comprehensive metabolic panel   Comprehensive metabolic panel   Thoracic back pain       Relevant Medications   tiZANidine (ZANAFLEX) 4 MG tablet   HYDROcodone-ibuprofen (VICOPROFEN) 7.5-200 MG tablet   Other Relevant Orders   Comprehensive metabolic panel   Comprehensive metabolic panel   Comprehensive metabolic panel      I have discontinued Guy Franco Jr.'s citalopram. I am also having him start on nitroGLYCERIN. Additionally, I am having him maintain his glucose blood, metFORMIN, aspirin EC, tiZANidine, HYDROcodone-ibuprofen, and MICROLET LANCETS.  Meds ordered this encounter  Medications  . tiZANidine (ZANAFLEX) 4 MG tablet    Sig: Take 1 tablet (4 mg total) by mouth every 6 (six) hours as needed for muscle spasms.    Dispense:  90 tablet    Refill:  5  . HYDROcodone-ibuprofen (VICOPROFEN) 7.5-200 MG tablet    Sig: Take 1 tablet by mouth every 8 (eight) hours as needed.    Dispense:  90 tablet    Refill:  0  . MICROLET LANCETS MISC    Sig: Check blood sugar DX E11.9    Dispense:  100 each    Refill:  5  . nitroGLYCERIN (NITROSTAT) 0.4 MG SL tablet    Sig: Place 1 tablet (0.4 mg total) under the tongue every 5 (five) minutes as needed for chest pain.    Dispense:  25 tablet    Refill:  1    CMA served as scribe during this visit. History, Physical and Plan performed by medical provider. Documentation and orders reviewed and  attested to.  Penni Homans, MD

## 2018-02-04 NOTE — Assessment & Plan Note (Signed)
Encouraged DASH diet, decrease po intake and increase exercise as tolerated. Needs 7-8 hours of sleep nightly. Avoid trans fats, eat small, frequent meals every 4-5 hours with lean proteins, complex carbs and healthy fats. Minimize simple carbs 

## 2018-02-04 NOTE — Assessment & Plan Note (Signed)
Dark lesion on lower lip referred to dermatology for further consdderation

## 2018-02-04 NOTE — Assessment & Plan Note (Signed)
Describes episodes several times a week with chest pressure, anxiety and sob. He notes some radiating down his arm at times. EKG unremarkable but symptoms concerning is referred to a cardiologist closer to his home.

## 2018-02-04 NOTE — Assessment & Plan Note (Signed)
Encouraged heart healthy diet, increase exercise, avoid trans fats, consider a krill oil cap daily 

## 2018-02-04 NOTE — Patient Instructions (Signed)

## 2018-02-04 NOTE — Assessment & Plan Note (Addendum)
minimize simple carbs. Increase exercise as tolerated. Stop Metformin due to diarrhea persistent start glimeride and report sugars if still running above 200 may need to add insulin. Offered referral to endocrinology closer to his home but he declines

## 2018-02-05 ENCOUNTER — Telehealth: Payer: Self-pay

## 2018-02-05 DIAGNOSIS — E669 Obesity, unspecified: Secondary | ICD-10-CM

## 2018-02-05 DIAGNOSIS — M542 Cervicalgia: Secondary | ICD-10-CM

## 2018-02-05 DIAGNOSIS — G8929 Other chronic pain: Secondary | ICD-10-CM

## 2018-02-05 DIAGNOSIS — E782 Mixed hyperlipidemia: Secondary | ICD-10-CM

## 2018-02-05 DIAGNOSIS — E1169 Type 2 diabetes mellitus with other specified complication: Secondary | ICD-10-CM

## 2018-02-05 DIAGNOSIS — M546 Pain in thoracic spine: Secondary | ICD-10-CM

## 2018-02-05 DIAGNOSIS — R030 Elevated blood-pressure reading, without diagnosis of hypertension: Secondary | ICD-10-CM

## 2018-02-05 LAB — COMPREHENSIVE METABOLIC PANEL
ALBUMIN: 4 g/dL (ref 3.5–5.2)
ALK PHOS: 74 U/L (ref 39–117)
ALT: 26 U/L (ref 0–53)
AST: 11 U/L (ref 0–37)
BUN: 13 mg/dL (ref 6–23)
CALCIUM: 9.1 mg/dL (ref 8.4–10.5)
CO2: 28 mEq/L (ref 19–32)
Chloride: 95 mEq/L — ABNORMAL LOW (ref 96–112)
Creatinine, Ser: 1.07 mg/dL (ref 0.40–1.50)
GFR: 78.95 mL/min (ref >60.00)
Glucose, Bld: 438 mg/dL — ABNORMAL HIGH (ref 70–99)
POTASSIUM: 4.3 meq/L (ref 3.5–5.1)
Sodium: 131 mEq/L — ABNORMAL LOW (ref 135–145)
TOTAL PROTEIN: 6.7 g/dL (ref 6.0–8.3)
Total Bilirubin: 0.5 mg/dL (ref 0.2–1.2)

## 2018-02-05 LAB — LIPID PANEL
CHOLESTEROL: 156 mg/dL (ref 0–200)
HDL: 43.6 mg/dL (ref >39.00)
NonHDL: 111.95
TRIGLYCERIDES: 256 mg/dL — AB (ref 0.0–149.0)
Total CHOL/HDL Ratio: 4
VLDL: 51.2 mg/dL — AB (ref 0.0–40.0)

## 2018-02-05 LAB — CBC
HEMATOCRIT: 45.5 % (ref 39.0–52.0)
Hemoglobin: 15.2 g/dL (ref 13.0–17.0)
MCHC: 33.3 g/dL (ref 30.0–36.0)
MCV: 90.4 fl (ref 78.0–100.0)
Platelets: 198 10*3/uL (ref 150.0–400.0)
RBC: 5.03 Mil/uL (ref 4.22–5.81)
RDW: 13.5 % (ref 11.5–15.5)
WBC: 10.7 10*3/uL — AB (ref 4.0–10.5)

## 2018-02-05 LAB — HEMOGLOBIN A1C: Hgb A1c MFr Bld: 10.3 % — ABNORMAL HIGH (ref 4.6–6.5)

## 2018-02-05 LAB — LDL CHOLESTEROL, DIRECT: Direct LDL: 85 mg/dL

## 2018-02-05 LAB — TSH: TSH: 1.67 u[IU]/mL (ref 0.35–4.50)

## 2018-02-05 MED ORDER — TIZANIDINE HCL 4 MG PO TABS: 4.0000 mg | ORAL_TABLET | Freq: Four times a day (QID) | ORAL | 5 refills | Status: DC | PRN

## 2018-02-05 MED ORDER — METFORMIN HCL 500 MG PO TABS: ORAL_TABLET | ORAL | 5 refills | Status: DC

## 2018-02-05 MED ORDER — NITROGLYCERIN 0.4 MG SL SUBL: 0.4000 mg | SUBLINGUAL_TABLET | SUBLINGUAL | 1 refills | Status: DC | PRN

## 2018-02-05 NOTE — Telephone Encounter (Signed)
If you could call and cancelt he other rx then load up the refill for me then I will eprescribe. I do not leave til the evening of 3/13. Thanks

## 2018-02-05 NOTE — Telephone Encounter (Signed)
Pt's wife sent MyChart message- they have switched from [facility] to [facility] in [facility]- needing hydrocodone resent to correct pharmacy. Please advise.

## 2018-02-06 ENCOUNTER — Other Ambulatory Visit: Payer: Self-pay | Admitting: Family Medicine

## 2018-02-06 DIAGNOSIS — M546 Pain in thoracic spine: Secondary | ICD-10-CM

## 2018-02-06 DIAGNOSIS — G8929 Other chronic pain: Secondary | ICD-10-CM

## 2018-02-06 DIAGNOSIS — M542 Cervicalgia: Secondary | ICD-10-CM

## 2018-02-06 MED ORDER — HYDROCODONE-IBUPROFEN 7.5-200 MG PO TABS: 1.0000 | ORAL_TABLET | Freq: Three times a day (TID) | ORAL | 0 refills | Status: DC | PRN

## 2018-02-06 NOTE — Telephone Encounter (Signed)
Pt's wife has already cancelled script at [facility]- I have pended.

## 2018-02-06 NOTE — Telephone Encounter (Signed)
thanks

## 2018-02-07 LAB — PAIN MGMT, PROFILE 8 W/CONF, U
6 Acetylmorphine: NEGATIVE ng/mL (ref <10)
ALCOHOL METABOLITES: NEGATIVE ng/mL (ref <500)
AMPHETAMINES: NEGATIVE ng/mL (ref <500)
Benzodiazepines: NEGATIVE ng/mL (ref <100)
Buprenorphine, Urine: NEGATIVE ng/mL (ref <5)
COCAINE METABOLITE: NEGATIVE ng/mL (ref <150)
CODEINE: NEGATIVE ng/mL (ref <50)
CREATININE: 50.2 mg/dL
HYDROMORPHONE: 85 ng/mL — AB (ref <50)
Hydrocodone: 150 ng/mL — ABNORMAL HIGH (ref <50)
MARIJUANA METABOLITE: NEGATIVE ng/mL (ref <20)
MDMA: NEGATIVE ng/mL (ref <500)
MORPHINE: NEGATIVE ng/mL (ref <50)
Norhydrocodone: 67 ng/mL — ABNORMAL HIGH (ref <50)
OXIDANT: NEGATIVE ug/mL (ref <200)
Opiates: POSITIVE ng/mL — AB (ref <100)
Oxycodone: NEGATIVE ng/mL (ref <100)
PH: 5.55 (ref 4.5–9.0)

## 2018-02-11 ENCOUNTER — Telehealth: Payer: Self-pay | Admitting: *Deleted

## 2018-02-11 NOTE — Telephone Encounter (Signed)
Received letter for Certification of Disability for Property Tax Exclusion [[place]] from patient, completed as much as possible; forwarded to provider/SLS 03/18

## 2018-02-12 ENCOUNTER — Encounter: Payer: Self-pay | Admitting: Family Medicine

## 2018-02-13 ENCOUNTER — Other Ambulatory Visit: Payer: Self-pay | Admitting: Family Medicine

## 2018-02-13 MED ORDER — GLIMEPIRIDE 4 MG PO TABS
4.0000 mg | ORAL_TABLET | Freq: Two times a day (BID) | ORAL | 3 refills | Status: DC
Start: 2018-02-13 — End: 2019-01-08

## 2018-02-13 NOTE — Telephone Encounter (Signed)
Recent office visit patient was told to stop metformin due to diarrhea and start glimepiride. Looking in his med list it looks like glimepiride was not sent in. Please advise.

## 2018-02-19 ENCOUNTER — Telehealth: Payer: Self-pay

## 2018-02-19 NOTE — Telephone Encounter (Signed)
Copied from [place] [id number]. Topic: Quick Communication - See Telephone Encounter >> Feb 18, 2018  8:53 AM Antonieta Iba C wrote: CRM for notification. See Telephone encounter for: 02/18/18.   [facility] pharmacist called in to get further information about why pt is taking controlled medication. She says in system its not showing that pt has ever taken a controlled medication.  Please advise

## 2018-02-20 NOTE — Telephone Encounter (Signed)
Patient's pharmacy-[facility] calling in regards to Hydrocodone-ibuprofen rx. Patient requested refill, pharmacy's controlled substance registry was not showing in their system where this patient has ever been on this medication. I explain to them he has been taking this medication for chronic moderate pain since 01/23/13-present. Pharmacist states he wasn't even showing up in their system and asked if it was possible he was filling in another state? She states she was going to call the registry's number and if it works out through their end He should be able to get the full quantity rather than 5 pills since he has been taking it regularly/less than 6 month break. PCP and [place] out of office today, unable to speak to them directly. Pharmacy states they have been trying to get in touch since 02/18/18.

## 2018-02-20 NOTE — Telephone Encounter (Signed)
Please check with pharmacy about status or prescription

## 2018-02-28 ENCOUNTER — Other Ambulatory Visit: Payer: Self-pay

## 2018-02-28 DIAGNOSIS — G8929 Other chronic pain: Secondary | ICD-10-CM

## 2018-02-28 DIAGNOSIS — M546 Pain in thoracic spine: Secondary | ICD-10-CM

## 2018-02-28 DIAGNOSIS — M542 Cervicalgia: Secondary | ICD-10-CM

## 2018-02-28 MED ORDER — HYDROCODONE-IBUPROFEN 7.5-200 MG PO TABS: 1.0000 | ORAL_TABLET | Freq: Three times a day (TID) | ORAL | 0 refills | Status: DC | PRN

## 2018-02-28 NOTE — Telephone Encounter (Signed)
Requesting: Hydrocodone Contract:yes UDS:low risk next screen 08/07/18 Last OV:02/04/18 Next OV:05/14/18 Last Refill:02/04/18  Only received 5 days supply  Database:   Please advise

## 2018-03-05 NOTE — Telephone Encounter (Signed)
Rx sent in and updated

## 2018-03-21 DIAGNOSIS — D3701 Neoplasm of uncertain behavior of lip: Secondary | ICD-10-CM | POA: Diagnosis not present

## 2018-03-25 DIAGNOSIS — L814 Other melanin hyperpigmentation: Secondary | ICD-10-CM | POA: Diagnosis not present

## 2018-04-12 DIAGNOSIS — D229 Melanocytic nevi, unspecified: Secondary | ICD-10-CM | POA: Diagnosis not present

## 2018-05-14 ENCOUNTER — Ambulatory Visit (INDEPENDENT_AMBULATORY_CARE_PROVIDER_SITE_OTHER): Payer: Medicare Other | Admitting: Family Medicine

## 2018-05-14 VITALS — BP 125/83 | HR 69 | Temp 97.9°F | Resp 18 | Ht 74.0 in | Wt 283.0 lb

## 2018-05-14 DIAGNOSIS — Z720 Tobacco use: Secondary | ICD-10-CM | POA: Diagnosis not present

## 2018-05-14 DIAGNOSIS — M549 Dorsalgia, unspecified: Secondary | ICD-10-CM

## 2018-05-14 DIAGNOSIS — F418 Other specified anxiety disorders: Secondary | ICD-10-CM

## 2018-05-14 DIAGNOSIS — E782 Mixed hyperlipidemia: Secondary | ICD-10-CM | POA: Diagnosis not present

## 2018-05-14 DIAGNOSIS — E1169 Type 2 diabetes mellitus with other specified complication: Secondary | ICD-10-CM | POA: Diagnosis not present

## 2018-05-14 DIAGNOSIS — E669 Obesity, unspecified: Secondary | ICD-10-CM

## 2018-05-14 DIAGNOSIS — Z23 Encounter for immunization: Secondary | ICD-10-CM | POA: Diagnosis not present

## 2018-05-14 DIAGNOSIS — E6609 Other obesity due to excess calories: Secondary | ICD-10-CM | POA: Diagnosis not present

## 2018-05-14 DIAGNOSIS — G43009 Migraine without aura, not intractable, without status migrainosus: Secondary | ICD-10-CM

## 2018-05-14 NOTE — Assessment & Plan Note (Signed)
hgba1c acceptable, minimize simple carbs. Increase exercise as tolerated. Continue current meds 

## 2018-05-14 NOTE — Patient Instructions (Signed)

## 2018-05-14 NOTE — Assessment & Plan Note (Signed)
Encouraged complete cessation. Discussed need to quit as relates to risk of numerous cancers, cardiac and pulmonary disease as well as neurologic complications. Counseled for greater than 3 minutes. Smokes about a ppd

## 2018-05-14 NOTE — Assessment & Plan Note (Signed)
Encouraged DASH diet, decrease po intake and increase exercise as tolerated. Needs 7-8 hours of sleep nightly. Avoid trans fats, eat small, frequent meals every 4-5 hours with lean proteins, complex carbs and healthy fats. Minimize simple carbs, 

## 2018-05-14 NOTE — Assessment & Plan Note (Signed)
Manageable at this time. No changes. Encouraged increased hydration, 64 ounces of clear fluids daily. Minimize alcohol and caffeine. Eat small frequent meals with lean proteins and complex carbs. Avoid high and low blood sugars. Get adequate sleep, 7-8 hours a night. Needs exercise daily preferably in the morning.

## 2018-05-14 NOTE — Assessment & Plan Note (Signed)
Encouraged heart healthy diet, increase exercise, avoid trans fats, consider a krill oil cap daily 

## 2018-05-14 NOTE — Assessment & Plan Note (Signed)
Encouraged moist heat and gentle stretching as tolerated. May try NSAIDs and prescription meds as directed and report if symptoms worsen or seek immediate care. Declines referral to ortho closer to home.

## 2018-05-14 NOTE — Assessment & Plan Note (Signed)
Stable currently no changes

## 2018-05-14 NOTE — Progress Notes (Signed)
[phone]    Subjective:  I acted as a Education administrator for Dr. Charlett Blake. [place], [place]  Patient ID: Gary Short., male    DOB: 1970-12-13, 47 y.o.   MRN: [id number]  No chief complaint on file.   HPI  Patient is in today for an annual exam and follow up on chronic medical concerns including chronic back pain, arthralgias and myalgias. No recent febrile illness or hospitalizations. Is trying to maintain a heart healthy diet. Does well with activities of daily living. Denies CP/palp/SOB/HA/congestion/fevers/GI or GU c/o. Taking meds as prescribed  Patient Care Team: [name], MD as PCP - General   Past Medical History:  Diagnosis Date  . Atypical chest pain 05/08/2017  . Back pain 06/13/2010   Qualifier: Diagnosis of  By: Charlett Blake MD, Sonia Baller back at work in 2002, was able to work until 2004. Ultimately had  Surgeries to his lower back twice and after complications with a spinal leak, was never able to return to work Radicular symptoms occur b/l.   Marland Kitchen Knee pain, bilateral 08/26/2014  . Medicare annual wellness visit, subsequent 05/07/2016  . Obesity, unspecified 06/13/2010   Qualifier: Diagnosis of  By: Charlett Blake MD, Erline Levine      Past Surgical History:  Procedure Laterality Date  . BACK SURGERY     X 2  . KNEE SURGERY  1999   cartilage repair    Family History  Problem Relation Age of Onset  . Arthritis Other        family hx of  . Diabetes Other        family hx of  . Hypertension Other        family hx of  . Stroke Other        family hx of  . Sudden death Other        family hx of  . Stroke Father   . Diabetes Father   . Hyperlipidemia Father   . Hypertension Father   . Cancer Mother        breast  . Diabetes Mother   . Hyperlipidemia Mother   . Hypertension Mother   . Diabetes Sister   . Arthritis Sister   . Cancer Maternal Grandmother   . Diabetes Sister     Social History   Socioeconomic History  . Marital status: Married    Spouse name: Not on file  . Number  of children: Not on file  . Years of education: Not on file  . Highest education level: Not on file  Occupational History  . Not on file  Social Needs  . Financial resource strain: Not on file  . Food insecurity:    Worry: Not on file    Inability: Not on file  . Transportation needs:    Medical: Not on file    Non-medical: Not on file  Tobacco Use  . Smoking status: Current Every Day Smoker    Packs/day: 1.00    Types: Cigarettes  . Smokeless tobacco: Never Used  Substance and Sexual Activity  . Alcohol use: No  . Drug use: No  . Sexual activity: Not on file  Lifestyle  . Physical activity:    Days per week: Not on file    Minutes per session: Not on file  . Stress: Not on file  Relationships  . Social connections:    Talks on phone: Not on file    Gets together: Not on file    Attends religious  service: Not on file    Active member of club or organization: Not on file    Attends meetings of clubs or organizations: Not on file    Relationship status: Not on file  . Intimate partner violence:    Fear of current or ex partner: Not on file    Emotionally abused: Not on file    Physically abused: Not on file    Forced sexual activity: Not on file  Other Topics Concern  . Not on file  Social History Narrative  . Not on file    Outpatient Medications Prior to Visit  Medication Sig Dispense Refill  . aspirin EC 81 MG tablet Take 1 tablet (81 mg total) by mouth daily.    Marland Kitchen glimepiride (AMARYL) 4 MG tablet Take 1 tablet (4 mg total) by mouth 2 (two) times daily. 60 tablet 3  . glucose blood (BAYER CONTOUR TEST) test strip Check blood glucose once daily and as needed. Dx: E11.9 100 each 5  . HYDROcodone-ibuprofen (VICOPROFEN) 7.5-200 MG tablet Take 1 tablet by mouth every 8 (eight) hours as needed for moderate pain. 90 tablet 0  . MICROLET LANCETS MISC Check blood sugar DX E11.9 100 each 5  . nitroGLYCERIN (NITROSTAT) 0.4 MG SL tablet Place 1 tablet (0.4 mg total) under  the tongue every 5 (five) minutes as needed for chest pain. 25 tablet 1  . tiZANidine (ZANAFLEX) 4 MG tablet Take 1 tablet (4 mg total) by mouth every 6 (six) hours as needed for muscle spasms. 90 tablet 5   No facility-administered medications prior to visit.     Allergies  Allergen Reactions  . Iodides     Review of Systems  Constitutional: Negative for chills, fever and malaise/fatigue.  HENT: Negative for congestion and hearing loss.   Eyes: Negative for discharge.  Respiratory: Negative for cough, sputum production and shortness of breath.   Cardiovascular: Positive for PND. Negative for chest pain, palpitations and leg swelling.  Gastrointestinal: Negative for abdominal pain, blood in stool, constipation, diarrhea, heartburn, nausea and vomiting.  Genitourinary: Negative for dysuria, frequency, hematuria and urgency.  Musculoskeletal: Positive for back pain and myalgias. Negative for falls.  Skin: Negative for rash.  Neurological: Negative for dizziness, sensory change, loss of consciousness, weakness and headaches.  Endo/Heme/Allergies: Negative for environmental allergies. Does not bruise/bleed easily.  Psychiatric/Behavioral: Negative for depression and suicidal ideas. The patient is not nervous/anxious and does not have insomnia.        Objective:    Physical Exam  Constitutional: He is oriented to person, place, and time. He appears well-developed and well-nourished. No distress.  HENT:  Head: Normocephalic and atraumatic.  Eyes: Conjunctivae are normal.  Neck: Neck supple. No thyromegaly present.  Cardiovascular: Normal rate, regular rhythm and normal heart sounds.  No murmur heard. Pulmonary/Chest: Effort normal and breath sounds normal. No respiratory distress. He has no wheezes.  Abdominal: Soft. Bowel sounds are normal. He exhibits no mass. There is no tenderness.  Musculoskeletal: He exhibits no edema.  Lymphadenopathy:    He has no cervical adenopathy.    Neurological: He is alert and oriented to person, place, and time.  Skin: Skin is warm and dry.  Psychiatric: He has a normal mood and affect. His behavior is normal.    BP 125/83 (BP Location: Left Arm, Patient Position: Sitting, Cuff Size: Large)   Pulse 69   Temp 97.9 F (36.6 C) (Oral)   Resp 18   Ht 6\' 2"  (1.88 m)  Wt 283 lb (128.4 kg)   SpO2 100%   BMI 36.34 kg/m  Wt Readings from Last 3 Encounters:  05/14/18 283 lb (128.4 kg)  02/04/18 280 lb 6.4 oz (127.2 kg)  11/02/16 286 lb 9.6 oz (130 kg)   BP Readings from Last 3 Encounters:  05/14/18 125/83  02/04/18 122/82  05/08/17 118/86     Immunization History  Administered Date(s) Administered  . Influenza,inj,Quad PF,6+ Mos 12/24/2014, 10/26/2015, 11/02/2016  . Pneumococcal Conjugate-13 04/27/2016  . Pneumococcal Polysaccharide-23 05/14/2018  . Tdap 12/24/2014    Health Maintenance  Topic Date Due  . FOOT EXAM  09/18/1981  . OPHTHALMOLOGY EXAM  09/18/1981  . HIV Screening  09/18/1986  . URINE MICROALBUMIN  04/27/2017  . INFLUENZA VACCINE  06/27/2018  . HEMOGLOBIN A1C  08/07/2018  . PNEUMOCOCCAL POLYSACCHARIDE VACCINE (2) 05/15/2023  . TETANUS/TDAP  12/24/2024    Lab Results  Component Value Date   WBC 8.4 05/14/2018   HGB 16.0 05/14/2018   HCT 47.4 05/14/2018   PLT 227.0 05/14/2018   GLUCOSE 121 (H) 05/14/2018   CHOL 197 05/14/2018   TRIG 268.0 (H) 05/14/2018   HDL 44.90 05/14/2018   LDLDIRECT 123.0 05/14/2018   LDLCALC 106 (H) 05/08/2017   ALT 17 05/14/2018   AST 12 05/14/2018   NA 137 05/14/2018   K 4.2 05/14/2018   CL 102 05/14/2018   CREATININE 0.94 05/14/2018   BUN 13 05/14/2018   CO2 28 05/14/2018   TSH 2.10 05/14/2018   HGBA1C 7.4 (H) 05/14/2018   MICROALBUR 0.9 04/27/2016    Lab Results  Component Value Date   TSH 2.10 05/14/2018   Lab Results  Component Value Date   WBC 8.4 05/14/2018   HGB 16.0 05/14/2018   HCT 47.4 05/14/2018   MCV 89.9 05/14/2018   PLT 227.0 05/14/2018    Lab Results  Component Value Date   NA 137 05/14/2018   K 4.2 05/14/2018   CO2 28 05/14/2018   GLUCOSE 121 (H) 05/14/2018   BUN 13 05/14/2018   CREATININE 0.94 05/14/2018   BILITOT 0.4 05/14/2018   ALKPHOS 64 05/14/2018   AST 12 05/14/2018   ALT 17 05/14/2018   PROT 6.6 05/14/2018   ALBUMIN 4.1 05/14/2018   CALCIUM 9.3 05/14/2018   GFR 91.57 05/14/2018   Lab Results  Component Value Date   CHOL 197 05/14/2018   Lab Results  Component Value Date   HDL 44.90 05/14/2018   Lab Results  Component Value Date   LDLCALC 106 (H) 05/08/2017   Lab Results  Component Value Date   TRIG 268.0 (H) 05/14/2018   Lab Results  Component Value Date   CHOLHDL 4 05/14/2018   Lab Results  Component Value Date   HGBA1C 7.4 (H) 05/14/2018         Assessment & Plan:   Problem List Items Addressed This Visit    MIXED HYPERLIPIDEMIA    Encouraged heart healthy diet, increase exercise, avoid trans fats, consider a krill oil cap daily      Relevant Orders   Lipid panel (Completed)   Obesity - Primary    Encouraged DASH diet, decrease po intake and increase exercise as tolerated. Needs 7-8 hours of sleep nightly. Avoid trans fats, eat small, frequent meals every 4-5 hours with lean proteins, complex carbs and healthy fats. Minimize simple carbs,      Relevant Orders   TSH (Completed)   Depression with anxiety    Stable currently no changes  Migraine without aura    Manageable at this time. No changes. Encouraged increased hydration, 64 ounces of clear fluids daily. Minimize alcohol and caffeine. Eat small frequent meals with lean proteins and complex carbs. Avoid high and low blood sugars. Get adequate sleep, 7-8 hours a night. Needs exercise daily preferably in the morning.      Relevant Orders   CBC with Differential/Platelet (Completed)   TSH (Completed)   Back pain    Encouraged moist heat and gentle stretching as tolerated. May try NSAIDs and prescription meds as  directed and report if symptoms worsen or seek immediate care. Declines referral to ortho closer to home.       Diabetes mellitus type 2 in obese (HCC)    hgba1c acceptable, minimize simple carbs. Increase exercise as tolerated. Continue current meds      Relevant Orders   Hemoglobin A1c (Completed)   Comprehensive metabolic panel (Completed)   TSH (Completed)   Tobacco abuse    Encouraged complete cessation. Discussed need to quit as relates to risk of numerous cancers, cardiac and pulmonary disease as well as neurologic complications. Counseled for greater than 3 minutes. Smokes about a ppd         I am having Gary Short. maintain his glucose blood, aspirin EC, MICROLET LANCETS, tiZANidine, nitroGLYCERIN, glimepiride, and HYDROcodone-ibuprofen.  No orders of the defined types were placed in this encounter.   CMA served as Education administrator during this visit. History, Physical and Plan performed by medical provider. Documentation and orders reviewed and attested to.  Penni Homans, MD

## 2018-05-15 LAB — COMPREHENSIVE METABOLIC PANEL
ALBUMIN: 4.1 g/dL (ref 3.5–5.2)
ALK PHOS: 64 U/L (ref 39–117)
ALT: 17 U/L (ref 0–53)
AST: 12 U/L (ref 0–37)
BUN: 13 mg/dL (ref 6–23)
CALCIUM: 9.3 mg/dL (ref 8.4–10.5)
CO2: 28 mEq/L (ref 19–32)
Chloride: 102 mEq/L (ref 96–112)
Creatinine, Ser: 0.94 mg/dL (ref 0.40–1.50)
GFR: 91.57 mL/min (ref >60.00)
Glucose, Bld: 121 mg/dL — ABNORMAL HIGH (ref 70–99)
POTASSIUM: 4.2 meq/L (ref 3.5–5.1)
Sodium: 137 mEq/L (ref 135–145)
Total Bilirubin: 0.4 mg/dL (ref 0.2–1.2)
Total Protein: 6.6 g/dL (ref 6.0–8.3)

## 2018-05-15 LAB — CBC WITH DIFFERENTIAL/PLATELET
BASOS ABS: 0.1 10*3/uL (ref 0.0–0.1)
Basophils Relative: 0.8 % (ref 0.0–3.0)
EOS ABS: 0.2 10*3/uL (ref 0.0–0.7)
Eosinophils Relative: 2.1 % (ref 0.0–5.0)
HEMATOCRIT: 47.4 % (ref 39.0–52.0)
HEMOGLOBIN: 16 g/dL (ref 13.0–17.0)
LYMPHS PCT: 36.1 % (ref 12.0–46.0)
Lymphs Abs: 3 10*3/uL (ref 0.7–4.0)
MCHC: 33.7 g/dL (ref 30.0–36.0)
MCV: 89.9 fl (ref 78.0–100.0)
Monocytes Absolute: 0.6 10*3/uL (ref 0.1–1.0)
Monocytes Relative: 6.8 % (ref 3.0–12.0)
Neutro Abs: 4.6 10*3/uL (ref 1.4–7.7)
Neutrophils Relative %: 54.2 % (ref 43.0–77.0)
PLATELETS: 227 10*3/uL (ref 150.0–400.0)
RBC: 5.27 Mil/uL (ref 4.22–5.81)
RDW: 13.8 % (ref 11.5–15.5)
WBC: 8.4 10*3/uL (ref 4.0–10.5)

## 2018-05-15 LAB — LIPID PANEL
CHOLESTEROL: 197 mg/dL (ref 0–200)
HDL: 44.9 mg/dL (ref >39.00)
NonHDL: 152.08
TRIGLYCERIDES: 268 mg/dL — AB (ref 0.0–149.0)
Total CHOL/HDL Ratio: 4
VLDL: 53.6 mg/dL — AB (ref 0.0–40.0)

## 2018-05-15 LAB — TSH: TSH: 2.1 u[IU]/mL (ref 0.35–4.50)

## 2018-05-15 LAB — HEMOGLOBIN A1C: HEMOGLOBIN A1C: 7.4 % — AB (ref 4.6–6.5)

## 2018-05-15 LAB — LDL CHOLESTEROL, DIRECT: Direct LDL: 123 mg/dL

## 2018-05-16 MED ORDER — ATORVASTATIN CALCIUM 10 MG PO TABS: 10.0000 mg | ORAL_TABLET | Freq: Every day | ORAL | 3 refills | Status: DC

## 2018-08-02 ENCOUNTER — Other Ambulatory Visit: Payer: Self-pay | Admitting: Family Medicine

## 2018-08-02 ENCOUNTER — Encounter: Payer: Self-pay | Admitting: Family Medicine

## 2018-08-02 MED ORDER — AMOXICILLIN 500 MG PO CAPS
500.0000 mg | ORAL_CAPSULE | Freq: Three times a day (TID) | ORAL | 0 refills | Status: DC
Start: 2018-08-02 — End: 2019-01-28

## 2018-08-12 ENCOUNTER — Ambulatory Visit: Payer: Medicare Other | Admitting: Family Medicine

## 2019-01-06 ENCOUNTER — Encounter: Payer: Self-pay | Admitting: Family Medicine

## 2019-01-06 ENCOUNTER — Other Ambulatory Visit: Payer: Self-pay | Admitting: Family Medicine

## 2019-01-07 ENCOUNTER — Ambulatory Visit: Payer: Self-pay

## 2019-01-07 NOTE — Telephone Encounter (Addendum)
Call placed to patient. Left VM to return call to office.  Forwarding to [place].    ----- Message -----  From: Gary Short.  Sent: 01/06/2019  8:51 PM EST  To: [facility] Admin Pool  Subject: Appointment scheduled from [facility] For: Gary Short. ([id number])  Visit Type: MYCHART OFFICE VISIT [phone])    01/28/2019   4:15 PM 30 mins. [name], MD    [id number]    Patient Comments:  Chest pain arm weakness...blood sugar running extremely high   Second attempt to contact pt to triage symptoms. Will route to office for follow up.

## 2019-01-13 NOTE — Telephone Encounter (Signed)
He really needs to be seen closer to home. He could go to an urgent care just tp get an EKG and make sure he is OK til hi appointment in a couple of weeks. Also he cannot double his Glimeperide he is already on the max dose. We could add Januvia 100 mg tabs, 1 tab po daily if his sugars are still up. Please check with him

## 2019-01-28 ENCOUNTER — Ambulatory Visit (INDEPENDENT_AMBULATORY_CARE_PROVIDER_SITE_OTHER): Payer: Medicare Other | Admitting: Family Medicine

## 2019-01-28 VITALS — BP 122/95 | HR 75 | Temp 97.9°F | Resp 18 | Wt 290.6 lb

## 2019-01-28 DIAGNOSIS — R030 Elevated blood-pressure reading, without diagnosis of hypertension: Secondary | ICD-10-CM

## 2019-01-28 DIAGNOSIS — Z79899 Other long term (current) drug therapy: Secondary | ICD-10-CM

## 2019-01-28 DIAGNOSIS — Z720 Tobacco use: Secondary | ICD-10-CM

## 2019-01-28 DIAGNOSIS — R079 Chest pain, unspecified: Secondary | ICD-10-CM | POA: Diagnosis not present

## 2019-01-28 DIAGNOSIS — E6609 Other obesity due to excess calories: Secondary | ICD-10-CM

## 2019-01-28 DIAGNOSIS — R0789 Other chest pain: Secondary | ICD-10-CM | POA: Diagnosis not present

## 2019-01-28 DIAGNOSIS — E669 Obesity, unspecified: Secondary | ICD-10-CM | POA: Diagnosis not present

## 2019-01-28 DIAGNOSIS — E782 Mixed hyperlipidemia: Secondary | ICD-10-CM | POA: Diagnosis not present

## 2019-01-28 DIAGNOSIS — E1169 Type 2 diabetes mellitus with other specified complication: Secondary | ICD-10-CM

## 2019-01-28 MED ORDER — GLIMEPIRIDE 4 MG PO TABS: 4.0000 mg | ORAL_TABLET | Freq: Two times a day (BID) | ORAL | 5 refills | Status: DC

## 2019-01-28 MED ORDER — INSULIN ISOPHANE HUMAN 100 UNIT/ML KWIKPEN: 10.0000 [IU] | PEN_INJECTOR | Freq: Two times a day (BID) | SUBCUTANEOUS | 5 refills | Status: DC

## 2019-01-28 MED ORDER — INSULIN PEN NEEDLE 32G X 4 MM MISC: 3 refills | Status: DC

## 2019-01-28 NOTE — Assessment & Plan Note (Signed)
Encouraged complete cessation. Discussed need to quit as relates to risk of numerous cancers, cardiac and pulmonary disease as well as neurologic complications. Counseled for greater than 3 minutes 

## 2019-01-29 LAB — CBC
HCT: 46.6 % (ref 39.0–52.0)
Hemoglobin: 15.6 g/dL (ref 13.0–17.0)
MCHC: 33.5 g/dL (ref 30.0–36.0)
MCV: 90.2 fl (ref 78.0–100.0)
PLATELETS: 218 10*3/uL (ref 150.0–400.0)
RBC: 5.16 Mil/uL (ref 4.22–5.81)
RDW: 13.7 % (ref 11.5–15.5)
WBC: 7.9 10*3/uL (ref 4.0–10.5)

## 2019-01-29 LAB — COMPREHENSIVE METABOLIC PANEL
ALT: 28 U/L (ref 0–53)
AST: 19 U/L (ref 0–37)
Albumin: 4.3 g/dL (ref 3.5–5.2)
Alkaline Phosphatase: 81 U/L (ref 39–117)
BUN: 15 mg/dL (ref 6–23)
CHLORIDE: 100 meq/L (ref 96–112)
CO2: 28 meq/L (ref 19–32)
Calcium: 9.8 mg/dL (ref 8.4–10.5)
Creatinine, Ser: 0.93 mg/dL (ref 0.40–1.50)
GFR: 86.96 mL/min (ref >60.00)
Glucose, Bld: 167 mg/dL — ABNORMAL HIGH (ref 70–99)
Potassium: 4.4 mEq/L (ref 3.5–5.1)
Sodium: 135 mEq/L (ref 135–145)
Total Bilirubin: 0.5 mg/dL (ref 0.2–1.2)
Total Protein: 6.7 g/dL (ref 6.0–8.3)

## 2019-01-29 LAB — MICROALBUMIN / CREATININE URINE RATIO
CREATININE, U: 208.3 mg/dL
Microalb Creat Ratio: 0.4 mg/g (ref 0.0–30.0)
Microalb, Ur: 0.9 mg/dL (ref 0.0–1.9)

## 2019-01-29 LAB — LIPID PANEL
Cholesterol: 178 mg/dL (ref 0–200)
HDL: 46.2 mg/dL (ref >39.00)
NonHDL: 132.26
Total CHOL/HDL Ratio: 4
Triglycerides: 213 mg/dL — ABNORMAL HIGH (ref 0.0–149.0)
VLDL: 42.6 mg/dL — ABNORMAL HIGH (ref 0.0–40.0)

## 2019-01-29 LAB — TSH: TSH: 2.33 u[IU]/mL (ref 0.35–4.50)

## 2019-01-29 LAB — HEMOGLOBIN A1C: Hgb A1c MFr Bld: 9.7 % — ABNORMAL HIGH (ref 4.6–6.5)

## 2019-01-29 LAB — LDL CHOLESTEROL, DIRECT: Direct LDL: 117 mg/dL

## 2019-01-29 NOTE — Assessment & Plan Note (Signed)
Had a bad episode of significant chest pressure and SOB last week. He is better today although he does still endorse some mild discomfort. Started on 81 mg aspirin and NTG. EKG unremarkable. Referred to cardiology for further consideraiton.

## 2019-01-29 NOTE — Progress Notes (Signed)
Subjective:    Patient ID: Gary Short., male    DOB: 21-Nov-1971, 48 y.o.   MRN: [id number]  No chief complaint on file.   HPI Patient is in today for follow up accompanied by his wife. They endorse an episode of chest pain last week. He did have sob with it and it was pressure. It awoke him from sleep. Since that initial day it is better but has continued to recurred. Denies palp/HA/congestion/fevers/GI or GU c/o. Taking meds as prescribed  Past Medical History:  Diagnosis Date  . Atypical chest pain 05/08/2017  . Back pain 06/13/2010   Qualifier: Diagnosis of  By: Charlett Blake MD, Sonia Baller back at work in 2002, was able to work until 2004. Ultimately had  Surgeries to his lower back twice and after complications with a spinal leak, was never able to return to work Radicular symptoms occur b/l.   Marland Kitchen Knee pain, bilateral 08/26/2014  . Medicare annual wellness visit, subsequent 05/07/2016  . Obesity, unspecified 06/13/2010   Qualifier: Diagnosis of  By: Charlett Blake MD, Erline Levine      Past Surgical History:  Procedure Laterality Date  . BACK SURGERY     X 2  . KNEE SURGERY  1999   cartilage repair    Family History  Problem Relation Age of Onset  . Arthritis Other        family hx of  . Diabetes Other        family hx of  . Hypertension Other        family hx of  . Stroke Other        family hx of  . Sudden death Other        family hx of  . Stroke Father   . Diabetes Father   . Hyperlipidemia Father   . Hypertension Father   . Cancer Mother        breast  . Diabetes Mother   . Hyperlipidemia Mother   . Hypertension Mother   . Diabetes Sister   . Arthritis Sister   . Cancer Maternal Grandmother   . Diabetes Sister     Social History   Socioeconomic History  . Marital status: Married    Spouse name: Not on file  . Number of children: Not on file  . Years of education: Not on file  . Highest education level: Not on file  Occupational History  . Not on file  Social  Needs  . Financial resource strain: Not on file  . Food insecurity:    Worry: Not on file    Inability: Not on file  . Transportation needs:    Medical: Not on file    Non-medical: Not on file  Tobacco Use  . Smoking status: Current Every Day Smoker    Packs/day: 1.00    Types: Cigarettes  . Smokeless tobacco: Never Used  Substance and Sexual Activity  . Alcohol use: No  . Drug use: No  . Sexual activity: Not on file  Lifestyle  . Physical activity:    Days per week: Not on file    Minutes per session: Not on file  . Stress: Not on file  Relationships  . Social connections:    Talks on phone: Not on file    Gets together: Not on file    Attends religious service: Not on file    Active member of club or organization: Not on file    Attends meetings of clubs  or organizations: Not on file    Relationship status: Not on file  . Intimate partner violence:    Fear of current or ex partner: Not on file    Emotionally abused: Not on file    Physically abused: Not on file    Forced sexual activity: Not on file  Other Topics Concern  . Not on file  Social History Narrative  . Not on file    Outpatient Medications Prior to Visit  Medication Sig Dispense Refill  . aspirin EC 81 MG tablet Take 1 tablet (81 mg total) by mouth daily.    Marland Kitchen atorvastatin (LIPITOR) 10 MG tablet Take 1 tablet (10 mg total) by mouth at bedtime. 30 tablet 3  . glucose blood (BAYER CONTOUR TEST) test strip Check blood glucose once daily and as needed. Dx: E11.9 100 each 5  . HYDROcodone-ibuprofen (VICOPROFEN) 7.5-200 MG tablet Take 1 tablet by mouth every 8 (eight) hours as needed for moderate pain. 90 tablet 0  . MICROLET LANCETS MISC Check blood sugar DX E11.9 100 each 5  . nitroGLYCERIN (NITROSTAT) 0.4 MG SL tablet Place 1 tablet (0.4 mg total) under the tongue every 5 (five) minutes as needed for chest pain. 25 tablet 1  . tiZANidine (ZANAFLEX) 4 MG tablet Take 1 tablet (4 mg total) by mouth every 6  (six) hours as needed for muscle spasms. 90 tablet 5  . amoxicillin (AMOXIL) 500 MG capsule Take 1 capsule (500 mg total) by mouth 3 (three) times daily. 21 capsule 0  . glimepiride (AMARYL) 4 MG tablet TAKE 1 TABLET BY MOUTH TWICE DAILY 60 tablet 0   No facility-administered medications prior to visit.     Allergies  Allergen Reactions  . Iodides     Review of Systems  Constitutional: Positive for malaise/fatigue. Negative for fever.  HENT: Negative for congestion.   Eyes: Negative for blurred vision.  Respiratory: Negative for shortness of breath.   Cardiovascular: Negative for chest pain, palpitations and leg swelling.  Gastrointestinal: Negative for abdominal pain, blood in stool and nausea.  Genitourinary: Negative for dysuria and frequency.  Musculoskeletal: Positive for back pain and joint pain. Negative for falls.  Skin: Negative for rash.  Neurological: Negative for dizziness, loss of consciousness and headaches.  Endo/Heme/Allergies: Negative for environmental allergies.  Psychiatric/Behavioral: Negative for depression. The patient is not nervous/anxious.        Objective:    Physical Exam Vitals signs and nursing note reviewed.  Constitutional:      General: He is not in acute distress.    Appearance: He is well-developed.  HENT:     Head: Normocephalic and atraumatic.     Nose: Nose normal.  Eyes:     General:        Right eye: No discharge.        Left eye: No discharge.  Neck:     Musculoskeletal: Normal range of motion and neck supple.  Cardiovascular:     Rate and Rhythm: Normal rate and regular rhythm.     Heart sounds: No murmur.  Pulmonary:     Effort: Pulmonary effort is normal.     Breath sounds: Normal breath sounds.  Abdominal:     General: Bowel sounds are normal.     Palpations: Abdomen is soft.     Tenderness: There is no abdominal tenderness.  Skin:    General: Skin is warm and dry.  Neurological:     Mental Status: He is alert and  oriented  to person, place, and time.     BP (!) 122/95 (BP Location: Left Arm, Patient Position: Sitting, Cuff Size: Normal)   Pulse 75   Temp 97.9 F (36.6 C) (Oral)   Resp 18   Wt 290 lb 9.6 oz (131.8 kg)   SpO2 100%   BMI 37.31 kg/m  Wt Readings from Last 3 Encounters:  01/28/19 290 lb 9.6 oz (131.8 kg)  05/14/18 283 lb (128.4 kg)  02/04/18 280 lb 6.4 oz (127.2 kg)     Lab Results  Component Value Date   WBC 7.9 01/28/2019   HGB 15.6 01/28/2019   HCT 46.6 01/28/2019   PLT 218.0 01/28/2019   GLUCOSE 167 (H) 01/28/2019   CHOL 178 01/28/2019   TRIG 213.0 (H) 01/28/2019   HDL 46.20 01/28/2019   LDLDIRECT 117.0 01/28/2019   LDLCALC 106 (H) 05/08/2017   ALT 28 01/28/2019   AST 19 01/28/2019   NA 135 01/28/2019   K 4.4 01/28/2019   CL 100 01/28/2019   CREATININE 0.93 01/28/2019   BUN 15 01/28/2019   CO2 28 01/28/2019   TSH 2.33 01/28/2019   HGBA1C 9.7 (H) 01/28/2019   MICROALBUR 0.9 01/28/2019    Lab Results  Component Value Date   TSH 2.33 01/28/2019   Lab Results  Component Value Date   WBC 7.9 01/28/2019   HGB 15.6 01/28/2019   HCT 46.6 01/28/2019   MCV 90.2 01/28/2019   PLT 218.0 01/28/2019   Lab Results  Component Value Date   NA 135 01/28/2019   K 4.4 01/28/2019   CO2 28 01/28/2019   GLUCOSE 167 (H) 01/28/2019   BUN 15 01/28/2019   CREATININE 0.93 01/28/2019   BILITOT 0.5 01/28/2019   ALKPHOS 81 01/28/2019   AST 19 01/28/2019   ALT 28 01/28/2019   PROT 6.7 01/28/2019   ALBUMIN 4.3 01/28/2019   CALCIUM 9.8 01/28/2019   GFR 86.96 01/28/2019   Lab Results  Component Value Date   CHOL 178 01/28/2019   Lab Results  Component Value Date   HDL 46.20 01/28/2019   Lab Results  Component Value Date   LDLCALC 106 (H) 05/08/2017   Lab Results  Component Value Date   TRIG 213.0 (H) 01/28/2019   Lab Results  Component Value Date   CHOLHDL 4 01/28/2019   Lab Results  Component Value Date   HGBA1C 9.7 (H) 01/28/2019         Assessment & Plan:   Problem List Items Addressed This Visit    MIXED HYPERLIPIDEMIA    Tolerating statin, encouraged heart healthy diet, avoid trans fats, minimize simple carbs and saturated fats. Increase exercise as tolerated      Relevant Orders   Lipid panel (Completed)   Obesity    Encouraged DASH diet, decrease po intake and increase exercise as tolerated. Needs 7-8 hours of sleep nightly. Avoid trans fats, eat small, frequent meals every 4-5 hours with lean proteins, complex carbs and healthy fats. Minimize simple carbs      Relevant Medications   Insulin NPH, Human,, Isophane, (NOVOLIN N FLEXPEN RELION) 100 UNIT/ML Kiwkpen   glimepiride (AMARYL) 4 MG tablet   ELEVATED BLOOD PRESSURE WITHOUT DIAGNOSIS OF HYPERTENSION   Relevant Orders   CBC (Completed)   Comprehensive metabolic panel (Completed)   TSH (Completed)   Diabetes mellitus type 2 in obese (HCC)    hgba1c increased, minimize simple carbs. Increase exercise as tolerated. Continue current meds unfortunately he has been taking too many GLimeperide and  now agrees to just take two a day. Start on Relion N 10 units bid and referred to endocrinology for further consideration. Minimize carbohydrates      Relevant Medications   Insulin NPH, Human,, Isophane, (NOVOLIN N FLEXPEN RELION) 100 UNIT/ML Kiwkpen   glimepiride (AMARYL) 4 MG tablet   Other Relevant Orders   Ambulatory referral to Endocrinology   Hemoglobin A1c (Completed)   Microalbumin / creatinine urine ratio (Completed)   TSH (Completed)   Tobacco abuse    Encouraged complete cessation. Discussed need to quit as relates to risk of numerous cancers, cardiac and pulmonary disease as well as neurologic complications. Counseled for greater than 3 minutes       Other Visit Diagnoses    Chest pain, unspecified type    -  Primary   Relevant Orders   EKG 12-Lead (Completed)   Ambulatory referral to Cardiology   High risk medication use       Relevant Orders    Pain Mgmt, Profile 8 w/Conf, U      I have discontinued Gary Franco Jr.'s amoxicillin. I have also changed his glimepiride. Additionally, I am having him start on Insulin NPH (Human) (Isophane) and Insulin Pen Needle. Lastly, I am having him maintain his glucose blood, aspirin EC, MICROLET LANCETS, tiZANidine, nitroGLYCERIN, HYDROcodone-ibuprofen, and atorvastatin.  Meds ordered this encounter  Medications  . Insulin NPH, Human,, Isophane, (NOVOLIN N FLEXPEN RELION) 100 UNIT/ML Kiwkpen    Sig: Inject 10 Units into the skin 2 (two) times daily.    Dispense:  15 mL    Refill:  5  . Insulin Pen Needle (RELION PEN NEEDLES) 32G X 4 MM MISC    Sig: 1 sq TWICE A DAY    Dispense:  60 each    Refill:  3  . glimepiride (AMARYL) 4 MG tablet    Sig: Take 1 tablet (4 mg total) by mouth 2 (two) times daily.    Dispense:  60 tablet    Refill:  5     Penni Homans, MD

## 2019-01-29 NOTE — Assessment & Plan Note (Signed)
Encouraged DASH diet, decrease po intake and increase exercise as tolerated. Needs 7-8 hours of sleep nightly. Avoid trans fats, eat small, frequent meals every 4-5 hours with lean proteins, complex carbs and healthy fats. Minimize simple carbs 

## 2019-01-29 NOTE — Assessment & Plan Note (Signed)
hgba1c increased, minimize simple carbs. Increase exercise as tolerated. Continue current meds unfortunately he has been taking too many GLimeperide and now agrees to just take two a day. Start on Relion N 10 units bid and referred to endocrinology for further consideration. Minimize carbohydrates

## 2019-01-29 NOTE — Assessment & Plan Note (Signed)
Tolerating statin, encouraged heart healthy diet, avoid trans fats, minimize simple carbs and saturated fats. Increase exercise as tolerated 

## 2019-02-01 LAB — PAIN MGMT, PROFILE 8 W/CONF, U
6 ACETYLMORPHINE: NEGATIVE ng/mL (ref <10)
AMPHETAMINES: NEGATIVE ng/mL (ref <500)
Alcohol Metabolites: NEGATIVE ng/mL (ref <500)
BENZODIAZEPINES: NEGATIVE ng/mL (ref <100)
BUPRENORPHINE, URINE: NEGATIVE ng/mL (ref <5)
CODEINE: NEGATIVE ng/mL (ref <50)
Cocaine Metabolite: NEGATIVE ng/mL (ref <150)
Creatinine: 207.6 mg/dL
HYDROCODONE: 272 ng/mL — AB (ref <50)
HYDROMORPHONE: 129 ng/mL — AB (ref <50)
MARIJUANA METABOLITE: 25 ng/mL — AB (ref <5)
MARIJUANA METABOLITE: POSITIVE ng/mL — AB (ref <20)
MDMA: NEGATIVE ng/mL (ref <500)
Morphine: NEGATIVE ng/mL (ref <50)
Norhydrocodone: 103 ng/mL — ABNORMAL HIGH (ref <50)
Opiates: POSITIVE ng/mL — AB (ref <100)
Oxidant: NEGATIVE ug/mL (ref <200)
Oxycodone: NEGATIVE ng/mL (ref <100)
pH: 5.7 (ref 4.5–9.0)

## 2019-02-03 ENCOUNTER — Encounter: Payer: Self-pay | Admitting: Family Medicine

## 2019-02-05 ENCOUNTER — Encounter: Payer: Self-pay | Admitting: Family Medicine

## 2019-02-07 ENCOUNTER — Encounter: Payer: Self-pay | Admitting: Family Medicine

## 2019-03-04 DIAGNOSIS — E785 Hyperlipidemia, unspecified: Secondary | ICD-10-CM | POA: Diagnosis not present

## 2019-03-04 DIAGNOSIS — E119 Type 2 diabetes mellitus without complications: Secondary | ICD-10-CM | POA: Diagnosis not present

## 2019-03-04 DIAGNOSIS — R079 Chest pain, unspecified: Secondary | ICD-10-CM | POA: Diagnosis not present

## 2019-03-04 DIAGNOSIS — I10 Essential (primary) hypertension: Secondary | ICD-10-CM | POA: Diagnosis not present

## 2019-03-04 DIAGNOSIS — R0609 Other forms of dyspnea: Secondary | ICD-10-CM | POA: Diagnosis not present

## 2019-06-03 ENCOUNTER — Ambulatory Visit: Payer: Medicare Other | Admitting: Family Medicine

## 2019-07-22 ENCOUNTER — Other Ambulatory Visit: Payer: Self-pay

## 2019-07-22 ENCOUNTER — Ambulatory Visit (INDEPENDENT_AMBULATORY_CARE_PROVIDER_SITE_OTHER): Payer: Medicare Other | Admitting: Family Medicine

## 2019-07-22 DIAGNOSIS — R030 Elevated blood-pressure reading, without diagnosis of hypertension: Secondary | ICD-10-CM

## 2019-07-22 DIAGNOSIS — M549 Dorsalgia, unspecified: Secondary | ICD-10-CM | POA: Diagnosis not present

## 2019-07-22 DIAGNOSIS — E782 Mixed hyperlipidemia: Secondary | ICD-10-CM

## 2019-07-22 DIAGNOSIS — E1169 Type 2 diabetes mellitus with other specified complication: Secondary | ICD-10-CM

## 2019-07-22 DIAGNOSIS — E669 Obesity, unspecified: Secondary | ICD-10-CM | POA: Diagnosis not present

## 2019-07-22 DIAGNOSIS — R3911 Hesitancy of micturition: Secondary | ICD-10-CM | POA: Diagnosis not present

## 2019-07-22 DIAGNOSIS — G43009 Migraine without aura, not intractable, without status migrainosus: Secondary | ICD-10-CM

## 2019-07-22 MED ORDER — CIPROFLOXACIN HCL 500 MG PO TABS: 500.0000 mg | ORAL_TABLET | Freq: Two times a day (BID) | ORAL | 0 refills | Status: DC

## 2019-07-23 ENCOUNTER — Telehealth: Payer: Self-pay | Admitting: Family Medicine

## 2019-07-23 NOTE — Telephone Encounter (Signed)
LM to schedule 3 wk f/u

## 2019-07-27 DIAGNOSIS — R3911 Hesitancy of micturition: Secondary | ICD-10-CM | POA: Insufficient documentation

## 2019-07-27 NOTE — Assessment & Plan Note (Signed)
Tolerating statin, encouraged heart healthy diet, avoid trans fats, minimize simple carbs and saturated fats. Increase exercise as tolerated 

## 2019-07-27 NOTE — Assessment & Plan Note (Signed)
Encouraged increased hydration, 64 ounces of clear fluids daily. Minimize alcohol and caffeine. Eat small frequent meals with lean proteins and complex carbs. Avoid high and low blood sugars. Get adequate sleep, 7-8 hours a night. Needs exercise daily preferably in the morning.  

## 2019-07-27 NOTE — Assessment & Plan Note (Signed)
hgba1c acceptable, minimize simple carbs. Increase exercise as tolerated. Continue current meds 

## 2019-07-27 NOTE — Assessment & Plan Note (Signed)
Consider prostatitis started on ciprofloxacin

## 2019-07-27 NOTE — Assessment & Plan Note (Signed)
Continues to struggle with daily pain but is still functional with current meds.

## 2019-07-27 NOTE — Progress Notes (Signed)
Virtual Visit via Video Note  I connected with Gary Short. on 07/22/19 at  3:00 PM EDT by a video enabled telemedicine application and verified that I am speaking with the correct person using two identifiers.  Location: Patient: home Provider: office   I discussed the limitations of evaluation and management by telemedicine and the availability of in person appointments. The patient expressed understanding and agreed to proceed. Magdalene Molly, CMA was able to get patient set up on visit, video   Subjective:    Patient ID: Gary Short., male    DOB: 1971/09/14, 48 y.o.   MRN: [id number]  No chief complaint on file.   HPI Patient is in today for follow up on chronic medical concerns including back pain, diabetes, hyperlipidemia and more. He is having daily back pain but it is stable and he is still functional with current meds. His concern today is urinary hesitancy which has worsened over the past few weeks. No dysuria or hematuria. Denies CP/palp/SOB/HA/congestion/fevers/GI c/o. Taking meds as prescribed  Past Medical History:  Diagnosis Date  . Atypical chest pain 05/08/2017  . Back pain 06/13/2010   Qualifier: Diagnosis of  By: Charlett Blake MD, Sonia Baller back at work in 2002, was able to work until 2004. Ultimately had  Surgeries to his lower back twice and after complications with a spinal leak, was never able to return to work Radicular symptoms occur b/l.   Marland Kitchen Knee pain, bilateral 08/26/2014  . Medicare annual wellness visit, subsequent 05/07/2016  . Obesity, unspecified 06/13/2010   Qualifier: Diagnosis of  By: Charlett Blake MD, Erline Levine      Past Surgical History:  Procedure Laterality Date  . BACK SURGERY     X 2  . KNEE SURGERY  1999   cartilage repair    Family History  Problem Relation Age of Onset  . Arthritis Other        family hx of  . Diabetes Other        family hx of  . Hypertension Other        family hx of  . Stroke Other        family hx of  . Sudden  death Other        family hx of  . Stroke Father   . Diabetes Father   . Hyperlipidemia Father   . Hypertension Father   . Cancer Mother        breast  . Diabetes Mother   . Hyperlipidemia Mother   . Hypertension Mother   . Diabetes Sister   . Arthritis Sister   . Cancer Maternal Grandmother   . Diabetes Sister     Social History   Socioeconomic History  . Marital status: Married    Spouse name: Not on file  . Number of children: Not on file  . Years of education: Not on file  . Highest education level: Not on file  Occupational History  . Not on file  Social Needs  . Financial resource strain: Not on file  . Food insecurity    Worry: Not on file    Inability: Not on file  . Transportation needs    Medical: Not on file    Non-medical: Not on file  Tobacco Use  . Smoking status: Current Every Day Smoker    Packs/day: 1.00    Types: Cigarettes  . Smokeless tobacco: Never Used  Substance and Sexual Activity  . Alcohol use: No  .  Drug use: No  . Sexual activity: Not on file  Lifestyle  . Physical activity    Days per week: Not on file    Minutes per session: Not on file  . Stress: Not on file  Relationships  . Social Herbalist on phone: Not on file    Gets together: Not on file    Attends religious service: Not on file    Active member of club or organization: Not on file    Attends meetings of clubs or organizations: Not on file    Relationship status: Not on file  . Intimate partner violence    Fear of current or ex partner: Not on file    Emotionally abused: Not on file    Physically abused: Not on file    Forced sexual activity: Not on file  Other Topics Concern  . Not on file  Social History Narrative  . Not on file    Outpatient Medications Prior to Visit  Medication Sig Dispense Refill  . aspirin EC 81 MG tablet Take 1 tablet (81 mg total) by mouth daily.    Marland Kitchen atorvastatin (LIPITOR) 10 MG tablet Take 1 tablet (10 mg total) by mouth  at bedtime. 30 tablet 3  . glimepiride (AMARYL) 4 MG tablet Take 1 tablet (4 mg total) by mouth 2 (two) times daily. 60 tablet 5  . glucose blood (BAYER CONTOUR TEST) test strip Check blood glucose once daily and as needed. Dx: E11.9 100 each 5  . HYDROcodone-ibuprofen (VICOPROFEN) 7.5-200 MG tablet Take 1 tablet by mouth every 8 (eight) hours as needed for moderate pain. 90 tablet 0  . Insulin NPH, Human,, Isophane, (NOVOLIN N FLEXPEN RELION) 100 UNIT/ML Kiwkpen Inject 10 Units into the skin 2 (two) times daily. 15 mL 5  . Insulin Pen Needle (RELION PEN NEEDLES) 32G X 4 MM MISC 1 sq TWICE A DAY 60 each 3  . MICROLET LANCETS MISC Check blood sugar DX E11.9 100 each 5  . nitroGLYCERIN (NITROSTAT) 0.4 MG SL tablet Place 1 tablet (0.4 mg total) under the tongue every 5 (five) minutes as needed for chest pain. 25 tablet 1  . tiZANidine (ZANAFLEX) 4 MG tablet Take 1 tablet (4 mg total) by mouth every 6 (six) hours as needed for muscle spasms. 90 tablet 5   No facility-administered medications prior to visit.     Allergies  Allergen Reactions  . Iodides     Review of Systems  Constitutional: Negative for fever and malaise/fatigue.  HENT: Negative for congestion.   Eyes: Negative for blurred vision.  Respiratory: Negative for shortness of breath.   Cardiovascular: Negative for chest pain, palpitations and leg swelling.  Gastrointestinal: Negative for abdominal pain, blood in stool and nausea.  Genitourinary: Positive for urgency. Negative for dysuria, flank pain, frequency and hematuria.  Musculoskeletal: Positive for back pain. Negative for falls.  Skin: Negative for rash.  Neurological: Negative for dizziness, loss of consciousness and headaches.  Endo/Heme/Allergies: Negative for environmental allergies.  Psychiatric/Behavioral: Negative for depression. The patient is not nervous/anxious.        Objective:    Physical Exam Constitutional:      Appearance: Normal appearance. He is  not ill-appearing.  HENT:     Head: Normocephalic and atraumatic.     Nose: Nose normal.  Eyes:     General:        Right eye: No discharge.        Left eye: No discharge.  Pulmonary:     Effort: Pulmonary effort is normal.  Neurological:     Mental Status: He is alert and oriented to person, place, and time.  Psychiatric:        Mood and Affect: Mood normal.        Behavior: Behavior normal.     There were no vitals taken for this visit. Wt Readings from Last 3 Encounters:  01/28/19 290 lb 9.6 oz (131.8 kg)  05/14/18 283 lb (128.4 kg)  02/04/18 280 lb 6.4 oz (127.2 kg)    Diabetic Foot Exam - Simple   No data filed     Lab Results  Component Value Date   WBC 7.9 01/28/2019   HGB 15.6 01/28/2019   HCT 46.6 01/28/2019   PLT 218.0 01/28/2019   GLUCOSE 167 (H) 01/28/2019   CHOL 178 01/28/2019   TRIG 213.0 (H) 01/28/2019   HDL 46.20 01/28/2019   LDLDIRECT 117.0 01/28/2019   LDLCALC 106 (H) 05/08/2017   ALT 28 01/28/2019   AST 19 01/28/2019   NA 135 01/28/2019   K 4.4 01/28/2019   CL 100 01/28/2019   CREATININE 0.93 01/28/2019   BUN 15 01/28/2019   CO2 28 01/28/2019   TSH 2.33 01/28/2019   HGBA1C 9.7 (H) 01/28/2019   MICROALBUR 0.9 01/28/2019    Lab Results  Component Value Date   TSH 2.33 01/28/2019   Lab Results  Component Value Date   WBC 7.9 01/28/2019   HGB 15.6 01/28/2019   HCT 46.6 01/28/2019   MCV 90.2 01/28/2019   PLT 218.0 01/28/2019   Lab Results  Component Value Date   NA 135 01/28/2019   K 4.4 01/28/2019   CO2 28 01/28/2019   GLUCOSE 167 (H) 01/28/2019   BUN 15 01/28/2019   CREATININE 0.93 01/28/2019   BILITOT 0.5 01/28/2019   ALKPHOS 81 01/28/2019   AST 19 01/28/2019   ALT 28 01/28/2019   PROT 6.7 01/28/2019   ALBUMIN 4.3 01/28/2019   CALCIUM 9.8 01/28/2019   GFR 86.96 01/28/2019   Lab Results  Component Value Date   CHOL 178 01/28/2019   Lab Results  Component Value Date   HDL 46.20 01/28/2019   Lab Results   Component Value Date   LDLCALC 106 (H) 05/08/2017   Lab Results  Component Value Date   TRIG 213.0 (H) 01/28/2019   Lab Results  Component Value Date   CHOLHDL 4 01/28/2019   Lab Results  Component Value Date   HGBA1C 9.7 (H) 01/28/2019       Assessment & Plan:   Problem List Items Addressed This Visit    MIXED HYPERLIPIDEMIA - Primary    Tolerating statin, encouraged heart healthy diet, avoid trans fats, minimize simple carbs and saturated fats. Increase exercise as tolerated      Relevant Orders   Lipid panel   Migraine without aura    Encouraged increased hydration, 64 ounces of clear fluids daily. Minimize alcohol and caffeine. Eat small frequent meals with lean proteins and complex carbs. Avoid high and low blood sugars. Get adequate sleep, 7-8 hours a night. Needs exercise daily preferably in the morning.      Back pain    Continues to struggle with daily pain but is still functional with current meds.       ELEVATED BLOOD PRESSURE WITHOUT DIAGNOSIS OF HYPERTENSION   Relevant Orders   CBC   Comprehensive metabolic panel   TSH   Diabetes mellitus type 2 in obese ([place])  hgba1c acceptable, minimize simple carbs. Increase exercise as tolerated. Continue current meds      Relevant Orders   Hemoglobin A1c   TSH   Urinary hesitancy    Consider prostatitis started on ciprofloxacin         I am having Gary Short. start on ciprofloxacin. I am also having him maintain his glucose blood, aspirin EC, Microlet Lancets, tiZANidine, nitroGLYCERIN, HYDROcodone-ibuprofen, atorvastatin, Insulin NPH (Human) (Isophane), Insulin Pen Needle, and glimepiride.  Meds ordered this encounter  Medications  . ciprofloxacin (CIPRO) 500 MG tablet    Sig: Take 1 tablet (500 mg total) by mouth 2 (two) times daily.    Dispense:  20 tablet    Refill:  0     I discussed the assessment and treatment plan with the patient. The patient was provided an opportunity to ask  questions and all were answered. The patient agreed with the plan and demonstrated an understanding of the instructions.   The patient was advised to call back or seek an in-person evaluation if the symptoms worsen or if the condition fails to improve as anticipated.  I provided 25 minutes of non-face-to-face time during this encounter.   Penni Homans, MD

## 2019-10-08 ENCOUNTER — Telehealth: Payer: Self-pay | Admitting: *Deleted

## 2019-10-08 MED ORDER — "INSULIN SYRINGE-NEEDLE U-100 30G X 5/16"" 0.3 ML MISC": 5 refills | Status: DC

## 2019-10-08 MED ORDER — INSULIN NPH (HUMAN) (ISOPHANE) 100 UNIT/ML ~~LOC~~ SUSP: 10.0000 [IU] | Freq: Two times a day (BID) | SUBCUTANEOUS | 5 refills | Status: DC

## 2019-10-08 NOTE — Telephone Encounter (Signed)
Notified that medications have been sent in.

## 2019-10-08 NOTE — Telephone Encounter (Signed)
The price of freds medicine (that pen for his diabetes) went up too high for [place] to get...the pharmacist said if you would send in a script for the regular bottle of it and he can get regular needles if would be much cheaper...is that possible for you to do? Pharmacy is [facility] in [place]

## 2019-10-09 ENCOUNTER — Encounter: Payer: Self-pay | Admitting: Family Medicine

## 2019-11-12 ENCOUNTER — Encounter: Payer: Self-pay | Admitting: Family Medicine

## 2019-11-13 ENCOUNTER — Other Ambulatory Visit: Payer: Self-pay

## 2019-11-13 MED ORDER — RELION PEN NEEDLES 32G X 4 MM MISC: 99 refills | Status: DC

## 2020-01-22 ENCOUNTER — Encounter: Payer: Self-pay | Admitting: Family Medicine

## 2020-01-27 NOTE — Telephone Encounter (Signed)
No appt available with Dr. Charlett Blake. LM for pt to call back to schedule virtual with another provider

## 2020-03-12 ENCOUNTER — Other Ambulatory Visit: Payer: Self-pay | Admitting: Family Medicine

## 2020-06-09 ENCOUNTER — Other Ambulatory Visit: Payer: Self-pay | Admitting: Family Medicine

## 2020-06-09 DIAGNOSIS — R350 Frequency of micturition: Secondary | ICD-10-CM | POA: Diagnosis not present

## 2020-06-09 DIAGNOSIS — E669 Obesity, unspecified: Secondary | ICD-10-CM | POA: Diagnosis not present

## 2020-06-09 DIAGNOSIS — Z794 Long term (current) use of insulin: Secondary | ICD-10-CM | POA: Diagnosis not present

## 2020-06-09 DIAGNOSIS — E1169 Type 2 diabetes mellitus with other specified complication: Secondary | ICD-10-CM | POA: Diagnosis not present

## 2020-06-09 DIAGNOSIS — F17200 Nicotine dependence, unspecified, uncomplicated: Secondary | ICD-10-CM | POA: Diagnosis not present

## 2020-06-09 DIAGNOSIS — R0781 Pleurodynia: Secondary | ICD-10-CM | POA: Diagnosis not present

## 2020-06-09 DIAGNOSIS — E538 Deficiency of other specified B group vitamins: Secondary | ICD-10-CM | POA: Diagnosis not present

## 2020-06-09 NOTE — Telephone Encounter (Signed)
Patient called about refill for medication and cost.  Looks like they have not filled anything and new rx sent in.  Patient wife notified.

## 2020-06-11 ENCOUNTER — Other Ambulatory Visit: Payer: Self-pay | Admitting: *Deleted

## 2020-06-11 NOTE — Addendum Note (Signed)
Addended by: Kem Boroughs D on: 06/11/2020 02:45 PM   Modules accepted: Orders

## 2020-06-21 DIAGNOSIS — R030 Elevated blood-pressure reading, without diagnosis of hypertension: Secondary | ICD-10-CM | POA: Diagnosis not present

## 2020-06-21 DIAGNOSIS — E669 Obesity, unspecified: Secondary | ICD-10-CM | POA: Diagnosis not present

## 2020-06-21 DIAGNOSIS — E1169 Type 2 diabetes mellitus with other specified complication: Secondary | ICD-10-CM | POA: Diagnosis not present

## 2020-06-21 DIAGNOSIS — E782 Mixed hyperlipidemia: Secondary | ICD-10-CM | POA: Diagnosis not present

## 2020-06-22 LAB — COMPREHENSIVE METABOLIC PANEL
ALT: 19 IU/L (ref 0–44)
AST: 15 IU/L (ref 0–40)
Albumin/Globulin Ratio: 1.7 (ref 1.2–2.2)
Albumin: 3.9 g/dL — ABNORMAL LOW (ref 4.0–5.0)
Alkaline Phosphatase: 91 IU/L (ref 48–121)
BUN/Creatinine Ratio: 9 (ref 9–20)
BUN: 9 mg/dL (ref 6–24)
Bilirubin Total: 0.4 mg/dL (ref 0.0–1.2)
CO2: 23 mmol/L (ref 20–29)
Calcium: 9.2 mg/dL (ref 8.7–10.2)
Chloride: 102 mmol/L (ref 96–106)
Creatinine, Ser: 0.99 mg/dL (ref 0.76–1.27)
GFR calc Af Amer: 104 mL/min/{1.73_m2} (ref >59)
GFR calc non Af Amer: 90 mL/min/{1.73_m2} (ref >59)
Globulin, Total: 2.3 g/dL (ref 1.5–4.5)
Glucose: 152 mg/dL — ABNORMAL HIGH (ref 65–99)
Potassium: 4.3 mmol/L (ref 3.5–5.2)
Sodium: 140 mmol/L (ref 134–144)
Total Protein: 6.2 g/dL (ref 6.0–8.5)

## 2020-06-22 LAB — HEMOGLOBIN A1C
Est. average glucose Bld gHb Est-mCnc: 174 mg/dL
Hgb A1c MFr Bld: 7.7 % — ABNORMAL HIGH (ref 4.8–5.6)

## 2020-06-22 LAB — CBC
Hematocrit: 47.1 % (ref 37.5–51.0)
Hemoglobin: 15.6 g/dL (ref 13.0–17.7)
MCH: 30.1 pg (ref 26.6–33.0)
MCHC: 33.1 g/dL (ref 31.5–35.7)
MCV: 91 fL (ref 79–97)
Platelets: 237 10*3/uL (ref 150–450)
RBC: 5.19 x10E6/uL (ref 4.14–5.80)
RDW: 12.5 % (ref 11.6–15.4)
WBC: 6.1 10*3/uL (ref 3.4–10.8)

## 2020-06-22 LAB — LIPID PANEL
Chol/HDL Ratio: 3.9 ratio (ref 0.0–5.0)
Cholesterol, Total: 150 mg/dL (ref 100–199)
HDL: 38 mg/dL — ABNORMAL LOW (ref >39)
LDL Chol Calc (NIH): 87 mg/dL (ref 0–99)
Triglycerides: 143 mg/dL (ref 0–149)
VLDL Cholesterol Cal: 25 mg/dL (ref 5–40)

## 2020-06-22 LAB — TSH: TSH: 1.49 u[IU]/mL (ref 0.450–4.500)

## 2020-06-25 ENCOUNTER — Telehealth (INDEPENDENT_AMBULATORY_CARE_PROVIDER_SITE_OTHER): Payer: Medicare Other | Admitting: Family Medicine

## 2020-06-25 ENCOUNTER — Other Ambulatory Visit: Payer: Self-pay

## 2020-06-25 ENCOUNTER — Telehealth: Payer: Self-pay | Admitting: *Deleted

## 2020-06-25 DIAGNOSIS — E669 Obesity, unspecified: Secondary | ICD-10-CM

## 2020-06-25 DIAGNOSIS — M549 Dorsalgia, unspecified: Secondary | ICD-10-CM | POA: Diagnosis not present

## 2020-06-25 DIAGNOSIS — E1169 Type 2 diabetes mellitus with other specified complication: Secondary | ICD-10-CM | POA: Diagnosis not present

## 2020-06-25 DIAGNOSIS — E782 Mixed hyperlipidemia: Secondary | ICD-10-CM

## 2020-06-25 DIAGNOSIS — F418 Other specified anxiety disorders: Secondary | ICD-10-CM | POA: Diagnosis not present

## 2020-06-25 MED ORDER — SILDENAFIL CITRATE 20 MG PO TABS: 20.0000 mg | ORAL_TABLET | Freq: Two times a day (BID) | ORAL | 1 refills | Status: DC | PRN

## 2020-06-25 MED ORDER — ESCITALOPRAM OXALATE 10 MG PO TABS: 10.0000 mg | ORAL_TABLET | Freq: Every day | ORAL | 3 refills | Status: DC

## 2020-06-25 NOTE — Assessment & Plan Note (Signed)
hgba1c has improved from 9.7 to 7.7 but he is only eating one meal a day and when his sugar drops to 80 he feels weak and shaky. He is encouraged to eat a protein filled meal tid and he may drop his Insulin to 8 units bid and monitor his sugars.

## 2020-06-25 NOTE — Assessment & Plan Note (Signed)
His father died a month ago and he notes he is more depressed and eating less. He was already loosing weight since he lost his sense of taste and smell last year and he reports his appetite is low. Will start him on Lexapro 10 mg po daily and reassess next month.

## 2020-06-25 NOTE — Assessment & Plan Note (Signed)
Encouraged heart healthy diet, increase exercise, avoid trans fats, consider a krill oil cap daily 

## 2020-06-25 NOTE — Telephone Encounter (Signed)
Need to follow up in 6-8 weeks.  Wife Apolonio Schneiders needs to be schedule as well and has phone note to.

## 2020-06-25 NOTE — Assessment & Plan Note (Signed)
He had a fall last month which flared his back pain, he was incapacitated for a couple of weeks but it has improved at this time. He declines referral for further evaluation at this time. Encouraged moist heat and gentle stretching as tolerated. May try NSAIDs and prescription meds as directed and report if symptoms worsen or seek immediate care

## 2020-06-25 NOTE — Progress Notes (Signed)
Virtual Visit via Video Note  I connected with Gary Short. on 06/25/20 at  8:00 AM EDT by a video enabled telemedicine application and verified that I am speaking with the correct person using two identifiers.  Location: Patient: home, patient and provider are in visit Provider: home   I discussed the limitations of evaluation and management by telemedicine and the availability of in person appointments. The patient expressed understanding and agreed to proceed. Kem Boroughs, CMA was able to get the patient set up on a video visit    Subjective:    Patient ID: Gary Short., male    DOB: Dec 06, 1970, 49 y.o.   MRN: [id number]  Chief Complaint  Patient presents with  . Follow-up    HPI Patient is in today for follow up on chronic medical concerns. He lost his father a month ago and his depression and anxiety have worsened as a result. His poor appetite and weight loss have accelerated. He has had a poor appetite and experienced weight loss for the past year since he lost his sense of taste and smell. No recent febrile illness or acute hospitalizations. He notes anhedonia and anxiety but he does not note suicidal ideation. He fell about a month ago and flared his back pain but it has returned back to baseline again. He also notes he only eats one meal a day and has had some episodes of sugars at 80 which makes him feel shakey and weak. He has quit sodas and is trying to eat better. Denies CP/palp/SOB/HA/congestion/fever or GU c/o. Taking meds as prescribed  Past Medical History:  Diagnosis Date  . Atypical chest pain 05/08/2017  . Back pain 06/13/2010   Qualifier: Diagnosis of  By: Charlett Blake MD, Sonia Baller back at work in 2002, was able to work until 2004. Ultimately had  Surgeries to his lower back twice and after complications with a spinal leak, was never able to return to work Radicular symptoms occur b/l.   Marland Kitchen Knee pain, bilateral 08/26/2014  . Medicare annual wellness visit,  subsequent 05/07/2016  . Obesity, unspecified 06/13/2010   Qualifier: Diagnosis of  By: Charlett Blake MD, Erline Levine      Past Surgical History:  Procedure Laterality Date  . BACK SURGERY     X 2  . KNEE SURGERY  1999   cartilage repair    Family History  Problem Relation Age of Onset  . Arthritis Other        family hx of  . Diabetes Other        family hx of  . Hypertension Other        family hx of  . Stroke Other        family hx of  . Sudden death Other        family hx of  . Stroke Father   . Diabetes Father   . Hyperlipidemia Father   . Hypertension Father   . Cancer Mother        breast  . Diabetes Mother   . Hyperlipidemia Mother   . Hypertension Mother   . Diabetes Sister   . Arthritis Sister   . Cancer Maternal Grandmother   . Diabetes Sister     Social History   Socioeconomic History  . Marital status: Married    Spouse name: Not on file  . Number of children: Not on file  . Years of education: Not on file  . Highest education level: Not on  file  Occupational History  . Not on file  Tobacco Use  . Smoking status: Current Every Day Smoker    Packs/day: 1.00    Types: Cigarettes  . Smokeless tobacco: Never Used  Substance and Sexual Activity  . Alcohol use: No  . Drug use: No  . Sexual activity: Not on file  Other Topics Concern  . Not on file  Social History Narrative  . Not on file   Social Determinants of Health   Financial Resource Strain:   . Difficulty of Paying Living Expenses:   Food Insecurity:   . Worried About Charity fundraiser in the Last Year:   . Arboriculturist in the Last Year:   Transportation Needs:   . Film/video editor (Medical):   Marland Kitchen Lack of Transportation (Non-Medical):   Physical Activity:   . Days of Exercise per Week:   . Minutes of Exercise per Session:   Stress:   . Feeling of Stress :   Social Connections:   . Frequency of Communication with Friends and Family:   . Frequency of Social Gatherings with  Friends and Family:   . Attends Religious Services:   . Active Member of Clubs or Organizations:   . Attends Archivist Meetings:   Marland Kitchen Marital Status:   Intimate Partner Violence:   . Fear of Current or Ex-Partner:   . Emotionally Abused:   Marland Kitchen Physically Abused:   . Sexually Abused:     Outpatient Medications Prior to Visit  Medication Sig Dispense Refill  . aspirin EC 81 MG tablet Take 1 tablet (81 mg total) by mouth daily.    Marland Kitchen glimepiride (AMARYL) 4 MG tablet Take 1 tablet (4 mg total) by mouth 2 (two) times daily. 60 tablet 5  . glucose blood (BAYER CONTOUR TEST) test strip Check blood glucose once daily and as needed. Dx: E11.9 100 each 5  . Insulin Pen Needle (RELION PEN NEEDLES) 32G X 4 MM MISC UAD to monitor blood glucose bid; Dx: Ell.69 & E66.9 100 each PRN  . Insulin Syringe-Needle U-100 (GLOBAL INSULIN SYRINGES) 30G X 5/16" 0.3 ML MISC Use twice a day with Novolin N 100 each 5  . MICROLET LANCETS MISC Check blood sugar DX E11.9 100 each 5  . NOVOLIN N FLEXPEN RELION 100 UNIT/ML Kiwkpen INJECT 10 UNITS SUBCUTANEOUSLY TWICE DAILY 15 mL 0  . atorvastatin (LIPITOR) 10 MG tablet Take 1 tablet (10 mg total) by mouth at bedtime. 30 tablet 3  . nitroGLYCERIN (NITROSTAT) 0.4 MG SL tablet Place 1 tablet (0.4 mg total) under the tongue every 5 (five) minutes as needed for chest pain. (Patient not taking: Reported on 06/25/2020) 25 tablet 1  . ciprofloxacin (CIPRO) 500 MG tablet Take 1 tablet (500 mg total) by mouth 2 (two) times daily. 20 tablet 0  . HYDROcodone-ibuprofen (VICOPROFEN) 7.5-200 MG tablet Take 1 tablet by mouth every 8 (eight) hours as needed for moderate pain. 90 tablet 0  . tiZANidine (ZANAFLEX) 4 MG tablet Take 1 tablet (4 mg total) by mouth every 6 (six) hours as needed for muscle spasms. 90 tablet 5   No facility-administered medications prior to visit.    Allergies  Allergen Reactions  . Iodides     Review of Systems  Constitutional: Positive for  malaise/fatigue. Negative for fever.  HENT: Negative for congestion.   Eyes: Negative for blurred vision.  Respiratory: Negative for shortness of breath.   Cardiovascular: Negative for chest pain, palpitations and  leg swelling.  Gastrointestinal: Positive for nausea. Negative for abdominal pain and blood in stool.  Genitourinary: Negative for dysuria and frequency.  Musculoskeletal: Positive for back pain, joint pain and myalgias. Negative for falls.  Skin: Negative for rash.  Neurological: Negative for dizziness, loss of consciousness and headaches.  Endo/Heme/Allergies: Negative for environmental allergies.  Psychiatric/Behavioral: Positive for depression. Negative for substance abuse and suicidal ideas. The patient is nervous/anxious. The patient does not have insomnia.        Objective:    Physical Exam Constitutional:      Appearance: Normal appearance. He is not ill-appearing.  HENT:     Head: Normocephalic and atraumatic.     Right Ear: External ear normal.     Left Ear: External ear normal.  Eyes:     General:        Right eye: No discharge.        Left eye: No discharge.  Pulmonary:     Effort: Pulmonary effort is normal.  Neurological:     Mental Status: He is alert and oriented to person, place, and time.  Psychiatric:        Behavior: Behavior normal.     There were no vitals taken for this visit. Wt Readings from Last 3 Encounters:  01/28/19 290 lb 9.6 oz (131.8 kg)  05/14/18 283 lb (128.4 kg)  02/04/18 280 lb 6.4 oz (127.2 kg)    Diabetic Foot Exam - Simple   No data filed     Lab Results  Component Value Date   WBC 6.1 06/21/2020   HGB 15.6 06/21/2020   HCT 47.1 06/21/2020   PLT 237 06/21/2020   GLUCOSE 152 (H) 06/21/2020   CHOL 150 06/21/2020   TRIG 143 06/21/2020   HDL 38 (L) 06/21/2020   LDLDIRECT 117.0 01/28/2019   LDLCALC 87 06/21/2020   ALT 19 06/21/2020   AST 15 06/21/2020   NA 140 06/21/2020   K 4.3 06/21/2020   CL 102 06/21/2020    CREATININE 0.99 06/21/2020   BUN 9 06/21/2020   CO2 23 06/21/2020   TSH 1.490 06/21/2020   HGBA1C 7.7 (H) 06/21/2020   MICROALBUR 0.9 01/28/2019    Lab Results  Component Value Date   TSH 1.490 06/21/2020   Lab Results  Component Value Date   WBC 6.1 06/21/2020   HGB 15.6 06/21/2020   HCT 47.1 06/21/2020   MCV 91 06/21/2020   PLT 237 06/21/2020   Lab Results  Component Value Date   NA 140 06/21/2020   K 4.3 06/21/2020   CO2 23 06/21/2020   GLUCOSE 152 (H) 06/21/2020   BUN 9 06/21/2020   CREATININE 0.99 06/21/2020   BILITOT 0.4 06/21/2020   ALKPHOS 91 06/21/2020   AST 15 06/21/2020   ALT 19 06/21/2020   PROT 6.2 06/21/2020   ALBUMIN 3.9 (L) 06/21/2020   CALCIUM 9.2 06/21/2020   GFR 86.96 01/28/2019   Lab Results  Component Value Date   CHOL 150 06/21/2020   Lab Results  Component Value Date   HDL 38 (L) 06/21/2020   Lab Results  Component Value Date   LDLCALC 87 06/21/2020   Lab Results  Component Value Date   TRIG 143 06/21/2020   Lab Results  Component Value Date   CHOLHDL 3.9 06/21/2020   Lab Results  Component Value Date   HGBA1C 7.7 (H) 06/21/2020       Assessment & Plan:   Problem List Items Addressed This Visit  Mixed hyperlipidemia    Encouraged heart healthy diet, increase exercise, avoid trans fats, consider a krill oil cap daily      Relevant Medications   sildenafil (REVATIO) 20 MG tablet   Depression with anxiety    His father died a month ago and he notes he is more depressed and eating less. He was already loosing weight since he lost his sense of taste and smell last year and he reports his appetite is low. Will start him on Lexapro 10 mg po daily and reassess next month.       Relevant Medications   escitalopram (LEXAPRO) 10 MG tablet   Back pain    He had a fall last month which flared his back pain, he was incapacitated for a couple of weeks but it has improved at this time. He declines referral for further  evaluation at this time. Encouraged moist heat and gentle stretching as tolerated. May try NSAIDs and prescription meds as directed and report if symptoms worsen or seek immediate care      Diabetes mellitus type 2 in obese ([place])    hgba1c has improved from 9.7 to 7.7 but he is only eating one meal a day and when his sugar drops to 80 he feels weak and shaky. He is encouraged to eat a protein filled meal tid and he may drop his Insulin to 8 units bid and monitor his sugars.          I have discontinued Guy Franco Jr.'s tiZANidine, HYDROcodone-ibuprofen, and ciprofloxacin. I am also having him start on escitalopram and sildenafil. Additionally, I am having him maintain his glucose blood, aspirin EC, Microlet Lancets, nitroGLYCERIN, atorvastatin, glimepiride, Insulin Syringe-Needle U-100, ReliOn Pen Needles, and NovoLIN N FlexPen ReliOn.  Meds ordered this encounter  Medications  . escitalopram (LEXAPRO) 10 MG tablet    Sig: Take 1 tablet (10 mg total) by mouth daily.    Dispense:  30 tablet    Refill:  3  . sildenafil (REVATIO) 20 MG tablet    Sig: Take 1-5 tablets (20-100 mg total) by mouth 2 (two) times daily as needed (ED).    Dispense:  30 tablet    Refill:  1    I discussed the assessment and treatment plan with the patient. The patient was provided an opportunity to ask questions and all were answered. The patient agreed with the plan and demonstrated an understanding of the instructions.   The patient was advised to call back or seek an in-person evaluation if the symptoms worsen or if the condition fails to improve as anticipated.  I provided 25 minutes of non-face-to-face time during this encounter.   Penni Homans, MD

## 2020-06-29 NOTE — Telephone Encounter (Signed)
Done

## 2020-07-11 ENCOUNTER — Encounter: Payer: Self-pay | Admitting: Family Medicine

## 2020-07-12 MED ORDER — SILDENAFIL CITRATE 20 MG PO TABS: 20.0000 mg | ORAL_TABLET | Freq: Two times a day (BID) | ORAL | 1 refills | Status: DC | PRN

## 2020-08-20 ENCOUNTER — Other Ambulatory Visit: Payer: Self-pay | Admitting: Family Medicine

## 2020-09-13 ENCOUNTER — Ambulatory Visit (INDEPENDENT_AMBULATORY_CARE_PROVIDER_SITE_OTHER): Payer: Medicare Other | Admitting: Family Medicine

## 2020-09-13 ENCOUNTER — Encounter: Payer: Self-pay | Admitting: Family Medicine

## 2020-09-13 ENCOUNTER — Other Ambulatory Visit: Payer: Self-pay

## 2020-09-13 VITALS — BP 116/74 | HR 63 | Temp 97.5°F | Resp 16 | Ht 74.0 in | Wt 263.7 lb

## 2020-09-13 DIAGNOSIS — G459 Transient cerebral ischemic attack, unspecified: Secondary | ICD-10-CM | POA: Insufficient documentation

## 2020-09-13 DIAGNOSIS — R3911 Hesitancy of micturition: Secondary | ICD-10-CM

## 2020-09-13 DIAGNOSIS — E1169 Type 2 diabetes mellitus with other specified complication: Secondary | ICD-10-CM | POA: Diagnosis not present

## 2020-09-13 DIAGNOSIS — R030 Elevated blood-pressure reading, without diagnosis of hypertension: Secondary | ICD-10-CM | POA: Diagnosis not present

## 2020-09-13 DIAGNOSIS — E669 Obesity, unspecified: Secondary | ICD-10-CM

## 2020-09-13 DIAGNOSIS — E6609 Other obesity due to excess calories: Secondary | ICD-10-CM | POA: Diagnosis not present

## 2020-09-13 DIAGNOSIS — E782 Mixed hyperlipidemia: Secondary | ICD-10-CM | POA: Diagnosis not present

## 2020-09-13 DIAGNOSIS — Z720 Tobacco use: Secondary | ICD-10-CM | POA: Diagnosis not present

## 2020-09-13 DIAGNOSIS — R0789 Other chest pain: Secondary | ICD-10-CM | POA: Diagnosis not present

## 2020-09-13 DIAGNOSIS — R531 Weakness: Secondary | ICD-10-CM | POA: Diagnosis not present

## 2020-09-13 DIAGNOSIS — R300 Dysuria: Secondary | ICD-10-CM

## 2020-09-13 MED ORDER — BUPROPION HCL ER (XL) 150 MG PO TB24: ORAL_TABLET | ORAL | 3 refills | Status: DC

## 2020-09-13 NOTE — Assessment & Plan Note (Signed)
In last week he reports an episode of 2-3 hours of left sided arm and leg weakness which resolved spontaneously and has not recurred. He is referred to neurology for further consideration

## 2020-09-13 NOTE — Patient Instructions (Signed)
Sliding scale for regular insulin  200-250 take 2 Units 251-300 take 4 Units 301-350 take 6 Units 351-400 take 8 units  401-450 take 10 units

## 2020-09-13 NOTE — Assessment & Plan Note (Signed)
Notes increased hesitany with dysuria at times and a FH of prostate cancer check UA with culture and PSA today and he is referred to urology for further consideration.

## 2020-09-13 NOTE — Assessment & Plan Note (Signed)
Encouraged DASH diet, decrease po intake and increase exercise as tolerated. Needs 7-8 hours of sleep nightly. Avoid trans fats, eat small, frequent meals every 4-5 hours with lean proteins, complex carbs and healthy fats. Minimize simple carbs, has worked hard and given up all alcohol, all sodas and has cut down his portions.

## 2020-09-13 NOTE — Assessment & Plan Note (Signed)
Continues to smoke about a pack per day encouraged to completely quit to improve his future health

## 2020-09-13 NOTE — Assessment & Plan Note (Signed)
Well controlled today. No need for further changes

## 2020-09-13 NOTE — Assessment & Plan Note (Signed)
Encouraged heart healthy diet, increase exercise, avoid trans fats, consider a krill oil cap daily 

## 2020-09-13 NOTE — Progress Notes (Signed)
Patient ID: Gary Short., male   DOB: 1971/07/26, 49 y.o.   MRN: [id number]   Subjective:    Patient ID: Gary Short., male    DOB: 12-15-1970, 49 y.o.   MRN: [id number]  Chief Complaint  Patient presents with  . Follow-up  . Hyperlipidemia    HPI Patient is in today for follow up on  Chronic medical concerns. No recent febrile illness or hospitalizations. He is not feeling well. He notes urinary hesitancy and dysuria. He has been working on weight loss by cutting out sodas and alcohol and eating better. He notes he lost his sense of taste and smell with likely episode of covid last year and then for the longest time his sense taste made everything taste like gasoline. Earlier this week he had 2-3 hours of left upper chest wall pain with weakness and numbness in left arm and leg at same time.His heart was racingalso. Symptoms have not recurred but have worried him. Denies palp/SOB/congestion/fevers/GI or GU c/o. Taking meds as prescribed  Past Medical History:  Diagnosis Date  . Atypical chest pain 05/08/2017  . Back pain 06/13/2010   Qualifier: Diagnosis of  By: Charlett Blake MD, Sonia Baller back at work in 2002, was able to work until 2004. Ultimately had  Surgeries to his lower back twice and after complications with a spinal leak, was never able to return to work Radicular symptoms occur b/l.   Marland Kitchen Knee pain, bilateral 08/26/2014  . Medicare annual wellness visit, subsequent 05/07/2016  . Obesity, unspecified 06/13/2010   Qualifier: Diagnosis of  By: Charlett Blake MD, Erline Levine      Past Surgical History:  Procedure Laterality Date  . BACK SURGERY     X 2  . KNEE SURGERY  1999   cartilage repair    Family History  Problem Relation Age of Onset  . Arthritis Other        family hx of  . Diabetes Other        family hx of  . Hypertension Other        family hx of  . Stroke Other        family hx of  . Sudden death Other        family hx of  . Stroke Father   . Diabetes Father   .  Hyperlipidemia Father   . Hypertension Father   . Cancer Mother        breast  . Diabetes Mother   . Hyperlipidemia Mother   . Hypertension Mother   . Diabetes Sister   . Arthritis Sister   . Cancer Maternal Grandmother   . Diabetes Sister     Social History   Socioeconomic History  . Marital status: Married    Spouse name: Not on file  . Number of children: Not on file  . Years of education: Not on file  . Highest education level: Not on file  Occupational History  . Not on file  Tobacco Use  . Smoking status: Current Every Day Smoker    Packs/day: 1.00    Types: Cigarettes  . Smokeless tobacco: Never Used  Substance and Sexual Activity  . Alcohol use: No  . Drug use: No  . Sexual activity: Not on file  Other Topics Concern  . Not on file  Social History Narrative  . Not on file   Social Determinants of Health   Financial Resource Strain:   . Difficulty of Paying Living Expenses:  Not on file  Food Insecurity:   . Worried About Charity fundraiser in the Last Year: Not on file  . Ran Out of Food in the Last Year: Not on file  Transportation Needs:   . Lack of Transportation (Medical): Not on file  . Lack of Transportation (Non-Medical): Not on file  Physical Activity:   . Days of Exercise per Week: Not on file  . Minutes of Exercise per Session: Not on file  Stress:   . Feeling of Stress : Not on file  Social Connections:   . Frequency of Communication with Friends and Family: Not on file  . Frequency of Social Gatherings with Friends and Family: Not on file  . Attends Religious Services: Not on file  . Active Member of Clubs or Organizations: Not on file  . Attends Archivist Meetings: Not on file  . Marital Status: Not on file  Intimate Partner Violence:   . Fear of Current or Ex-Partner: Not on file  . Emotionally Abused: Not on file  . Physically Abused: Not on file  . Sexually Abused: Not on file    Outpatient Medications Prior to Visit   Medication Sig Dispense Refill  . aspirin EC 81 MG tablet Take 1 tablet (81 mg total) by mouth daily.    Marland Kitchen glucose blood (BAYER CONTOUR TEST) test strip Check blood glucose once daily and as needed. Dx: E11.9 100 each 5  . Insulin Pen Needle (RELION PEN NEEDLES) 32G X 4 MM MISC UAD to monitor blood glucose bid; Dx: Ell.69 & E66.9 100 each PRN  . Insulin Syringe-Needle U-100 (GLOBAL INSULIN SYRINGES) 30G X 5/16" 0.3 ML MISC Use twice a day with Novolin N 100 each 5  . MICROLET LANCETS MISC Check blood sugar DX E11.9 100 each 5  . NOVOLIN N FLEXPEN RELION 100 UNIT/ML Kiwkpen INJECT 10 UNITS SUBCUTANEOUSLY TWICE DAILY 15 mL 0  . sildenafil (REVATIO) 20 MG tablet Take 1-5 tablets (20-100 mg total) by mouth 2 (two) times daily as needed (ED). 30 tablet 1  . atorvastatin (LIPITOR) 10 MG tablet Take 1 tablet (10 mg total) by mouth at bedtime. (Patient not taking: Reported on 09/13/2020) 30 tablet 3  . escitalopram (LEXAPRO) 10 MG tablet Take 1 tablet (10 mg total) by mouth daily. 30 tablet 3  . glimepiride (AMARYL) 4 MG tablet Take 1 tablet (4 mg total) by mouth 2 (two) times daily. (Patient not taking: Reported on 09/13/2020) 60 tablet 5  . nitroGLYCERIN (NITROSTAT) 0.4 MG SL tablet Place 1 tablet (0.4 mg total) under the tongue every 5 (five) minutes as needed for chest pain. (Patient not taking: Reported on 06/25/2020) 25 tablet 1   No facility-administered medications prior to visit.    Allergies  Allergen Reactions  . Iodides     Review of Systems  Constitutional: Positive for malaise/fatigue. Negative for fever.  HENT: Negative for congestion.   Eyes: Negative for blurred vision.  Respiratory: Negative for shortness of breath.   Cardiovascular: Positive for chest pain. Negative for palpitations and leg swelling.  Gastrointestinal: Negative for abdominal pain, blood in stool and nausea.  Genitourinary: Positive for dysuria and urgency. Negative for frequency and hematuria.    Musculoskeletal: Negative for falls.  Skin: Negative for rash.  Neurological: Negative for dizziness, loss of consciousness and headaches.  Endo/Heme/Allergies: Negative for environmental allergies.  Psychiatric/Behavioral: Negative for depression. The patient is not nervous/anxious.        Objective:    Physical  Exam Vitals and nursing note reviewed.  Constitutional:      General: He is not in acute distress.    Appearance: He is well-developed.  HENT:     Head: Normocephalic and atraumatic.     Nose: Nose normal.  Eyes:     General:        Right eye: No discharge.        Left eye: No discharge.  Cardiovascular:     Rate and Rhythm: Normal rate and regular rhythm.     Heart sounds: No murmur heard.   Pulmonary:     Effort: Pulmonary effort is normal.     Breath sounds: Normal breath sounds.  Abdominal:     General: Bowel sounds are normal.     Palpations: Abdomen is soft.     Tenderness: There is no abdominal tenderness.  Musculoskeletal:     Cervical back: Normal range of motion and neck supple.  Skin:    General: Skin is warm and dry.  Neurological:     Mental Status: He is alert and oriented to person, place, and time.     BP 116/74 (BP Location: Left Arm)   Pulse 63   Temp (!) 97.5 F (36.4 C) (Oral)   Resp 16   Ht 6\' 2"  (1.88 m)   Wt 263 lb 11.2 oz (119.6 kg)   SpO2 99%   BMI 33.86 kg/m  Wt Readings from Last 3 Encounters:  09/13/20 263 lb 11.2 oz (119.6 kg)  01/28/19 290 lb 9.6 oz (131.8 kg)  05/14/18 283 lb (128.4 kg)    Diabetic Foot Exam - Simple   No data filed     Lab Results  Component Value Date   WBC 7.3 09/13/2020   HGB 16.3 09/13/2020   HCT 47.2 09/13/2020   PLT 235 09/13/2020   GLUCOSE 152 (H) 06/21/2020   CHOL 150 06/21/2020   TRIG 143 06/21/2020   HDL 38 (L) 06/21/2020   LDLDIRECT 117.0 01/28/2019   LDLCALC 87 06/21/2020   ALT 19 06/21/2020   AST 15 06/21/2020   NA 140 06/21/2020   K 4.3 06/21/2020   CL 102 06/21/2020    CREATININE 0.99 06/21/2020   BUN 9 06/21/2020   CO2 23 06/21/2020   TSH 1.490 06/21/2020   HGBA1C 7.7 (H) 06/21/2020   MICROALBUR 0.9 01/28/2019    Lab Results  Component Value Date   TSH 1.490 06/21/2020   Lab Results  Component Value Date   WBC 7.3 09/13/2020   HGB 16.3 09/13/2020   HCT 47.2 09/13/2020   MCV 89.7 09/13/2020   PLT 235 09/13/2020   Lab Results  Component Value Date   NA 140 06/21/2020   K 4.3 06/21/2020   CO2 23 06/21/2020   GLUCOSE 152 (H) 06/21/2020   BUN 9 06/21/2020   CREATININE 0.99 06/21/2020   BILITOT 0.4 06/21/2020   ALKPHOS 91 06/21/2020   AST 15 06/21/2020   ALT 19 06/21/2020   PROT 6.2 06/21/2020   ALBUMIN 3.9 (L) 06/21/2020   CALCIUM 9.2 06/21/2020   GFR 86.96 01/28/2019   Lab Results  Component Value Date   CHOL 150 06/21/2020   Lab Results  Component Value Date   HDL 38 (L) 06/21/2020   Lab Results  Component Value Date   LDLCALC 87 06/21/2020   Lab Results  Component Value Date   TRIG 143 06/21/2020   Lab Results  Component Value Date   CHOLHDL 3.9 06/21/2020   Lab Results  Component Value Date   HGBA1C 7.7 (H) 06/21/2020       Assessment & Plan:   Problem List Items Addressed This Visit    Mixed hyperlipidemia    Encouraged heart healthy diet, increase exercise, avoid trans fats, consider a krill oil cap daily      Relevant Orders   Ambulatory referral to Cardiology   Obesity    Encouraged DASH diet, decrease po intake and increase exercise as tolerated. Needs 7-8 hours of sleep nightly. Avoid trans fats, eat small, frequent meals every 4-5 hours with lean proteins, complex carbs and healthy fats. Minimize simple carbs, has worked hard and given up all alcohol, all sodas and has cut down his portions.       ELEVATED BLOOD PRESSURE WITHOUT DIAGNOSIS OF HYPERTENSION    Well controlled today. No need for further changes      Diabetes mellitus type 2 in obese [facility]) - Primary   Relevant Orders   PSA    Urinalysis   Urine Culture   CBC (Completed)   Ambulatory referral to Cardiology   Atypical chest pain   Relevant Orders   CBC (Completed)   Ambulatory referral to Cardiology   Tobacco abuse    Continues to smoke about a pack per day encouraged to completely quit to improve his future health      Relevant Orders   Ambulatory referral to Cardiology   Urinary hesitancy    Notes increased hesitany with dysuria at times and a FH of prostate cancer check UA with culture and PSA today and he is referred to urology for further consideration.      Relevant Orders   PSA   Urinalysis   Urine Culture   Comprehensive metabolic panel   CBC (Completed)   Ambulatory referral to Urology   TIA (transient ischemic attack)    In last week he reports an episode of 2-3 hours of left sided arm and leg weakness which resolved spontaneously and has not recurred. He is referred to neurology for further consideration      Relevant Orders   Ambulatory referral to Neurology    Other Visit Diagnoses    Left-sided weakness       Relevant Orders   Ambulatory referral to Neurology   Dysuria       Relevant Orders   Ambulatory referral to Urology      I am having Gary Short. "Gary Short" start on buPROPion. I am also having him maintain his glucose blood, aspirin EC, Microlet Lancets, nitroGLYCERIN, atorvastatin, glimepiride, Insulin Syringe-Needle U-100, ReliOn Pen Needles, escitalopram, sildenafil, and NovoLIN N FlexPen ReliOn.  Meds ordered this encounter  Medications  . buPROPion (WELLBUTRIN XL) 150 MG 24 hr tablet    Sig: 150 mg po daily x 4 days and then 300 mg po daily    Dispense:  60 tablet    Refill:  3     Penni Homans, MD

## 2020-09-14 ENCOUNTER — Other Ambulatory Visit: Payer: Self-pay | Admitting: *Deleted

## 2020-09-14 ENCOUNTER — Telehealth: Payer: Self-pay | Admitting: *Deleted

## 2020-09-14 DIAGNOSIS — R300 Dysuria: Secondary | ICD-10-CM

## 2020-09-14 DIAGNOSIS — R3911 Hesitancy of micturition: Secondary | ICD-10-CM

## 2020-09-14 LAB — CBC
HCT: 47.2 % (ref 38.5–50.0)
Hemoglobin: 16.3 g/dL (ref 13.2–17.1)
MCH: 31 pg (ref 27.0–33.0)
MCHC: 34.5 g/dL (ref 32.0–36.0)
MCV: 89.7 fL (ref 80.0–100.0)
MPV: 12.5 fL (ref 7.5–12.5)
Platelets: 235 10*3/uL (ref 140–400)
RBC: 5.26 10*6/uL (ref 4.20–5.80)
RDW: 12.8 % (ref 11.0–15.0)
WBC: 7.3 10*3/uL (ref 3.8–10.8)

## 2020-09-14 LAB — COMPREHENSIVE METABOLIC PANEL
AG Ratio: 1.7 (calc) (ref 1.0–2.5)
ALT: 19 U/L (ref 9–46)
AST: 15 U/L (ref 10–40)
Albumin: 4.3 g/dL (ref 3.6–5.1)
Alkaline phosphatase (APISO): 73 U/L (ref 36–130)
BUN: 12 mg/dL (ref 7–25)
CO2: 26 mmol/L (ref 20–32)
Calcium: 9.4 mg/dL (ref 8.6–10.3)
Chloride: 103 mmol/L (ref 98–110)
Creat: 1.03 mg/dL (ref 0.60–1.35)
Globulin: 2.5 g/dL (calc) (ref 1.9–3.7)
Glucose, Bld: 123 mg/dL — ABNORMAL HIGH (ref 65–99)
Potassium: 4.3 mmol/L (ref 3.5–5.3)
Sodium: 139 mmol/L (ref 135–146)
Total Bilirubin: 0.8 mg/dL (ref 0.2–1.2)
Total Protein: 6.8 g/dL (ref 6.1–8.1)

## 2020-09-14 LAB — PSA: PSA: 0.22 ng/mL (ref <=4.0)

## 2020-09-14 NOTE — Telephone Encounter (Signed)
[facility] was unable to urine culture and urinalysis due speciman was sent in wrong containers.  Patient notified and he will go and recollect speciman.    Patient will recollect at [place] in [place].  Orders has been faxed.  [facility] AT [facility] [address] [place], [place] [address]   HOURS: [place] 8:00AM-5:00PM CONTACT [phone] Fax: [phone]

## 2020-09-14 NOTE — Addendum Note (Signed)
Addended by: Kem Boroughs D on: 09/14/2020 05:05 PM   Modules accepted: Orders

## 2020-09-15 LAB — URINE CULTURE

## 2020-09-15 LAB — TIQ-MISC

## 2020-09-17 NOTE — Telephone Encounter (Signed)
I have tried for 4 days to fax over order to [facility] and the fax failed.  I also called and left messages for them to call back to see if they have a different fax number but I have yet to receive a call back yet.   Called and spoke with wife to let her know that when they go to please call me at that time so that I could speak with someone to get the orders to them with maybe a different fax number.

## 2020-10-03 ENCOUNTER — Other Ambulatory Visit: Payer: Self-pay | Admitting: Family Medicine

## 2020-11-25 ENCOUNTER — Encounter: Payer: Self-pay | Admitting: Family Medicine

## 2020-12-01 ENCOUNTER — Other Ambulatory Visit: Payer: Self-pay | Admitting: Family Medicine

## 2020-12-09 ENCOUNTER — Encounter: Payer: Self-pay | Admitting: Family Medicine

## 2020-12-17 ENCOUNTER — Telehealth: Payer: Self-pay | Admitting: Family Medicine

## 2020-12-17 MED ORDER — SILDENAFIL CITRATE 20 MG PO TABS
ORAL_TABLET | ORAL | 3 refills | Status: DC
Start: 2020-12-17 — End: 2021-06-02

## 2020-12-17 NOTE — Telephone Encounter (Signed)
Opened in error

## 2020-12-17 NOTE — Telephone Encounter (Signed)
Yes it is OK to send in for just Sildenafil once a day. TY

## 2020-12-17 NOTE — Telephone Encounter (Signed)
Spoke with patients wife and advised her that we sent in prescription for once a day.

## 2020-12-17 NOTE — Telephone Encounter (Signed)
Patient wife states that patient med Sildenafil was incorrect inputted it should read once daily not twice a day and please resubmitted prescription

## 2020-12-17 NOTE — Telephone Encounter (Signed)
We have always sent in for twice a day.  Are you ok with once a day?

## 2021-01-17 ENCOUNTER — Ambulatory Visit: Payer: Medicare Other | Admitting: Family Medicine

## 2021-05-09 ENCOUNTER — Encounter: Payer: Self-pay | Admitting: Family Medicine

## 2021-05-11 ENCOUNTER — Telehealth (INDEPENDENT_AMBULATORY_CARE_PROVIDER_SITE_OTHER): Payer: Medicare Other | Admitting: Family Medicine

## 2021-05-11 ENCOUNTER — Other Ambulatory Visit: Payer: Self-pay

## 2021-05-11 DIAGNOSIS — H02203 Unspecified lagophthalmos right eye, unspecified eyelid: Secondary | ICD-10-CM

## 2021-05-11 DIAGNOSIS — R200 Anesthesia of skin: Secondary | ICD-10-CM | POA: Diagnosis not present

## 2021-05-11 DIAGNOSIS — H538 Other visual disturbances: Secondary | ICD-10-CM

## 2021-05-11 DIAGNOSIS — E669 Obesity, unspecified: Secondary | ICD-10-CM

## 2021-05-11 DIAGNOSIS — R29898 Other symptoms and signs involving the musculoskeletal system: Secondary | ICD-10-CM | POA: Diagnosis not present

## 2021-05-11 DIAGNOSIS — W57XXXA Bitten or stung by nonvenomous insect and other nonvenomous arthropods, initial encounter: Secondary | ICD-10-CM

## 2021-05-11 DIAGNOSIS — R519 Headache, unspecified: Secondary | ICD-10-CM

## 2021-05-11 DIAGNOSIS — E1169 Type 2 diabetes mellitus with other specified complication: Secondary | ICD-10-CM

## 2021-05-11 DIAGNOSIS — R29810 Facial weakness: Secondary | ICD-10-CM

## 2021-05-11 DIAGNOSIS — G43009 Migraine without aura, not intractable, without status migrainosus: Secondary | ICD-10-CM

## 2021-05-11 MED ORDER — DOXYCYCLINE HYCLATE 100 MG PO TABS: 100.0000 mg | ORAL_TABLET | Freq: Two times a day (BID) | ORAL | 0 refills | Status: DC

## 2021-05-11 NOTE — Assessment & Plan Note (Signed)
Right side of face unable to smile, raise eyebrow and forehead consider post viral prodrome vs neurologic concern. Recent CT scan at [facility] in [place] on 6/12, symptoms have improved since then. He left against medical advice after roughly 12 hours. He is referred to neurology for further evaluation and MRI of brain is ordered. He is aware that if his symptoms worsen again he has to return to the ER

## 2021-05-11 NOTE — Assessment & Plan Note (Signed)
Referred to opthamology for evaluation given his inability to close his right eye and intermittent blurry vision. He is advised to use hydrating eye drops every hour throughout the day til seen by opthamology

## 2021-05-11 NOTE — Assessment & Plan Note (Signed)
Check MRI of brain and referred to neurology for evaluation

## 2021-05-11 NOTE — Assessment & Plan Note (Signed)
hgba1c acceptable, minimize simple carbs. Increase exercise as tolerated. Continue current meds 

## 2021-05-11 NOTE — Assessment & Plan Note (Signed)
Suffered a tick bit roughly 1 week prior to his symptom onset. Will send him for labs to evaluate and confirm diagnosis and he is started on Doxycycline 100 mg po bid

## 2021-05-11 NOTE — Assessment & Plan Note (Signed)
Encouraged increased hydration, 64 ounces of clear fluids daily. Minimize alcohol and caffeine. Eat small frequent meals with lean proteins and complex carbs. Avoid high and low blood sugars. Get adequate sleep, 7-8 hours a night. Needs exercise daily preferably in the morning.  

## 2021-05-11 NOTE — Progress Notes (Signed)
MyChart Video Visit    Virtual Visit via Video Note   This visit type was conducted due to national recommendations for restrictions regarding the COVID-19 Pandemic (e.g. social distancing) in an effort to limit this patient's exposure and mitigate transmission in our community. This patient is at least at moderate risk for complications without adequate follow up. This format is felt to be most appropriate for this patient at this time. Physical exam was limited by quality of the video and audio technology used for the visit. S Chism, CMA was able to get the patient set up on a video visit.  Patient location: home, Patient, wife and provider in visit Provider location: Office  I discussed the limitations of evaluation and management by telemedicine and the availability of in person appointments. The patient expressed understanding and agreed to proceed.  Visit Date: 05/11/2021  Today's healthcare provider: Penni Homans, MD     Subjective:    Patient ID: Gary Gurney., male    DOB: 09-28-1971, 50 y.o.   MRN: [id number]  Chief Complaint  Patient presents with   Follow-up   Heat Exposure    HPI Patient is in today for evaluation of new neurologic concerns.  On 05/07/2021 he woke up in the morning feeling mostly himself but a mild headache.  Throughout the day he noticed the right side of his face was drooping slightly and felt funny but then throughout the day it seemed to improve.  Then by that evening it was notably worse he had headache and was unable to close his right eye and he had intermittent blurry vision he had some neck pain some pain radiating into his right arm with a heaviness in the right arm as well.  He denies ever having had symptoms in his leg.  He denies any period of illness fall or trauma prior to the onset of the symptoms or since then.  No fevers or chills.  He notes that his symptoms are currently improving.  On 05/08/2021 he did present to [facility] in  [facility] for evaluation of his symptoms but after 12 hours he left against advice when they had advised that he stay for least 24 hours of monitoring.  Was able to review the lab work from the hospital visit and there was no concerns the CT is of his head only showed a polyp in one of his sinuses.  No acute neurologic or brain lesions noted.  At this point he notes the symptoms in his arm and neck are improving he has some tingling and some weakness but they are much better.  His headache persists but is improved.  He notes trouble closing his eyes still and is using hydrating drops.  He also notes half of his tongue is numb on the right side.  He did suffer a tick bite the week before onset of symptoms but denies any rash. Denies CP/palp/SOB/congestion/fevers/GI or GU c/o. Taking meds as prescribed   Past Medical History:  Diagnosis Date   Atypical chest pain 05/08/2017   Back pain 06/13/2010   Qualifier: Diagnosis of  By: Charlett Blake MD, Sonia Baller back at work in 2002, was able to work until 2004. Ultimately had  Surgeries to his lower back twice and after complications with a spinal leak, was never able to return to work Radicular symptoms occur b/l.    Knee pain, bilateral 08/26/2014   Medicare annual wellness visit, subsequent 05/07/2016   Obesity, unspecified  06/13/2010   Qualifier: Diagnosis of  By: Charlett Blake MD, Erline Levine      Past Surgical History:  Procedure Laterality Date   BACK SURGERY     X 2   KNEE SURGERY  1999   cartilage repair    Family History  Problem Relation Age of Onset   Arthritis Other        family hx of   Diabetes Other        family hx of   Hypertension Other        family hx of   Stroke Other        family hx of   Sudden death Other        family hx of   Stroke Father    Diabetes Father    Hyperlipidemia Father    Hypertension Father    Cancer Mother        breast   Diabetes Mother    Hyperlipidemia Mother    Hypertension Mother    Diabetes  Sister    Arthritis Sister    Cancer Maternal Grandmother    Diabetes Sister     Social History   Socioeconomic History   Marital status: Married    Spouse name: Not on file   Number of children: Not on file   Years of education: Not on file   Highest education level: Not on file  Occupational History   Not on file  Tobacco Use   Smoking status: Every Day    Packs/day: 1.00    Pack years: 0.00    Types: Cigarettes   Smokeless tobacco: Never  Substance and Sexual Activity   Alcohol use: No   Drug use: No   Sexual activity: Not on file  Other Topics Concern   Not on file  Social History Narrative   Not on file   Social Determinants of Health   Financial Resource Strain: Not on file  Food Insecurity: Not on file  Transportation Needs: Not on file  Physical Activity: Not on file  Stress: Not on file  Social Connections: Not on file  Intimate Partner Violence: Not on file    Outpatient Medications Prior to Visit  Medication Sig Dispense Refill   aspirin EC 81 MG tablet Take 1 tablet (81 mg total) by mouth daily.     buPROPion (WELLBUTRIN XL) 150 MG 24 hr tablet 150 mg po daily x 4 days and then 300 mg po daily 60 tablet 3   glucose blood (BAYER CONTOUR TEST) test strip Check blood glucose once daily and as needed. Dx: E11.9 100 each 5   Insulin Pen Needle (RELION PEN NEEDLES) 32G X 4 MM MISC UAD to monitor blood glucose bid; Dx: Ell.69 & E66.9 100 each PRN   Insulin Syringe-Needle U-100 (GLOBAL INSULIN SYRINGES) 30G X 5/16" 0.3 ML MISC Use twice a day with Novolin N 100 each 5   MICROLET LANCETS MISC Check blood sugar DX E11.9 100 each 5   NOVOLIN N FLEXPEN RELION 100 UNIT/ML Kiwkpen INJECT 10 UNITS SUBCUTANEOUSLY TWICE DAILY 15 mL 0   sildenafil (REVATIO) 20 MG tablet TAKE 1 TO 5 TABLETS BY MOUTH ONCE DAILY AS NEEDED FOR ERECTILE DYSFUNCTION 30 tablet 3   atorvastatin (LIPITOR) 10 MG tablet Take 1 tablet (10 mg total) by mouth at bedtime. (Patient not taking: No sig  reported) 30 tablet 3   glimepiride (AMARYL) 4 MG tablet Take 1 tablet (4 mg total) by mouth 2 (two) times daily. (Patient not  taking: No sig reported) 60 tablet 5   nitroGLYCERIN (NITROSTAT) 0.4 MG SL tablet Place 1 tablet (0.4 mg total) under the tongue every 5 (five) minutes as needed for chest pain. (Patient not taking: No sig reported) 25 tablet 1   escitalopram (LEXAPRO) 10 MG tablet Take 1 tablet (10 mg total) by mouth daily. 30 tablet 3   No facility-administered medications prior to visit.    Allergies  Allergen Reactions   Iodides     Review of Systems  Constitutional:  Positive for malaise/fatigue. Negative for chills and fever.  HENT:  Negative for congestion.   Eyes:  Positive for blurred vision. Negative for discharge.  Respiratory:  Negative for shortness of breath.   Cardiovascular:  Negative for chest pain, palpitations and leg swelling.  Gastrointestinal:  Negative for abdominal pain, blood in stool and nausea.  Genitourinary:  Negative for dysuria and frequency.  Musculoskeletal:  Positive for joint pain, myalgias and neck pain. Negative for falls.  Skin:  Negative for rash.  Neurological:  Positive for tingling, focal weakness and headaches. Negative for dizziness and loss of consciousness.  Endo/Heme/Allergies:  Negative for environmental allergies.  Psychiatric/Behavioral:  Negative for depression. The patient is nervous/anxious.       Objective:    Physical Exam Constitutional:      General: He is not in acute distress.    Appearance: Normal appearance. He is not ill-appearing or toxic-appearing.  HENT:     Head: Normocephalic and atraumatic.     Comments: Right face, right corner of mouth drooping. Unable to smile, unable to raise eye brow or forehead. Cannot fully close eyelids of right eye    Right Ear: External ear normal.     Left Ear: External ear normal.     Nose: Nose normal.  Eyes:     General:        Right eye: No discharge.        Left eye:  No discharge.  Pulmonary:     Effort: Pulmonary effort is normal.  Skin:    Findings: No rash.  Neurological:     Mental Status: He is alert and oriented to person, place, and time.  Psychiatric:        Behavior: Behavior normal.    There were no vitals taken for this visit. Wt Readings from Last 3 Encounters:  09/13/20 263 lb 11.2 oz (119.6 kg)  01/28/19 290 lb 9.6 oz (131.8 kg)  05/14/18 283 lb (128.4 kg)    Diabetic Foot Exam - Simple   No data filed    Lab Results  Component Value Date   WBC 7.3 09/13/2020   HGB 16.3 09/13/2020   HCT 47.2 09/13/2020   PLT 235 09/13/2020   GLUCOSE 123 (H) 09/13/2020   CHOL 150 06/21/2020   TRIG 143 06/21/2020   HDL 38 (L) 06/21/2020   LDLDIRECT 117.0 01/28/2019   LDLCALC 87 06/21/2020   ALT 19 09/13/2020   AST 15 09/13/2020   NA 139 09/13/2020   K 4.3 09/13/2020   CL 103 09/13/2020   CREATININE 1.03 09/13/2020   BUN 12 09/13/2020   CO2 26 09/13/2020   TSH 1.490 06/21/2020   PSA 0.22 09/13/2020   HGBA1C 7.7 (H) 06/21/2020   MICROALBUR 0.9 01/28/2019    Lab Results  Component Value Date   TSH 1.490 06/21/2020   Lab Results  Component Value Date   WBC 7.3 09/13/2020   HGB 16.3 09/13/2020   HCT 47.2 09/13/2020  MCV 89.7 09/13/2020   PLT 235 09/13/2020   Lab Results  Component Value Date   NA 139 09/13/2020   K 4.3 09/13/2020   CO2 26 09/13/2020   GLUCOSE 123 (H) 09/13/2020   BUN 12 09/13/2020   CREATININE 1.03 09/13/2020   BILITOT 0.8 09/13/2020   ALKPHOS 91 06/21/2020   AST 15 09/13/2020   ALT 19 09/13/2020   PROT 6.8 09/13/2020   ALBUMIN 3.9 (L) 06/21/2020   CALCIUM 9.4 09/13/2020   GFR 86.96 01/28/2019   Lab Results  Component Value Date   CHOL 150 06/21/2020   Lab Results  Component Value Date   HDL 38 (L) 06/21/2020   Lab Results  Component Value Date   LDLCALC 87 06/21/2020   Lab Results  Component Value Date   TRIG 143 06/21/2020   Lab Results  Component Value Date   CHOLHDL 3.9  06/21/2020   Lab Results  Component Value Date   HGBA1C 7.7 (H) 06/21/2020       Assessment & Plan:   Problem List Items Addressed This Visit     Migraine without aura    Encouraged increased hydration, 64 ounces of clear fluids daily. Minimize alcohol and caffeine. Eat small frequent meals with lean proteins and complex carbs. Avoid high and low blood sugars. Get adequate sleep, 7-8 hours a night. Needs exercise daily preferably in the morning.       Diabetes mellitus type 2 in obese (HCC)    hgba1c acceptable, minimize simple carbs. Increase exercise as tolerated. Continue current meds       Weakness of right upper extremity    Check MRI of brain and referred to neurology for evaluation       Relevant Orders   MR Brain W Wo Contrast   Ambulatory referral to Neurology   Eyelid closing impairment, right    Referred to opthamology for evaluation given his inability to close his right eye and intermittent blurry vision. He is advised to use hydrating eye drops every hour throughout the day til seen by opthamology       Relevant Orders   Ambulatory referral to Ophthalmology   Ambulatory referral to Neurology   Mouth droop due to facial weakness - Primary    Right side of face unable to smile, raise eyebrow and forehead consider post viral prodrome vs neurologic concern. Recent CT scan at [facility] in [place] on 6/12, symptoms have improved since then. He left against medical advice after roughly 12 hours. He is referred to neurology for further evaluation and MRI of brain is ordered. He is aware that if his symptoms worsen again he has to return to the ER       Relevant Orders   MR Brain W Wo Contrast   Ambulatory referral to Neurology   CBC with Differential/Platelet   Comprehensive metabolic panel   C-reactive protein   Ehrlichia antibody panel   Lyme Disease Serology w/Reflex   Rocky mtn spotted fvr abs pnl(IgG+IgM)   Sedimentation rate   Numbness of  tongue   Relevant Orders   MR Brain W Wo Contrast   Ambulatory referral to Neurology   Tick bite    Suffered a tick bit roughly 1 week prior to his symptom onset. Will send him for labs to evaluate and confirm diagnosis and he is started on Doxycycline 100 mg po bid       Relevant Orders   CBC with Differential/Platelet   Comprehensive metabolic panel  C-reactive protein   Ehrlichia antibody panel   Lyme Disease Serology w/Reflex   Rocky mtn spotted fvr abs pnl(IgG+IgM)   Sedimentation rate   Nonintractable headache   Relevant Orders   CBC with Differential/Platelet   Comprehensive metabolic panel   C-reactive protein   Ehrlichia antibody panel   Lyme Disease Serology w/Reflex   Rocky mtn spotted fvr abs pnl(IgG+IgM)   Sedimentation rate   Other Visit Diagnoses     Blurry vision, right eye       Relevant Orders   Ambulatory referral to Ophthalmology   Ambulatory referral to Neurology       I have discontinued Gary Gurney. "Gary Short"'s escitalopram. I am also having him start on doxycycline. Additionally, I am having him maintain his glucose blood, aspirin EC, Microlet Lancets, nitroGLYCERIN, atorvastatin, glimepiride, Insulin Syringe-Needle U-100, ReliOn Pen Needles, NovoLIN N FlexPen ReliOn, buPROPion, and sildenafil.  Meds ordered this encounter  Medications   doxycycline (VIBRA-TABS) 100 MG tablet    Sig: Take 1 tablet (100 mg total) by mouth 2 (two) times daily.    Dispense:  20 tablet    Refill:  0    I discussed the assessment and treatment plan with the patient. The patient was provided an opportunity to ask questions and all were answered. The patient agreed with the plan and demonstrated an understanding of the instructions.   The patient was advised to call back or seek an in-person evaluation if the symptoms worsen or if the condition fails to improve as anticipated.  I provided 30 minutes of face-to-face time during this encounter.   Penni Homans,  MD [facility] at [facility] [phone] (phone) [phone] (fax)  [place]

## 2021-05-19 ENCOUNTER — Encounter: Payer: Self-pay | Admitting: Family Medicine

## 2021-05-19 ENCOUNTER — Telehealth: Payer: Self-pay

## 2021-05-19 NOTE — Telephone Encounter (Signed)
Called to pt to set up lab and f/u visit

## 2021-05-19 NOTE — Telephone Encounter (Signed)
Called to set up lab appointment and f/u visit

## 2021-06-02 ENCOUNTER — Other Ambulatory Visit: Payer: Self-pay | Admitting: Family Medicine

## 2021-06-07 ENCOUNTER — Other Ambulatory Visit: Payer: Self-pay

## 2021-06-07 ENCOUNTER — Ambulatory Visit
Admission: RE | Admit: 2021-06-07 | Discharge: 2021-06-07 | Disposition: A | Discharge status: Home / Self Care | Payer: Medicare Other | Source: Ambulatory Visit | Attending: Family Medicine | Admitting: Family Medicine

## 2021-06-07 DIAGNOSIS — R29810 Facial weakness: Secondary | ICD-10-CM

## 2021-06-07 DIAGNOSIS — R29898 Other symptoms and signs involving the musculoskeletal system: Secondary | ICD-10-CM

## 2021-06-07 DIAGNOSIS — R200 Anesthesia of skin: Secondary | ICD-10-CM

## 2021-06-07 MED ORDER — GADOBENATE DIMEGLUMINE 529 MG/ML IV SOLN
20.0000 mL | Freq: Once | INTRAVENOUS | Status: AC | PRN
  Administered 2021-06-07: 20 mL via INTRAVENOUS

## 2021-06-08 ENCOUNTER — Encounter: Payer: Self-pay | Admitting: Family Medicine

## 2021-06-09 NOTE — Addendum Note (Signed)
Addended by: Kem Boroughs D on: 06/09/2021 03:15 PM   Modules accepted: Orders

## 2021-06-10 NOTE — Telephone Encounter (Signed)
Labs faxed to [facility] at [phone]

## 2021-07-22 ENCOUNTER — Other Ambulatory Visit: Payer: Self-pay | Admitting: Family Medicine

## 2021-09-12 ENCOUNTER — Telehealth: Payer: Self-pay | Admitting: Family Medicine

## 2021-09-12 NOTE — Telephone Encounter (Signed)
Left message for patient to call back and schedule Medicare Annual Wellness Visit (AWV) in office.   If not able to come in office, please offer to do virtually or by telephone.  Left office number and my jabber [phone].  Last AWV:04/27/2016  Please schedule at anytime with Nurse Health Advisor.

## 2021-09-19 ENCOUNTER — Other Ambulatory Visit: Payer: Self-pay | Admitting: Family Medicine

## 2021-09-28 ENCOUNTER — Ambulatory Visit (INDEPENDENT_AMBULATORY_CARE_PROVIDER_SITE_OTHER): Payer: Medicare Other

## 2021-09-28 VITALS — Ht 74.0 in | Wt 247.0 lb

## 2021-09-28 DIAGNOSIS — Z0000 Encounter for general adult medical examination without abnormal findings: Secondary | ICD-10-CM

## 2021-09-28 DIAGNOSIS — Z1211 Encounter for screening for malignant neoplasm of colon: Secondary | ICD-10-CM | POA: Diagnosis not present

## 2021-09-28 NOTE — Progress Notes (Signed)
Subjective:   Gary Short. is a 50 y.o. male who presents for Medicare Annual/Subsequent preventive examination.  I connected with [place] today by telephone and verified that I am speaking with the correct person using two identifiers. Location patient: home Location provider: work Persons participating in the virtual visit: patient, Marine scientist.    I discussed the limitations, risks, security and privacy concerns of performing an evaluation and management service by telephone and the availability of in person appointments. I also discussed with the patient that there may be a patient responsible charge related to this service. The patient expressed understanding and verbally consented to this telephonic visit.    Interactive audio and video telecommunications were attempted between this provider and patient, however failed, due to patient having technical difficulties OR patient did not have access to video capability.  We continued and completed visit with audio only.  Some vital signs may be absent or patient reported.   Time Spent with patient on telephone encounter: 40 minutes    Review of Systems     Cardiac Risk Factors include: male gender;diabetes mellitus;dyslipidemia;obesity (BMI >30kg/m2);sedentary lifestyle     Objective:    Today's Vitals   09/28/21 1143  Weight: 247 lb (112 kg)  Height: 6\' 2"  (1.88 m)  PainSc: 8    Body mass index is 31.71 kg/m.  Advanced Directives 09/28/2021 05/07/2016  Does Patient Have a Medical Advance Directive? No No  Would patient like information on creating a medical advance directive? Yes (MAU/Ambulatory/Procedural Areas - Information given) Yes - Educational materials given    Current Medications (verified) Outpatient Encounter Medications as of 09/28/2021  Medication Sig   aspirin EC 81 MG tablet Take 1 tablet (81 mg total) by mouth daily.   buPROPion (WELLBUTRIN XL) 150 MG 24 hr tablet 150 mg po daily x 4 days and then 300 mg po  daily   glucose blood (BAYER CONTOUR TEST) test strip Check blood glucose once daily and as needed. Dx: E11.9   Insulin Pen Needle (RELION PEN NEEDLES) 32G X 4 MM MISC UAD to monitor blood glucose bid; Dx: Ell.69 & E66.9   Insulin Syringe-Needle U-100 (GLOBAL INSULIN SYRINGES) 30G X 5/16" 0.3 ML MISC Use twice a day with Novolin N   MICROLET LANCETS MISC Check blood sugar DX E11.9   sildenafil (REVATIO) 20 MG tablet TAKE 1 TO 5 TABLETS BY MOUTH ONCE DAILY AS NEEDED FOR ERECTILE DYSFUNCTION   atorvastatin (LIPITOR) 10 MG tablet Take 1 tablet (10 mg total) by mouth at bedtime. (Patient not taking: No sig reported)   doxycycline (VIBRA-TABS) 100 MG tablet Take 1 tablet (100 mg total) by mouth 2 (two) times daily. (Patient not taking: Reported on 09/28/2021)   glimepiride (AMARYL) 4 MG tablet Take 1 tablet (4 mg total) by mouth 2 (two) times daily. (Patient not taking: No sig reported)   nitroGLYCERIN (NITROSTAT) 0.4 MG SL tablet Place 1 tablet (0.4 mg total) under the tongue every 5 (five) minutes as needed for chest pain. (Patient not taking: No sig reported)   NOVOLIN N FLEXPEN RELION 100 UNIT/ML Kiwkpen INJECT 10 UNITS SUBCUTANEOUSLY TWICE DAILY (Patient not taking: Reported on 09/28/2021)   No facility-administered encounter medications on file as of 09/28/2021.    Allergies (verified) Iodine, Iodides, and Wasp venom   History: Past Medical History:  Diagnosis Date   Atypical chest pain 05/08/2017   Back pain 06/13/2010   Qualifier: Diagnosis of  By: Charlett Blake MD, Sonia Baller back at work  in 2002, was able to work until 2004. Ultimately had  Surgeries to his lower back twice and after complications with a spinal leak, was never able to return to work Radicular symptoms occur b/l.    Knee pain, bilateral 08/26/2014   Medicare annual wellness visit, subsequent 05/07/2016   Obesity, unspecified 06/13/2010   Qualifier: Diagnosis of  By: Charlett Blake MD, Erline Levine     Past Surgical History:  Procedure  Laterality Date   BACK SURGERY     X 2   KNEE SURGERY  1999   cartilage repair   Family History  Problem Relation Age of Onset   Arthritis Other        family hx of   Diabetes Other        family hx of   Hypertension Other        family hx of   Stroke Other        family hx of   Sudden death Other        family hx of   Stroke Father    Diabetes Father    Hyperlipidemia Father    Hypertension Father    Cancer Mother        breast   Diabetes Mother    Hyperlipidemia Mother    Hypertension Mother    Diabetes Sister    Arthritis Sister    Cancer Maternal Grandmother    Diabetes Sister    Social History   Socioeconomic History   Marital status: Married    Spouse name: Not on file   Number of children: Not on file   Years of education: Not on file   Highest education level: Not on file  Occupational History   Not on file  Tobacco Use   Smoking status: Every Day    Packs/day: 0.50    Types: Cigarettes   Smokeless tobacco: Never  Substance and Sexual Activity   Alcohol use: No   Drug use: No   Sexual activity: Not on file  Other Topics Concern   Not on file  Social History Narrative   Not on file   Social Determinants of Health   Financial Resource Strain: Medium Risk   Difficulty of Paying Living Expenses: Somewhat hard  Food Insecurity: Food Insecurity Present   Worried About Running Out of Food in the Last Year: Sometimes true   Ran Out of Food in the Last Year: Never true  Transportation Needs: No Transportation Needs   Lack of Transportation (Medical): No   Lack of Transportation (Non-Medical): No  Physical Activity: Inactive   Days of Exercise per Week: 0 days   Minutes of Exercise per Session: 0 min  Stress: Stress Concern Present   Feeling of Stress : To some extent  Social Connections: Moderately Isolated   Frequency of Communication with Friends and Family: More than three times a week   Frequency of Social Gatherings with Friends and Family:  More than three times a week   Attends Religious Services: Never   Marine scientist or Organizations: No   Attends Music therapist: Never   Marital Status: Married    Tobacco Counseling Ready to quit: Not Answered Counseling given: Not Answered   Clinical Intake:  Pre-visit preparation completed: Yes  Pain : 0-10 Pain Score: 8  Pain Type: Chronic pain Pain Location: Hip (and leg) Pain Onset: More than a month ago Pain Frequency: Constant     BMI - recorded: 31.71 Nutritional Status: BMI >  30  Obese Nutritional Risks: Unintentional weight loss (pt states he is not eating as much as he used to) Diabetes: Yes CBG done?: No Did pt. bring in CBG monitor from home?: No (phone visit)  How often do you need to have someone help you when you read instructions, pamphlets, or other written materials from your doctor or pharmacy?: 1 - Never  Diabetes:  Is the patient diabetic?  Yes  If diabetic, was a CBG obtained today?  No  Did the patient bring in their glucometer from home?  No phone visit How often do you monitor your CBG's? weekly.   Financial Strains and Diabetes Management:  Are you having any financial strains with the device, your supplies or your medication? No .  Does the patient want to be seen by Chronic Care Management for management of their diabetes?  No  Would the patient like to be referred to a Nutritionist or for Diabetic Management?  No   Diabetic Exams:  Diabetic Eye Exam: Completed 04/2021 per patient.   Diabetic Foot Exam: . Pt has been advised about the importance in completing this exam. To be completed by PCP   Interpreter Needed?: No  Information entered by :: Caroleen Hamman LPN   Activities of Daily Living In your present state of health, do you have any difficulty performing the following activities: 09/28/2021  Hearing? Y  Vision? N  Difficulty concentrating or making decisions? Y  Walking or climbing stairs? N   Dressing or bathing? N  Doing errands, shopping? N  Preparing Food and eating ? N  Using the Toilet? N  In the past six months, have you accidently leaked urine? N  Do you have problems with loss of bowel control? N  Managing your Medications? N  Managing your Finances? N  Housekeeping or managing your Housekeeping? N  Some recent data might be hidden    Patient Care Team: [name], MD as PCP - General  Indicate any recent Medical Services you may have received from other than [facility] providers in the past year (date may be approximate).     Assessment:   This is a routine wellness examination for [place].  Hearing/Vision screen Hearing Screening - Comments:: Has hearing aids but does not wear them Vision Screening - Comments:: Last eye exam-04/2021 Reading glasses  Dietary issues and exercise activities discussed: Current Exercise Habits: The patient does not participate in regular exercise at present, Exercise limited by: None identified   Goals Addressed             This Visit's Progress    Patient Stated       Continue to drink plenty of water        Depression Screen PHQ 2/9 Scores 09/28/2021 09/13/2020 05/14/2018  PHQ - 2 Score 1 0 0  PHQ- 9 Score - 0 -    Fall Risk Fall Risk  09/28/2021 05/14/2018  Falls in the past year? 1 No  Number falls in past yr: 1 -  Injury with Fall? 0 -  Risk for fall due to : History of fall(s) -  Follow up Falls prevention discussed -    FALL RISK PREVENTION PERTAINING TO THE HOME:  Any stairs in or around the home? Yes  If so, are there any without handrails? No  Home free of loose throw rugs in walkways, pet beds, electrical cords, etc? Yes  Adequate lighting in your home to reduce risk of falls? Yes   ASSISTIVE DEVICES UTILIZED  TO PREVENT FALLS:  Life alert? No  Use of a cane, walker or w/c? No  Grab bars in the bathroom? Yes  Shower chair or bench in shower? Yes  Elevated toilet seat or a handicapped toilet? No    TIMED UP AND GO:  Was the test performed? No . Phone visit   Cognitive Function:Normal cognitive status assessed by this Nurse Health Advisor. No abnormalities found.          Immunizations Immunization History  Administered Date(s) Administered   Influenza,inj,Quad PF,6+ Mos 12/24/2014, 10/26/2015, 11/02/2016   Pneumococcal Conjugate-13 04/27/2016   Pneumococcal Polysaccharide-23 05/14/2018   Tdap 12/24/2014    TDAP status: Up to date  Flu Vaccine status: Due, Education has been provided regarding the importance of this vaccine. Advised may receive this vaccine at local pharmacy or Health Dept. Aware to provide a copy of the vaccination record if obtained from local pharmacy or Health Dept. Verbalized acceptance and understanding.  Pneumococcal vaccine status: Up to date  Covid-19 vaccine status: Declined, Education has been provided regarding the importance of this vaccine but patient still declined. Advised may receive this vaccine at local pharmacy or Health Dept.or vaccine clinic. Aware to provide a copy of the vaccination record if obtained from local pharmacy or Health Dept. Verbalized acceptance and understanding.  Qualifies for Shingles Vaccine? Yes   Zostavax completed No   Shingrix Completed?: No.    Education has been provided regarding the importance of this vaccine. Patient has been advised to call insurance company to determine out of pocket expense if they have not yet received this vaccine. Advised may also receive vaccine at local pharmacy or Health Dept. Verbalized acceptance and understanding.  Screening Tests Health Maintenance  Topic Date Due   FOOT EXAM  Never done   OPHTHALMOLOGY EXAM  Never done   HIV Screening  Never done   Hepatitis C Screening  Never done   Zoster Vaccines- Shingrix (1 of 2) Never done   COLONOSCOPY (Pts 45-73yrs Insurance coverage will need to be confirmed)  Never done   URINE MICROALBUMIN  01/28/2020   HEMOGLOBIN A1C   12/22/2020   INFLUENZA VACCINE  06/27/2021   COVID-19 Vaccine (1) 10/14/2021 (Originally 03/19/1972)   Pneumococcal Vaccine 35-46 Years old (3 - PPSV23 if available, else PCV20) 05/15/2023   TETANUS/TDAP  12/24/2024   HPV VACCINES  Aged Out    Health Maintenance  Health Maintenance Due  Topic Date Due   FOOT EXAM  Never done   OPHTHALMOLOGY EXAM  Never done   HIV Screening  Never done   Hepatitis C Screening  Never done   Zoster Vaccines- Shingrix (1 of 2) Never done   COLONOSCOPY (Pts 45-38yrs Insurance coverage will need to be confirmed)  Never done   URINE MICROALBUMIN  01/28/2020   HEMOGLOBIN A1C  12/22/2020   INFLUENZA VACCINE  06/27/2021   Colorectal cancer screening: Cologuard ordered today  Lung Cancer Screening: (Low Dose CT Chest recommended if Age 32-80 years, 30 pack-year currently smoking OR have quit w/in 15years.) does not qualify.     Additional Screening:  Hepatitis C Screening: does not qualify  Vision Screening: Recommended annual ophthalmology exams for early detection of glaucoma and other disorders of the eye. Is the patient up to date with their annual eye exam?  Yes  Who is the provider or what is the name of the office in which the patient attends annual eye exams? Unsure of name   Dental Screening: Recommended annual dental  exams for proper oral hygiene  Community Resource Referral / Chronic Care Management: CRR required this visit?  Yes for food assistance  CCM required this visit?  No      Plan:     I have personally reviewed and noted the following in the patient's chart:   Medical and social history Use of alcohol, tobacco or illicit drugs  Current medications and supplements including opioid prescriptions. Patient is not currently taking opioid prescriptions. Functional ability and status Nutritional status Physical activity Advanced directives List of other physicians Hospitalizations, surgeries, and ER visits in previous 12  months Vitals Screenings to include cognitive, depression, and falls Referrals and appointments  In addition, I have reviewed and discussed with patient certain preventive protocols, quality metrics, and best practice recommendations. A written personalized care plan for preventive services as well as general preventive health recommendations were provided to patient.   Due to this being a telephonic visit, the after visit summary with patients personalized plan was offered to patient via mail or my-chart.  Patient would like to access on my-chart.   Marta Antu, LPN   [id number]  Nurse Health Advisor  Nurse Notes: None

## 2021-09-28 NOTE — Patient Instructions (Signed)
Gary Short , Thank you for taking time to complete your Medicare Wellness Visit. I appreciate your ongoing commitment to your health goals. Please review the following plan we discussed and let me know if I can assist you in the future.   Screening recommendations/referrals: Colonoscopy: Cologuard ordered today Recommended yearly ophthalmology/optometry visit for glaucoma screening and checkup Recommended yearly dental visit for hygiene and checkup  Vaccinations: Influenza vaccine: Due-May obtain vaccine at our office or your local pharmacy. Pneumococcal vaccine: Up to date Tdap vaccine: Up to date-Due-12/24/2024 Shingles vaccine: Discuss with pharmacy   Covid-19: Declined  Advanced directives: Information mailed today  Conditions/risks identified: See problem list  Next appointment: Follow up in one year for your annual wellness visit   Preventive Care 40-64 Years, Male Preventive care refers to lifestyle choices and visits with your health care provider that can promote health and wellness. What does preventive care include? A yearly physical exam. This is also called an annual well check. Dental exams once or twice a year. Routine eye exams. Ask your health care provider how often you should have your eyes checked. Personal lifestyle choices, including: Daily care of your teeth and gums. Regular physical activity. Eating a healthy diet. Avoiding tobacco and drug use. Limiting alcohol use. Practicing safe sex. Taking low-dose aspirin every day starting at age 41. What happens during an annual well check? The services and screenings done by your health care provider during your annual well check will depend on your age, overall health, lifestyle risk factors, and family history of disease. Counseling  Your health care provider may ask you questions about your: Alcohol use. Tobacco use. Drug use. Emotional well-being. Home and relationship well-being. Sexual  activity. Eating habits. Work and work Statistician. Screening  You may have the following tests or measurements: Height, weight, and BMI. Blood pressure. Lipid and cholesterol levels. These may be checked every 5 years, or more frequently if you are over 9 years old. Skin check. Lung cancer screening. You may have this screening every year starting at age 17 if you have a 30-pack-year history of smoking and currently smoke or have quit within the past 15 years. Fecal occult blood test (FOBT) of the stool. You may have this test every year starting at age 24. Flexible sigmoidoscopy or colonoscopy. You may have a sigmoidoscopy every 5 years or a colonoscopy every 10 years starting at age 87. Prostate cancer screening. Recommendations will vary depending on your family history and other risks. Hepatitis C blood test. Hepatitis B blood test. Sexually transmitted disease (STD) testing. Diabetes screening. This is done by checking your blood sugar (glucose) after you have not eaten for a while (fasting). You may have this done every 1-3 years. Discuss your test results, treatment options, and if necessary, the need for more tests with your health care provider. Vaccines  Your health care provider may recommend certain vaccines, such as: Influenza vaccine. This is recommended every year. Tetanus, diphtheria, and acellular pertussis (Tdap, Td) vaccine. You may need a Td booster every 10 years. Zoster vaccine. You may need this after age 36. Pneumococcal 13-valent conjugate (PCV13) vaccine. You may need this if you have certain conditions and have not been vaccinated. Pneumococcal polysaccharide (PPSV23) vaccine. You may need one or two doses if you smoke cigarettes or if you have certain conditions. Talk to your health care provider about which screenings and vaccines you need and how often you need them. This information is not intended to replace advice given to  you by your health care provider.  Make sure you discuss any questions you have with your health care provider. Document Released: 12/10/2015 Document Revised: 08/02/2016 Document Reviewed: 09/14/2015 Elsevier Interactive Patient Education  2017 [place] Prevention in the Home Falls can cause injuries. They can happen to people of all ages. There are many things you can do to make your home safe and to help prevent falls. What can I do on the outside of my home? Regularly fix the edges of walkways and driveways and fix any cracks. Remove anything that might make you trip as you walk through a door, such as a raised step or threshold. Trim any bushes or trees on the path to your home. Use bright outdoor lighting. Clear any walking paths of anything that might make someone trip, such as rocks or tools. Regularly check to see if handrails are loose or broken. Make sure that both sides of any steps have handrails. Any raised decks and porches should have guardrails on the edges. Have any leaves, snow, or ice cleared regularly. Use sand or salt on walking paths during winter. Clean up any spills in your garage right away. This includes oil or grease spills. What can I do in the bathroom? Use night lights. Install grab bars by the toilet and in the tub and shower. Do not use towel bars as grab bars. Use non-skid mats or decals in the tub or shower. If you need to sit down in the shower, use a plastic, non-slip stool. Keep the floor dry. Clean up any water that spills on the floor as soon as it happens. Remove soap buildup in the tub or shower regularly. Attach bath mats securely with double-sided non-slip rug tape. Do not have throw rugs and other things on the floor that can make you trip. What can I do in the bedroom? Use night lights. Make sure that you have a light by your bed that is easy to reach. Do not use any sheets or blankets that are too big for your bed. They should not hang down onto the floor. Have a  firm chair that has side arms. You can use this for support while you get dressed. Do not have throw rugs and other things on the floor that can make you trip. What can I do in the kitchen? Clean up any spills right away. Avoid walking on wet floors. Keep items that you use a lot in easy-to-reach places. If you need to reach something above you, use a strong step stool that has a grab bar. Keep electrical cords out of the way. Do not use floor polish or wax that makes floors slippery. If you must use wax, use non-skid floor wax. Do not have throw rugs and other things on the floor that can make you trip. What can I do with my stairs? Do not leave any items on the stairs. Make sure that there are handrails on both sides of the stairs and use them. Fix handrails that are broken or loose. Make sure that handrails are as long as the stairways. Check any carpeting to make sure that it is firmly attached to the stairs. Fix any carpet that is loose or worn. Avoid having throw rugs at the top or bottom of the stairs. If you do have throw rugs, attach them to the floor with carpet tape. Make sure that you have a light switch at the top of the stairs and the bottom of the stairs.  If you do not have them, ask someone to add them for you. What else can I do to help prevent falls? Wear shoes that: Do not have high heels. Have rubber bottoms. Are comfortable and fit you well. Are closed at the toe. Do not wear sandals. If you use a stepladder: Make sure that it is fully opened. Do not climb a closed stepladder. Make sure that both sides of the stepladder are locked into place. Ask someone to hold it for you, if possible. Clearly mark and make sure that you can see: Any grab bars or handrails. First and last steps. Where the edge of each step is. Use tools that help you move around (mobility aids) if they are needed. These include: Canes. Walkers. Scooters. Crutches. Turn on the lights when you  go into a dark area. Replace any light bulbs as soon as they burn out. Set up your furniture so you have a clear path. Avoid moving your furniture around. If any of your floors are uneven, fix them. If there are any pets around you, be aware of where they are. Review your medicines with your doctor. Some medicines can make you feel dizzy. This can increase your chance of falling. Ask your doctor what other things that you can do to help prevent falls. This information is not intended to replace advice given to you by your health care provider. Make sure you discuss any questions you have with your health care provider. Document Released: 09/09/2009 Document Revised: 04/20/2016 Document Reviewed: 12/18/2014 Elsevier Interactive Patient Education  2017 [employer].

## 2021-10-05 ENCOUNTER — Telehealth: Payer: Self-pay

## 2021-10-05 NOTE — Telephone Encounter (Signed)
   Telephone encounter was:  Unsuccessful.  10/05/2021 Name: Gary Short. MRN: [id number] DOB: 1971/11/09  Unsuccessful outbound call made today to assist with:  Food Insecurity  Outreach Attempt:  1st Attempt  A HIPAA compliant voice message was left requesting a return call.  Instructed patient to call back at [phone].  Marionna Gonia, AAS Paralegal, [facility]  [address], [place] [address] ??[email or web address]  ?? [phone]   [email or web address]

## 2021-10-26 ENCOUNTER — Telehealth: Payer: Self-pay

## 2021-10-26 NOTE — Telephone Encounter (Signed)
   Telephone encounter was:  Unsuccessful.  10/26/2021 Name: Gary Short. MRN: [id number] DOB: [birth year]  Unsuccessful outbound call made today to assist with:  Food Insecurity  Outreach Attempt:  2nd Attempt  A HIPAA compliant voice message was left requesting a return call.  Instructed patient to call back at [phone].  Malory Spurr, AAS Paralegal, [facility]  [address], [place] [address] ??[email or web address]  ?? [phone]   [email or web address]

## 2021-10-28 ENCOUNTER — Telehealth: Payer: Self-pay

## 2021-10-28 NOTE — Telephone Encounter (Signed)
.    Telephone encounter was:  Unsuccessful.  10/28/2021 Name: Imagene Gurney. MRN: [id number] DOB: 01-18-1971  Unsuccessful outbound call made today to assist with:  Food Insecurity  Outreach Attempt:  3rd Attempt.  Referral closed unable to contact patient.  Unable to leave message voicemail does not pick-up.  Taci Sterling, AAS Paralegal, [facility]  [address], [place] [address] ??[email or web address]  ?? [phone]   [email or web address]

## 2021-11-08 ENCOUNTER — Other Ambulatory Visit: Payer: Self-pay | Admitting: Family Medicine

## 2022-03-07 ENCOUNTER — Other Ambulatory Visit: Payer: Self-pay | Admitting: Family Medicine

## 2022-04-26 ENCOUNTER — Other Ambulatory Visit: Payer: Self-pay | Admitting: Family Medicine

## 2022-04-27 NOTE — Telephone Encounter (Signed)
Pt scheduled  

## 2022-06-15 ENCOUNTER — Other Ambulatory Visit: Payer: Self-pay | Admitting: Family Medicine

## 2022-07-26 ENCOUNTER — Other Ambulatory Visit: Payer: Self-pay | Admitting: Family Medicine

## 2022-10-17 ENCOUNTER — Ambulatory Visit (INDEPENDENT_AMBULATORY_CARE_PROVIDER_SITE_OTHER): Payer: Medicare Other | Admitting: Family Medicine

## 2022-10-17 DIAGNOSIS — Z0000 Encounter for general adult medical examination without abnormal findings: Secondary | ICD-10-CM | POA: Diagnosis not present

## 2022-10-17 DIAGNOSIS — Z720 Tobacco use: Secondary | ICD-10-CM | POA: Diagnosis not present

## 2022-10-17 DIAGNOSIS — Z1211 Encounter for screening for malignant neoplasm of colon: Secondary | ICD-10-CM

## 2022-10-17 NOTE — Progress Notes (Signed)
PATIENT CHECK-IN and HEALTH RISK ASSESSMENT QUESTIONNAIRE:  -completed by phone/video for upcoming Medicare Preventive Visit  Pre-Visit Check-in: 1)Vitals (height, wt, BP, etc) - record in vitals section for visit on day of visit 2)Review and Update Medications, Allergies PMH, Surgeries, Social history in Epic 3)Hospitalizations in the last year with date/reason?  No  4)Review and Update Care Team (patient's specialists) in Epic 5) Complete PHQ9 in Epic  6) Complete Fall Screening in Epic 7)Review all Health Maintenance Due and order under PCP if not done.  Medicare Wellness Patient Questionnaire:  Answer theses question about your habits: Do you drink alcohol? Rarely How many drinks do you have a day? Rarely Have you ever smoked? Yes Have you stopped smoking and date if applicable? N/a  How many packs a day do you smoke? 5-6 cigs/day, interested in quitting Do you use smokeless tobacco? No Do you use an illicit drugs? No Do you exercises? No - but is active, 6 kids and 4 grandkids he stay busy playing with them outside - gets over 150 minutes per week playing outside Are you sexually active? Yes Number of partners?1 What did you eat for breakfast today (or yesterday)? Eggs, sausage, pancakes Typical breakfast Eggs, meat, pancakes What did you eat for lunch today (or yesterday)?  Ribs Typical lunch normally cooks at home (chicken, pork chop, etc) What did you eat for diner today (or yesterday)? Did not eat dinner Typical dinner "full course meal", eats veggies with dinner, can't smell or taste - from covid Typical snacks: does not snack a lot, fresh fruit sometimes What beverages do you drink besides water: None  Answer theses question about you: Can you perform most household chores? Yes Do you find it hard to follow a conversation in a noisy room? 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 Yes Do you often ask people to speak up or repeat themselves? Yes Do you experience ringing in your ears? Sometimes Do you have difficulty understanding a soft or whispered voice? Yes Do you feel that you have a problem with memory? Yes Do you often misplace items? Sometimes, but rare, feels mind is fine Do you balance your checkbook and or bank acounts? No Do you feel safe at home? Yes Last dentist visit? "Few years ago" Do you need assistance with any of the following:  Driving? No  Feeding yourself? No  Getting from bed to chair? A little trouble due to arthritis issues  Getting to the toilet? No  Bathing or showering? No  Dressing yourself? No  Managing money? No  Climbing a flight of stairs? Depends on how many stairs   Preparing meals? No  Do you have Advanced Directives in place (Living Will, Healthcare Power or Attorney)? No   Last eye Exam and location? 2 years ago   Do you currently use prescribed or non-prescribed narcotic or opioid pain medications? Yes, uses these rarely  Do you have a history or close family history of breast, ovarian, tubal or peritoneal cancer or a family member with BRCA (breast cancer susceptibility 1 and 2) gene mutations? Mom with Breast cancer Nurse/Assistant Credentials/time stamp: Elza Rafter [id number]  ----------------------------------------------------------------------------------------------------------------------------------------------------------------------------------------------------------------------    MEDICARE ANNUAL PREVENTIVE CARE VISIT WITH PROVIDER (Welcome  to Medicare, initial annual wellness or annual wellness exam)  Virtual Visit via Video Note  I connected with Gary Short on 10/17/22 by a video enabled telemedicine application and verified that I am speaking with the correct  person using two identifiers.  Location patient: home Location provider:work or home office Persons participating in the virtual visit: patient, provider  Concerns and/or follow up today: see above, otherwise doing ok, some increased stressed lately - lost best friend a week ago.    See HM section in Epic for other details of completed HM.    ROS: negative for report of fevers, unintentional weight loss, vision changes, vision loss, hearing loss or change, chest pain, sob, hemoptysis, melena, hematochezia, hematuria, genital discharge or lesions, falls, bleeding or bruising, loc, thoughts of suicide or self harm, memory loss  Patient-completed extensive health risk assessment - reviewed and discussed with the patient: See Health Risk Assessment completed with patient prior to the visit either above or in recent phone note. This was reviewed in detailed with the patient today and appropriate recommendations, orders and referrals were placed as needed per Summary below and patient instructions.   Review of Medical History: -PMH, [place], Family History and current specialty and care providers reviewed and updated and listed below   Patient Care Team: [name], MD as PCP - General   Past Medical History:  Diagnosis Date   Atypical chest pain 05/08/2017   Back pain 06/13/2010   Qualifier: Diagnosis of  By: Charlett Blake MD, Sonia Baller back at work in 2002, was able to work until 2004. Ultimately had  Surgeries to his lower back twice and after complications with a spinal leak, was never able to return to work Radicular symptoms occur b/l.    Knee pain, bilateral 08/26/2014   Medicare annual wellness visit, subsequent 05/07/2016   Obesity, unspecified 06/13/2010   Qualifier: Diagnosis of  By: Charlett Blake MD, Erline Levine      Past Surgical History:  Procedure Laterality Date   BACK SURGERY     X 2   KNEE SURGERY  1999   cartilage repair    Social History   Socioeconomic History   Marital status:  Married    Spouse name: Not on file   Number of children: Not on file   Years of education: Not on file   Highest education level: Not on file  Occupational History   Not on file  Tobacco Use   Smoking status: Every Day    Packs/day: 0.50    Types: Cigarettes   Smokeless tobacco: Never  Substance and Sexual Activity   Alcohol use: No   Drug use: No   Sexual activity: Not on file  Other Topics Concern   Not on file  Social History Narrative   Not on file   Social Determinants of Health   Financial Resource Strain: Medium Risk (09/28/2021)   Overall Financial Resource Strain (CARDIA)    Difficulty of Paying Living Expenses: Somewhat hard  Food Insecurity: Food Insecurity Present (09/28/2021)   Hunger Vital Sign    Worried About Running Out of Food in the Last Year: Sometimes true    Ran Out of Food in the Last Year: Never true  Transportation Needs: No Transportation Needs (09/28/2021)   PRAPARE - Hydrologist (Medical): No    Lack of Transportation (Non-Medical): No  Physical Activity: Inactive (09/28/2021)   Exercise Vital Sign    Days of Exercise per Week: 0 days  Minutes of Exercise per Session: 0 min  Stress: Stress Concern Present (09/28/2021)   [place]    Feeling of Stress : To some extent  Social Connections: Moderately Isolated (09/28/2021)   Social Connection and Isolation Panel [NHANES]    Frequency of Communication with Friends and Family: More than three times a week    Frequency of Social Gatherings with Friends and Family: More than three times a week    Attends Religious Services: Never    Marine scientist or Organizations: No    Attends Archivist Meetings: Never    Marital Status: Married  Human resources officer Violence: Not At Risk (09/28/2021)   Humiliation, Afraid, Rape, and Kick questionnaire    Fear of Current or Ex-Partner: No    Emotionally  Abused: No    Physically Abused: No    Sexually Abused: No    Family History  Problem Relation Age of Onset   Arthritis Other        family hx of   Diabetes Other        family hx of   Hypertension Other        family hx of   Stroke Other        family hx of   Sudden death Other        family hx of   Stroke Father    Diabetes Father    Hyperlipidemia Father    Hypertension Father    Cancer Mother        breast   Diabetes Mother    Hyperlipidemia Mother    Hypertension Mother    Diabetes Sister    Arthritis Sister    Cancer Maternal Grandmother    Diabetes Sister     Current Outpatient Medications on File Prior to Visit  Medication Sig Dispense Refill   aspirin EC 81 MG tablet Take 1 tablet (81 mg total) by mouth daily.     glucose blood (BAYER CONTOUR TEST) test strip Check blood glucose once daily and as needed. Dx: E11.9 100 each 5   Insulin Pen Needle (RELION PEN NEEDLES) 32G X 4 MM MISC UAD to monitor blood glucose bid; Dx: Ell.69 & E66.9 100 each PRN   Insulin Syringe-Needle U-100 (GLOBAL INSULIN SYRINGES) 30G X 5/16" 0.3 ML MISC Use twice a day with Novolin N 100 each 5   MICROLET LANCETS MISC Check blood sugar DX E11.9 100 each 5   nitroGLYCERIN (NITROSTAT) 0.4 MG SL tablet Place 1 tablet (0.4 mg total) under the tongue every 5 (five) minutes as needed for chest pain. 25 tablet 1   sildenafil (REVATIO) 20 MG tablet TAKE 1 TO 5 TABLETS BY MOUTH ONCE DAILY AS NEEDED FOR ERECTILE DYSFUNCTION 30 tablet 3   atorvastatin (LIPITOR) 10 MG tablet Take 1 tablet (10 mg total) by mouth at bedtime. (Patient not taking: Reported on 09/13/2020) 30 tablet 3   buPROPion (WELLBUTRIN XL) 150 MG 24 hr tablet 150 mg po daily x 4 days and then 300 mg po daily (Patient not taking: Reported on 10/17/2022) 60 tablet 3   glimepiride (AMARYL) 4 MG tablet Take 1 tablet (4 mg total) by mouth 2 (two) times daily. (Patient not taking: Reported on 09/13/2020) 60 tablet 5    HYDROcodone-acetaminophen (NORCO) 7.5-325 MG tablet Take 1-2 tablets by mouth every 6 (six) hours as needed.     NOVOLIN N FLEXPEN RELION 100 UNIT/ML Kiwkpen INJECT 10 UNITS SUBCUTANEOUSLY TWICE  DAILY (Patient not taking: Reported on 09/28/2021) 15 mL 0   No current facility-administered medications on file prior to visit.    Allergies  Allergen Reactions   Iodine Shortness Of Breath    Per pt "I felt like I was going to die"   Iodides    Wasp Venom Swelling       Physical Exam There were no vitals filed for this visit. Estimated body mass index is 31.71 kg/m as calculated from the following:   Height as of 09/28/21: _0  (1.88 m).   Weight as of 09/28/21: 247 lb (112 kg).  EKG (optional): deferred due to virtual visit  GENERAL: alert, oriented, appears well and in no acute distress; visual acuity grossly intact, full vision exam deferred due to pandemic and/or virtual encounter  HEENT: atraumatic, conjunttiva clear, no obvious abnormalities on inspection of external nose and ears  NECK: normal movements of the head and neck  LUNGS: on inspection no signs of respiratory distress, breathing rate appears normal, no obvious gross SOB, gasping or wheezing  CV: no obvious cyanosis  MS: moves all visible extremities without noticeable abnormality  PSYCH/NEURO: pleasant and cooperative, no obvious depression or anxiety, speech and thought processing grossly intact, Cognitive function grossly intact  [facility] Office Visit from 10/17/2022 in [facility] at [facility]  PHQ-9 Total Score 12           10/17/2022   10:39 AM 09/28/2021   12:00 PM 09/13/2020    3:06 PM 05/14/2018    1:21 PM  Depression screen PHQ 2/9  Decreased Interest 3 0 0 0  Down, Depressed, Hopeless 3 1 0 0  PHQ - 2 Score 6 1 0 0  Altered sleeping 3  0   Tired, decreased energy 3  0   Change in appetite 0  0   Feeling bad or failure about yourself  0  0   Trouble concentrating 0  0   Moving  slowly or fidgety/restless 0  0   Suicidal thoughts 0  0   PHQ-9 Score 12  0   Difficult doing work/chores Somewhat difficult         05/14/2018    1:21 PM 09/28/2021   11:59 AM  Fall Risk  Falls in the past year? No 1  Was there an injury with Fall?  0  Fall Risk Category Calculator  2  Fall Risk Category  Moderate  Patient Fall Risk Level  Moderate fall risk  Patient at Risk for Falls Due to  History of fall(s)  Fall risk Follow up  Falls prevention discussed     SUMMARY AND PLAN:  Colon cancer screening - Plan: Ambulatory referral to Gastroenterology  Medicare annual wellness visit, subsequent  Tobacco use   All appropriate health maintenance/preventive measures for age/risks discussed and when due are noted below. The patient was referred if needed and if the patient was agreeable.  Vaccines: he declines flu and covid vaccines. He is considering getting the shingles vaccine and will do at the pharmacy if he decides to do. Provided info/counseling.   Colorectal cancer screening: discussed options and he prefers referral for colonoscopy - placed.  Reports seeing PCP in person in a few weeks and will do any lab tests then, advised: Cardiovascular screening blood tests Diabetes screening tests  Hepatitis C screening    Education and counseling on the following was provided based on the above review of health and a plan/checklist for the patient, along with additional  information discussed, was provided for the patient in the patient instructions :  -Advised on importance of and resources for completing advanced directives - provided more info in patient instructions -Provided counseling and plan for difficulty hearing - he has seen specialist and has hearing aides -Provided counseling and plan for increased risk of falling if applicable per above screening. Advised balance exercises. Outlined in patient instructions.  -Provided counseling and plan for function difficulties/  difficulties with ADLs if applicable per above screening. -Advised and counseled on maintaining healthy weight and healthy lifestyle - including the importance of a health diet, regular physical activity, social connections and stress management. -Advised and counseled on a whole foods based healthy diet and regular exercise: discussed a heart healthy whole foods based diet at length. A summary of a healthy diet was provided in the Patient Instructions. -Advised yearly dental visits at minimum and regular eye exams -Advised and counseled on tobacco, opoid use/misuse. Uses opiod rarely. In contemplation phase for quitting tobacco Counseled and offered help. He was not aware of nicotine replacement options and he and his wife seem interested in possibly try theses. Advised picking a quit date, preparation and follow up.   Follow up: see patient instructions   There are no Patient Instructions on file for this visit.  Lucretia Kern, DO

## 2022-10-17 NOTE — Patient Instructions (Addendum)
IT was such a pleasure talking with you today. I really enjoy talking to folks who are motivated and interested in getting healthy! I wish you and your wife all the best in trying to give up the smoking - it is not easy, but I believe you can.   CHECKLIST FOR A HEALTHY LIFE:  -I am so glad you are interested in quitting smoking. This will go a long way in helping you with your goal to live a long and healthy life. Some steps you can take:  -pick a quit day  -call the quit line for free help/coaching: 1-800-QUIT-NOW -get prepared: purchase '2mg'$  nicotine gum and lozenges and get rid of all cigarettes prior to your quit day -know that the first 14 days from quitting will be the toughest, hang in there and things will get better -know that it is ok if you don't succeed the first time you quit, you can try again. -when you have a craving: drink a few sips of water slowly, do another activity and use the nicotene gum or lozenge -follow up with your coach, me or your doctor about 3-7 days after your quit date and then on a regular basis for the next 6 months   -Eat a healthy whole foods based diet, get regular physical activity, manage stress and engage in social connections - see below for specific suggestions and more information.  -Vaccines due:  -can get the shingles vaccine at most pharmacies or with your doctor  -I noted in your chart the preference to not get the flu and covid vaccines, if you cahnge your mind theses are available at most pharmacies.  -Tests and screenings due:  -Please inquire about the following test at your inperson visit:   Cardiovascular screening blood tests Diabetes screening tests  Hepatitis C screening    -Referrals:  -I sent the referral for the colonosocpy to the Gastroenterology office per your request. If you do not hear about this appointment in the next 1 week, please contact your primary care office to check on the status of this referral. Thanks!  -See  a dentist at least yearly  -Get your eyes checked   -Other issues addressed today: -healthy whole food plant heavy diet (see below for details) -goal for exercise is 150 minutes per week of aerobic exercise, 2 days per week of strength or resistance exercise and 2-3+ days per week of balance exercises -balance exercises: make sure to do where you have something sturdy to hold onto to avoid fall. Start slow and gradually work up to goal below:  1) 20 x up on toes and back down    2) 20x each side lifting leg slightly of floor to the side    3) stand on one foot, goal of 20 seconds on each side      -Follow up: -yearly for annual wellness visit with primary care office    FOOD - THE FUEL FOR A HAPPY HEALTHY LIFE: -eat real food: lots of colorful vegetables (half the plate) -consume on a regular basis: whole grains (make sure first ingredient on label contains the word "whole"), fresh fruits, fish, nuts, seeds, healthy oils (such as olive oil, grapeseed oil, avocado oil) -may eat small amounts of dairy and lean meat on occasion, but avoid processed meats such as ham, bacon, lunch meat, etc. -drink water -try to avoid fast food and pre-packaged foods, processed meat -try to avoid foods that contain any ingredients with names you do not recognize  -  try to avoid sugar/sweets (except for the natural sugar that occurs in fresh fruit) -try to avoid sweet drinks -try to avoid white rice, white bread, pasta (unless whole grain), white or yellow potatoes  MOVE - the key to keeping your body moving and working best: -gradually increase intentional physical activity -move and stretch your body, legs, feet and arms when sitting for long periods -try to  get at least 150 minutes per week, 20-30 minutes of sustained activity or two 10 minute episodes of sustained activity every day. If you need assistance with exercise you could consider the following community options:  -Silver sneakers https://tools.silversneakers.com  -Walk with a Doc: http://stephens-thompson.biz/  -try to include resistance (weight lifting/strength building) and balance exercises twice per week: or the following link for ideas: ChessContest.fr  UpdateClothing.com.cy  STRESS MANAGEMENT - so important for health and well being -try meditating, or just sitting quietly with deep breathing while intentionally relaxing all parts of your body for 5 minutes daily  SOCIAL CONNECTIONS: -options in [place] if you wish to engage in more social and exercise related activities:  -Silver sneakers https://tools.silversneakers.com  -Walk with a Doc: http://stephens-thompson.biz/  -Check out the [place] 50+ section on the [place] [employer] (hiking clubs, book clubs, cards and games, chess, exercise classes, aquatic classes and much more) - see the website for details: https://www.[place].gov/departments/parks-recreation/active-adults50  -YouTube has lots of exercise videos for different ages and abilities as well  -[place] (a variety of indoor and outdoor inperson activities for adults). 505-513-1364. [address].  -Virtual Online Classes (a variety of topics): see seniorplanet.org or call [phone]  -consider volunteering at a school, hospice center, church, senior center or elsewhere   ADVANCED HEALTHCARE DIRECTIVES:  Everyone should have advanced health care directives in place. This is so that you get the care you want, should you ever be in a situation where you are unable to make your own medical decisions.   From the   Advanced Directive Website: "[place] are legal documents in which you give written instructions about your health care if, in the future, you cannot speak for yourself.   A health care power of attorney allows you to name a person you trust to make your health care decisions if you cannot make them yourself. A declaration of a desire for a natural death (or living will) is document, which states that you desire not to have your life prolonged by extraordinary measures if you have a terminal or incurable illness or if you are in a vegetative state. An advance instruction for mental health treatment makes a declaration of instructions, information and preferences regarding your mental health treatment. It also states that you are aware that the advance instruction authorizes a mental health treatment provider to act according to your wishes. It may also outline your consent or refusal of mental health treatment. A declaration of an anatomical gift allows anyone over the age of 77 to make a gift by will, organ donor card or other document."   Please see the following website or an elder law attorney for forms, FAQs and for completion of advanced directives: [place] Secretary of [place] ([email or web address].no)  Or copy and paste the following to your web browser: PokerReunion.[email or web address].cy

## 2022-10-29 NOTE — Assessment & Plan Note (Addendum)
hgba1c acceptable, minimize simple carbs. Increase exercise as tolerated. Continue current meds patient not taking Glimepiride. Consider flu, covid boosters. Shingrix is the new shingles shot, 2 shots over 2-6 months, confirm coverage with insurance and document, then can return here for shots with nurse appt or at pharmacy. He has not been checking his sugars or taking his meds.  Needs Colonoscopy

## 2022-10-29 NOTE — Assessment & Plan Note (Signed)
Encouraged complete cessation. Discussed need to quit as relates to risk of numerous cancers, cardiac and pulmonary disease as well as neurologic complications. Counseled for greater than 3 minutes. Has dropped from 2 ppd to 1/2 ppd he is encouraged to try and stop altogether.

## 2022-10-29 NOTE — Assessment & Plan Note (Signed)
Patient not taking his Wellbutrin

## 2022-10-29 NOTE — Assessment & Plan Note (Signed)
Encourage heart healthy diet such as MIND or DASH diet, increase exercise, avoid trans fats, simple carbohydrates and processed foods, consider a krill or fish or flaxseed oil cap daily.  Patient not taking Atorvastatin

## 2022-10-29 NOTE — Assessment & Plan Note (Signed)
Struggles with chronic pain but manages without medication most days. Allowed refill on Hydrocodone which he takes infrequently

## 2022-10-30 ENCOUNTER — Ambulatory Visit (HOSPITAL_BASED_OUTPATIENT_CLINIC_OR_DEPARTMENT_OTHER)
Admission: RE | Admit: 2022-10-30 | Discharge: 2022-10-30 | Disposition: A | Discharge status: Home / Self Care | Payer: Medicare Other | Source: Ambulatory Visit | Attending: Family Medicine | Admitting: Family Medicine

## 2022-10-30 ENCOUNTER — Encounter: Payer: Self-pay | Admitting: Family Medicine

## 2022-10-30 ENCOUNTER — Ambulatory Visit (INDEPENDENT_AMBULATORY_CARE_PROVIDER_SITE_OTHER): Payer: Medicare Other | Admitting: Family Medicine

## 2022-10-30 VITALS — BP 126/80 | HR 73 | Temp 97.9°F | Resp 16 | Ht 72.0 in | Wt 271.8 lb

## 2022-10-30 DIAGNOSIS — E669 Obesity, unspecified: Secondary | ICD-10-CM

## 2022-10-30 DIAGNOSIS — Z720 Tobacco use: Secondary | ICD-10-CM

## 2022-10-30 DIAGNOSIS — F418 Other specified anxiety disorders: Secondary | ICD-10-CM | POA: Diagnosis not present

## 2022-10-30 DIAGNOSIS — M549 Dorsalgia, unspecified: Secondary | ICD-10-CM

## 2022-10-30 DIAGNOSIS — R0789 Other chest pain: Secondary | ICD-10-CM | POA: Diagnosis not present

## 2022-10-30 DIAGNOSIS — M25551 Pain in right hip: Secondary | ICD-10-CM

## 2022-10-30 DIAGNOSIS — Z23 Encounter for immunization: Secondary | ICD-10-CM

## 2022-10-30 DIAGNOSIS — Z1211 Encounter for screening for malignant neoplasm of colon: Secondary | ICD-10-CM

## 2022-10-30 DIAGNOSIS — E1169 Type 2 diabetes mellitus with other specified complication: Secondary | ICD-10-CM | POA: Diagnosis not present

## 2022-10-30 DIAGNOSIS — R002 Palpitations: Secondary | ICD-10-CM | POA: Diagnosis not present

## 2022-10-30 DIAGNOSIS — E782 Mixed hyperlipidemia: Secondary | ICD-10-CM

## 2022-10-30 DIAGNOSIS — R351 Nocturia: Secondary | ICD-10-CM

## 2022-10-30 MED ORDER — NITROGLYCERIN 0.4 MG SL SUBL: 0.4000 mg | SUBLINGUAL_TABLET | SUBLINGUAL | 1 refills | Status: AC | PRN

## 2022-10-30 MED ORDER — HYDROCODONE-ACETAMINOPHEN 7.5-325 MG PO TABS: 1.0000 | ORAL_TABLET | Freq: Four times a day (QID) | ORAL | 0 refills | Status: AC | PRN

## 2022-10-30 MED ORDER — EPINEPHRINE 0.3 MG/0.3ML IJ SOAJ: 0.3000 mg | INTRAMUSCULAR | 2 refills | Status: DC | PRN

## 2022-10-30 NOTE — Assessment & Plan Note (Signed)
Referred to cardiology for evaluation due to intermittent chest tightness with some associated SOB. He is given a refill on NTG to use prn and he will seek care if pain returns and does not resolve.

## 2022-10-30 NOTE — Progress Notes (Signed)
Subjective:    Patient ID: Gary Gurney., male    DOB: Mar 28, 1971, 51 y.o.   MRN: [id number]  Chief Complaint  Patient presents with   Annual Exam    Annual Exam     HPI Patient is in today for follow up on chronic medical concerns. No recent febrile illness or acute hospitalizations. His biggest concern is chronic mpain he notes back pain, myalgias, left knee, right shoulder and right hip pain are the worse. He has given up sodas but is still smoking 1/2 ppd. Denies HA/congestion/fevers/GI or GU c/o. Taking meds as prescribed. He has intermittent chest pressure with sob and some palpitations.   Past Medical History:  Diagnosis Date   Atypical chest pain 05/08/2017   Back pain 06/13/2010   Qualifier: Diagnosis of  By: Charlett Blake MD, Sonia Baller back at work in 2002, was able to work until 2004. Ultimately had  Surgeries to his lower back twice and after complications with a spinal leak, was never able to return to work Radicular symptoms occur b/l.    Knee pain, bilateral 08/26/2014   Medicare annual wellness visit, subsequent 05/07/2016   Obesity, unspecified 06/13/2010   Qualifier: Diagnosis of  By: Charlett Blake MD, Erline Levine      Past Surgical History:  Procedure Laterality Date   BACK SURGERY     X 2   KNEE SURGERY  1999   cartilage repair    Family History  Problem Relation Age of Onset   Cancer Mother        breast   Diabetes Mother    Hyperlipidemia Mother    Hypertension Mother    Stroke Father    Diabetes Father    Hyperlipidemia Father    Hypertension Father    Diabetes Sister    Arthritis Sister    Diabetes Sister    Cancer Maternal Uncle        throat, stomach   Cancer Maternal Grandmother    Arthritis Other        family hx of   Diabetes Other        family hx of   Hypertension Other        family hx of   Stroke Other        family hx of   Sudden death Other        family hx of    Social History   Socioeconomic History   Marital status: Married     Spouse name: Not on file   Number of children: Not on file   Years of education: Not on file   Highest education level: Not on file  Occupational History   Not on file  Tobacco Use   Smoking status: Every Day    Packs/day: 0.50    Types: Cigarettes   Smokeless tobacco: Never  Substance and Sexual Activity   Alcohol use: No   Drug use: No   Sexual activity: Not on file  Other Topics Concern   Not on file  Social History Narrative   Not on file   Social Determinants of Health   Financial Resource Strain: Medium Risk (09/28/2021)   Overall Financial Resource Strain (CARDIA)    Difficulty of Paying Living Expenses: Somewhat hard  Food Insecurity: Food Insecurity Present (09/28/2021)   Hunger Vital Sign    Worried About Running Out of Food in the Last Year: Sometimes true    Ran Out of Food in the Last Year: Never true  Transportation Needs: No Transportation Needs (09/28/2021)   PRAPARE - Hydrologist (Medical): No    Lack of Transportation (Non-Medical): No  Physical Activity: Inactive (09/28/2021)   Exercise Vital Sign    Days of Exercise per Week: 0 days    Minutes of Exercise per Session: 0 min  Stress: Stress Concern Present (09/28/2021)   [place]aval[place]    Feeling of Stress : To some extent  Social Connections: Moderately Isolated (09/28/2021)   Social Connection and Isolation Panel [NHANES]    Frequency of Communication with Friends and Family: More than three times a week    Frequency of Social Gatherings with Friends and Family: More than three times a week    Attends Religious Services: Never    Marine scientist or Organizations: No    Attends Archivist Meetings: Never    Marital Status: Married  Human resources officer Violence: Not At Risk (09/28/2021)   Humiliation, Afraid, Rape, and Kick questionnaire    Fear of Current or Ex-Partner: No    Emotionally Abused: No     Physically Abused: No    Sexually Abused: No    Outpatient Medications Prior to Visit  Medication Sig Dispense Refill   aspirin EC 81 MG tablet Take 1 tablet (81 mg total) by mouth daily.     atorvastatin (LIPITOR) 10 MG tablet Take 1 tablet (10 mg total) by mouth at bedtime. 30 tablet 3   buPROPion (WELLBUTRIN XL) 150 MG 24 hr tablet 150 mg po daily x 4 days and then 300 mg po daily 60 tablet 3   glimepiride (AMARYL) 4 MG tablet Take 1 tablet (4 mg total) by mouth 2 (two) times daily. 60 tablet 5   glucose blood (BAYER CONTOUR TEST) test strip Check blood glucose once daily and as needed. Dx: E11.9 100 each 5   Insulin Pen Needle (RELION PEN NEEDLES) 32G X 4 MM MISC UAD to monitor blood glucose bid; Dx: Ell.69 & E66.9 100 each PRN   Insulin Syringe-Needle U-100 (GLOBAL INSULIN SYRINGES) 30G X 5/16" 0.3 ML MISC Use twice a day with Novolin N 100 each 5   MICROLET LANCETS MISC Check blood sugar DX E11.9 100 each 5   NOVOLIN N FLEXPEN RELION 100 UNIT/ML Kiwkpen INJECT 10 UNITS SUBCUTANEOUSLY TWICE DAILY 15 mL 0   sildenafil (REVATIO) 20 MG tablet TAKE 1 TO 5 TABLETS BY MOUTH ONCE DAILY AS NEEDED FOR ERECTILE DYSFUNCTION 30 tablet 3   HYDROcodone-acetaminophen (NORCO) 7.5-325 MG tablet Take 1-2 tablets by mouth every 6 (six) hours as needed.     nitroGLYCERIN (NITROSTAT) 0.4 MG SL tablet Place 1 tablet (0.4 mg total) under the tongue every 5 (five) minutes as needed for chest pain. 25 tablet 1   No facility-administered medications prior to visit.    Allergies  Allergen Reactions   Iodine Shortness Of Breath    Per pt "I felt like I was going to die"   Wasp Venom Swelling    syncope   Iodides     Review of Systems  Constitutional:  Positive for malaise/fatigue. Negative for chills and fever.  HENT:  Negative for congestion and hearing loss.   Eyes:  Negative for discharge.  Respiratory:  Positive for shortness of breath. Negative for cough and sputum production.   Cardiovascular:   Positive for chest pain and palpitations. Negative for leg swelling.  Gastrointestinal:  Negative for abdominal pain, blood in  stool, constipation, diarrhea, heartburn, nausea and vomiting.  Genitourinary:  Negative for dysuria, frequency, hematuria and urgency.  Musculoskeletal:  Positive for back pain and joint pain. Negative for falls and myalgias.  Skin:  Negative for rash.  Neurological:  Negative for dizziness, sensory change, loss of consciousness, weakness and headaches.  Endo/Heme/Allergies:  Negative for environmental allergies. Does not bruise/bleed easily.  Psychiatric/Behavioral:  Positive for depression. Negative for suicidal ideas. The patient is nervous/anxious. The patient does not have insomnia.        Objective:    Physical Exam Constitutional:      General: He is not in acute distress.    Appearance: Normal appearance. He is not ill-appearing or toxic-appearing.  HENT:     Head: Normocephalic and atraumatic.     Right Ear: Tympanic membrane, ear canal and external ear normal.     Left Ear: Tympanic membrane, ear canal and external ear normal.     Nose: Nose normal. No rhinorrhea.     Mouth/Throat:     Mouth: Mucous membranes are moist.  Eyes:     General:        Right eye: No discharge.        Left eye: No discharge.     Conjunctiva/sclera: Conjunctivae normal.     Pupils: Pupils are equal, round, and reactive to light.  Cardiovascular:     Rate and Rhythm: Normal rate.     Pulses: Normal pulses.     Heart sounds: Normal heart sounds. No murmur heard. Pulmonary:     Effort: Pulmonary effort is normal.     Breath sounds: No wheezing.  Abdominal:     General: Bowel sounds are normal.     Palpations: There is no mass.     Tenderness: There is no abdominal tenderness. There is no guarding.  Musculoskeletal:        General: No swelling.     Right lower leg: No edema.     Left lower leg: No edema.  Skin:    General: Skin is warm.     Findings: No rash.   Neurological:     Mental Status: He is alert and oriented to person, place, and time.  Psychiatric:        Behavior: Behavior normal.     BP 126/80 (BP Location: Right Arm, Patient Position: Sitting, Cuff Size: Normal)   Pulse 73   Temp 97.9 F (36.6 C) (Oral)   Resp 16   Ht 6' (1.829 m)   Wt 271 lb 12.8 oz (123.3 kg)   SpO2 98%   BMI 36.86 kg/m  Wt Readings from Last 3 Encounters:  10/30/22 271 lb 12.8 oz (123.3 kg)  09/28/21 247 lb (112 kg)  09/13/20 263 lb 11.2 oz (119.6 kg)    Diabetic Foot Exam - Simple   No data filed    Lab Results  Component Value Date   WBC 7.3 09/13/2020   HGB 16.3 09/13/2020   HCT 47.2 09/13/2020   PLT 235 09/13/2020   GLUCOSE 123 (H) 09/13/2020   CHOL 150 06/21/2020   TRIG 143 06/21/2020   HDL 38 (L) 06/21/2020   LDLDIRECT 117.0 01/28/2019   LDLCALC 87 06/21/2020   ALT 19 09/13/2020   AST 15 09/13/2020   NA 139 09/13/2020   K 4.3 09/13/2020   CL 103 09/13/2020   CREATININE 1.03 09/13/2020   BUN 12 09/13/2020   CO2 26 09/13/2020   TSH 1.490 06/21/2020   PSA 0.22 09/13/2020  HGBA1C 7.7 (H) 06/21/2020   MICROALBUR 0.9 01/28/2019    Lab Results  Component Value Date   TSH 1.490 06/21/2020   Lab Results  Component Value Date   WBC 7.3 09/13/2020   HGB 16.3 09/13/2020   HCT 47.2 09/13/2020   MCV 89.7 09/13/2020   PLT 235 09/13/2020   Lab Results  Component Value Date   NA 139 09/13/2020   K 4.3 09/13/2020   CO2 26 09/13/2020   GLUCOSE 123 (H) 09/13/2020   BUN 12 09/13/2020   CREATININE 1.03 09/13/2020   BILITOT 0.8 09/13/2020   ALKPHOS 91 06/21/2020   AST 15 09/13/2020   ALT 19 09/13/2020   PROT 6.8 09/13/2020   ALBUMIN 3.9 (L) 06/21/2020   CALCIUM 9.4 09/13/2020   GFR 86.96 01/28/2019   Lab Results  Component Value Date   CHOL 150 06/21/2020   Lab Results  Component Value Date   HDL 38 (L) 06/21/2020   Lab Results  Component Value Date   LDLCALC 87 06/21/2020   Lab Results  Component Value Date    TRIG 143 06/21/2020   Lab Results  Component Value Date   CHOLHDL 3.9 06/21/2020   Lab Results  Component Value Date   HGBA1C 7.7 (H) 06/21/2020       Assessment & Plan:   Problem List Items Addressed This Visit     Mixed hyperlipidemia    Encourage heart healthy diet such as MIND or DASH diet, increase exercise, avoid trans fats, simple carbohydrates and processed foods, consider a krill or fish or flaxseed oil cap daily.  Patient not taking Atorvastatin      Relevant Medications   EPINEPHrine (EPIPEN 2-PAK) 0.3 mg/0.3 mL IJ SOAJ injection   nitroGLYCERIN (NITROSTAT) 0.4 MG SL tablet   Other Relevant Orders   Lipid panel   Depression with anxiety    Patient not taking his Wellbutrin      Back pain    Struggles with chronic pain but manages without medication most days. Allowed refill on Hydrocodone which he takes infrequently      Relevant Medications   HYDROcodone-acetaminophen (NORCO) 7.5-325 MG tablet   Diabetes mellitus type 2 in obese ([place]) - Primary    hgba1c acceptable, minimize simple carbs. Increase exercise as tolerated. Continue current meds patient not taking Glimepiride. Consider flu, covid boosters. Shingrix is the new shingles shot, 2 shots over 2-6 months, confirm coverage with insurance and document, then can return here for shots with nurse appt or at pharmacy. He has not been checking his sugars or taking his meds.  Needs Colonoscopy      Relevant Orders   Ambulatory referral to Cardiology   Comprehensive metabolic panel   Hemoglobin A1c   Microalbumin / creatinine urine ratio   Colon cancer screening    Referred to gastroenterology for colonoscopy      Relevant Orders   Ambulatory referral to Gastroenterology   Atypical chest pain    Referred to cardiology for evaluation due to intermittent chest tightness with some associated SOB. He is given a refill on NTG to use prn and he will seek care if pain returns and does not resolve.        Relevant Orders   Ambulatory referral to Cardiology   CBC   Tobacco abuse    Encouraged complete cessation. Discussed need to quit as relates to risk of numerous cancers, cardiac and pulmonary disease as well as neurologic complications. Counseled for greater than 3 minutes. Has dropped  from 2 ppd to 1/2 ppd he is encouraged to try and stop altogether.       Right hip pain   Relevant Orders   DG HIP UNILAT WITH PELVIS 2-3 VIEWS RIGHT   Nocturia    Check PSA      Relevant Orders   PSA   Palpitations   Relevant Orders   Ambulatory referral to Cardiology   CBC   TSH   Other Visit Diagnoses     Need for influenza vaccination       Relevant Orders   Flu Vaccine QUAD 6+ mos PF IM (Fluarix Quad PF) (Completed)       I am having Gary Gurney. "Gary Short" start on EPINEPHrine. I am also having Gary Short maintain his glucose blood, aspirin EC, Microlet Lancets, atorvastatin, glimepiride, Insulin Syringe-Needle U-100, ReliOn Pen Needles, NovoLIN N FlexPen ReliOn, buPROPion, sildenafil, HYDROcodone-acetaminophen, and nitroGLYCERIN.  Meds ordered this encounter  Medications   EPINEPHrine (EPIPEN 2-PAK) 0.3 mg/0.3 mL IJ SOAJ injection    Sig: Inject 0.3 mg into the muscle as needed for anaphylaxis.    Dispense:  1 each    Refill:  2   HYDROcodone-acetaminophen (NORCO) 7.5-325 MG tablet    Sig: Take 1-2 tablets by mouth every 6 (six) hours as needed.    Dispense:  30 tablet    Refill:  0   nitroGLYCERIN (NITROSTAT) 0.4 MG SL tablet    Sig: Place 1 tablet (0.4 mg total) under the tongue every 5 (five) minutes as needed for chest pain.    Dispense:  25 tablet    Refill:  1     Penni Homans, MD

## 2022-10-30 NOTE — Assessment & Plan Note (Signed)
Check PSA. ?

## 2022-10-30 NOTE — Assessment & Plan Note (Signed)
Referred to gastroenterology for colonoscopy

## 2022-10-30 NOTE — Patient Instructions (Signed)
Shingrix is the new shingles shot, 2 shots over 2-6 months, confirm coverage with insurance and document, then can return here for shots with nurse appt or at pharmacy     Preventive Care 40-51 Years Old, Male Preventive care refers to lifestyle choices and visits with your health care provider that can promote health and wellness. Preventive care visits are also called wellness exams. What can I expect for my preventive care visit? Counseling During your preventive care visit, your health care provider may ask about your: Medical history, including: Past medical problems. Family medical history. Current health, including: Emotional well-being. Home life and relationship well-being. Sexual activity. Lifestyle, including: Alcohol, nicotine or tobacco, and drug use. Access to firearms. Diet, exercise, and sleep habits. Safety issues such as seatbelt and bike helmet use. Sunscreen use. Work and work environment. Physical exam Your health care provider will check your: Height and weight. These may be used to calculate your BMI (body mass index). BMI is a measurement that tells if you are at a healthy weight. Waist circumference. This measures the distance around your waistline. This measurement also tells if you are at a healthy weight and may help predict your risk of certain diseases, such as type 2 diabetes and high blood pressure. Heart rate and blood pressure. Body temperature. Skin for abnormal spots. What immunizations do I need?  Vaccines are usually given at various ages, according to a schedule. Your health care provider will recommend vaccines for you based on your age, medical history, and lifestyle or other factors, such as travel or where you work. What tests do I need? Screening Your health care provider may recommend screening tests for certain conditions. This may include: Lipid and cholesterol levels. Diabetes screening. This is done by checking your blood sugar  (glucose) after you have not eaten for a while (fasting). Hepatitis B test. Hepatitis C test. HIV (human immunodeficiency virus) test. STI (sexually transmitted infection) testing, if you are at risk. Lung cancer screening. Prostate cancer screening. Colorectal cancer screening. Talk with your health care provider about your test results, treatment options, and if necessary, the need for more tests. Follow these instructions at home: Eating and drinking  Eat a diet that includes fresh fruits and vegetables, whole grains, lean protein, and low-fat dairy products. Take vitamin and mineral supplements as recommended by your health care provider. Do not drink alcohol if your health care provider tells you not to drink. If you drink alcohol: Limit how much you have to 0-2 drinks a day. Know how much alcohol is in your drink. In the [place] one drink equals one 12 oz bottle of beer (355 mL), one 5 oz glass of wine (148 mL), or one 1 oz glass of hard liquor (44 mL). Lifestyle Brush your teeth every morning and night with fluoride toothpaste. Floss one time each day. Exercise for at least 30 minutes 5 or more days each week. Do not use any products that contain nicotine or tobacco. These products include cigarettes, chewing tobacco, and vaping devices, such as e-cigarettes. If you need help quitting, ask your health care provider. Do not use drugs. If you are sexually active, practice safe sex. Use a condom or other form of protection to prevent STIs. Take aspirin only as told by your health care provider. Make sure that you understand how much to take and what form to take. Work with your health care provider to find out whether it is safe and beneficial for you to take aspirin daily.   Find healthy ways to manage stress, such as: Meditation, yoga, or listening to music. Journaling. Talking to a trusted person. Spending time with friends and family. Minimize exposure to UV radiation to reduce  your risk of skin cancer. Safety Always wear your seat belt while driving or riding in a vehicle. Do not drive: If you have been drinking alcohol. Do not ride with someone who has been drinking. When you are tired or distracted. While texting. If you have been using any mind-altering substances or drugs. Wear a helmet and other protective equipment during sports activities. If you have firearms in your house, make sure you follow all gun safety procedures. What's next? Go to your health care provider once a year for an annual wellness visit. Ask your health care provider how often you should have your eyes and teeth checked. Stay up to date on all vaccines. This information is not intended to replace advice given to you by your health care provider. Make sure you discuss any questions you have with your health care provider. Document Revised: 05/11/2021 Document Reviewed: 05/11/2021 Elsevier Patient Education  2023 [employer]  

## 2022-10-31 LAB — CBC
HCT: 48.8 % (ref 39.0–52.0)
Hemoglobin: 16.5 g/dL (ref 13.0–17.0)
MCHC: 33.9 g/dL (ref 30.0–36.0)
MCV: 95.6 fl (ref 78.0–100.0)
Platelets: 209 10*3/uL (ref 150.0–400.0)
RBC: 5.1 Mil/uL (ref 4.22–5.81)
RDW: 13.5 % (ref 11.5–15.5)
WBC: 8.6 10*3/uL (ref 4.0–10.5)

## 2022-10-31 LAB — HEMOGLOBIN A1C: Hgb A1c MFr Bld: 8.2 % — ABNORMAL HIGH (ref 4.6–6.5)

## 2022-10-31 LAB — LIPID PANEL
Cholesterol: 204 mg/dL — ABNORMAL HIGH (ref 0–200)
HDL: 50.2 mg/dL (ref >39.00)
NonHDL: 153.52
Total CHOL/HDL Ratio: 4
Triglycerides: 349 mg/dL — ABNORMAL HIGH (ref 0.0–149.0)
VLDL: 69.8 mg/dL — ABNORMAL HIGH (ref 0.0–40.0)

## 2022-10-31 LAB — COMPREHENSIVE METABOLIC PANEL
ALT: 32 U/L (ref 0–53)
AST: 21 U/L (ref 0–37)
Albumin: 4.3 g/dL (ref 3.5–5.2)
Alkaline Phosphatase: 62 U/L (ref 39–117)
BUN: 15 mg/dL (ref 6–23)
CO2: 27 mEq/L (ref 19–32)
Calcium: 9 mg/dL (ref 8.4–10.5)
Chloride: 99 mEq/L (ref 96–112)
Creatinine, Ser: 1 mg/dL (ref 0.40–1.50)
GFR: 87.38 mL/min (ref >60.00)
Glucose, Bld: 113 mg/dL — ABNORMAL HIGH (ref 70–99)
Potassium: 4.5 mEq/L (ref 3.5–5.1)
Sodium: 136 mEq/L (ref 135–145)
Total Bilirubin: 0.6 mg/dL (ref 0.2–1.2)
Total Protein: 6.9 g/dL (ref 6.0–8.3)

## 2022-10-31 LAB — TSH: TSH: 2.3 u[IU]/mL (ref 0.35–5.50)

## 2022-10-31 LAB — LDL CHOLESTEROL, DIRECT: Direct LDL: 117 mg/dL

## 2022-10-31 LAB — PSA: PSA: 0.19 ng/mL (ref 0.10–4.00)

## 2022-11-06 ENCOUNTER — Other Ambulatory Visit: Payer: Self-pay

## 2022-11-06 ENCOUNTER — Encounter: Payer: Self-pay | Admitting: Family Medicine

## 2022-11-06 MED ORDER — ATORVASTATIN CALCIUM 10 MG PO TABS: 10.0000 mg | ORAL_TABLET | Freq: Every day | ORAL | 3 refills | Status: DC

## 2022-12-03 ENCOUNTER — Encounter: Payer: Self-pay | Admitting: Family Medicine

## 2022-12-04 MED ORDER — GLIMEPIRIDE 2 MG PO TABS: 2.0000 mg | ORAL_TABLET | Freq: Two times a day (BID) | ORAL | 0 refills | Status: DC

## 2022-12-30 ENCOUNTER — Encounter: Payer: Self-pay | Admitting: Family Medicine

## 2023-03-03 ENCOUNTER — Other Ambulatory Visit: Payer: Self-pay | Admitting: Family Medicine

## 2023-03-26 IMAGING — MR MR HEAD WO/W CM
13 series · 48 of 48 positions shown · IV contrast (20 ml multihance)
Comparison: None.

CLINICAL DATA: Right facial droop, headache and blurred vision.
Right arm numbness. Duration of symptoms 1 month.

EXAM:
MRI HEAD WITHOUT AND WITH CONTRAST
TECHNIQUE: Multiplanar, multiecho pulse sequences of the brain and surrounding
structures were obtained without and with intravenous contrast.
CONTRAST:  20mL MULTIHANCE GADOBENATE DIMEGLUMINE 529 MG/ML IV SOLN

[Series 5: T1 · sagittal · 4.0mm · 0.75mm/px · 2 of 31 slices shown (1 of 3)]
[im 1/31]
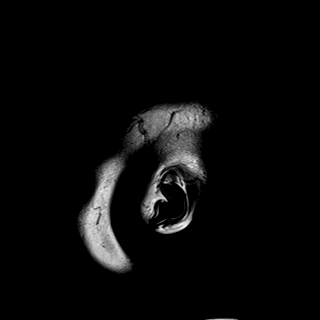
[im 31/31]
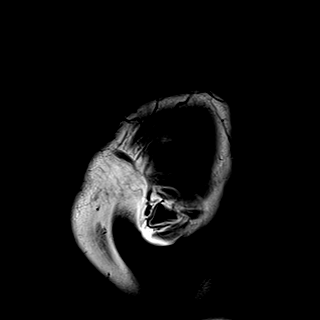

[Series 6: DWI · axial · 3.0mm · 0.94mm/px · z∈[-53,+93]mm · 9 of 168 slices shown (1 of 3)]
[im 1/168]
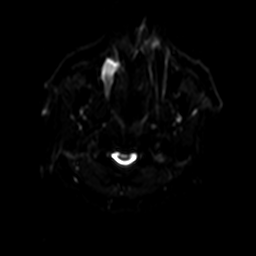
[im 21/168]
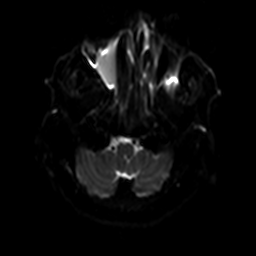
[im 42/168]
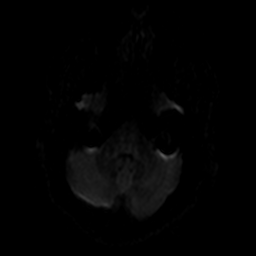
[im 63/168]
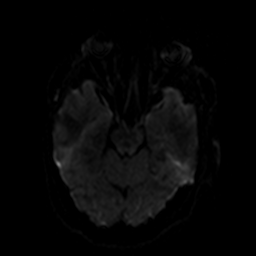
[im 84/168]
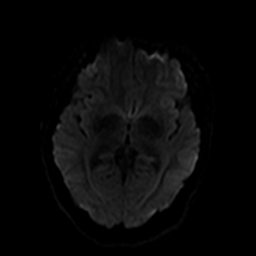
[im 105/168]
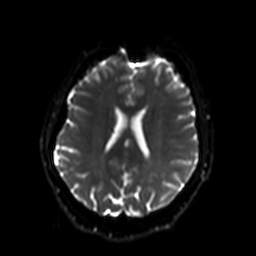
[im 126/168]
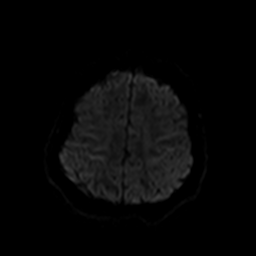
[im 147/168]
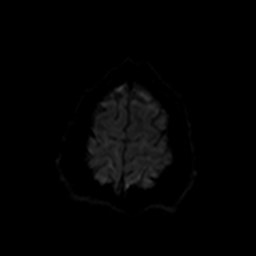
[im 168/168]
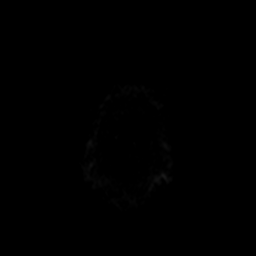

[Series 7: ax dwi_tracew · axial · 3.0mm · 0.94mm/px · z∈[-53,+93]mm · 4 of 84 slices shown]
[im 1/84]
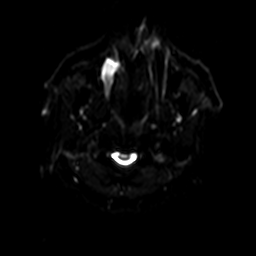
[im 28/84]
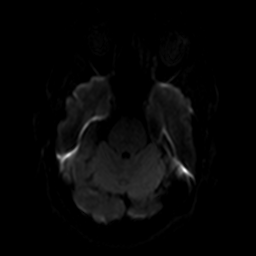
[im 56/84]
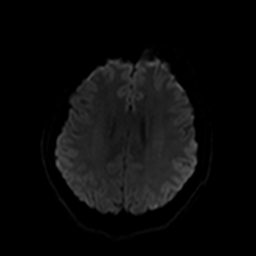
[im 84/84]
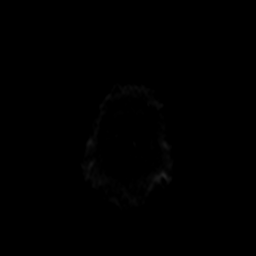

[Series 8: ax dwi_adc · axial · 3.0mm · 0.94mm/px · z∈[-53,+93]mm · 2 of 42 slices shown]
[im 1/42]
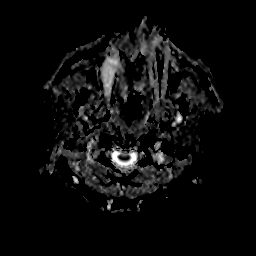
[im 42/42]
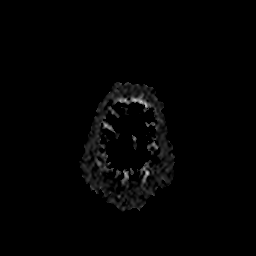

[Series 9: DWI · coronal · 5.0mm · 1.44mm/px · 3 of 60 slices shown (2 of 3)]
[im 1/60]
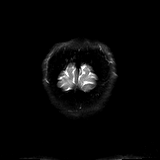
[im 30/60]
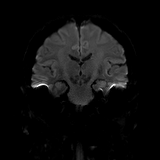
[im 60/60]
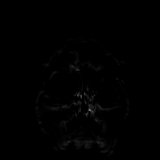

[Series 10: DWI · coronal · 5.0mm · 1.44mm/px · 2 of 30 slices shown (3 of 3)]
[im 1/30]
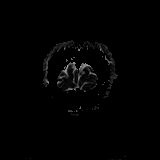
[im 30/30]
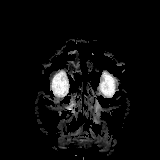

[Series 12: FLAIR · axial · 3.0mm · 0.72mm/px · 1 of 26 slices shown]
[im 1/26]
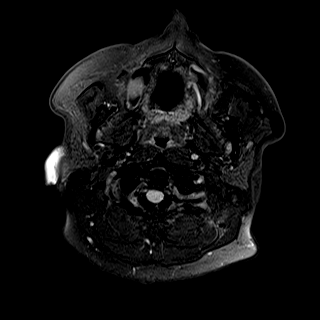

[Series 13: swi_images · axial · 2.2mm · 0.90mm/px · z∈[-58,+97]mm · 4 of 72 slices shown]
[im 1/72]
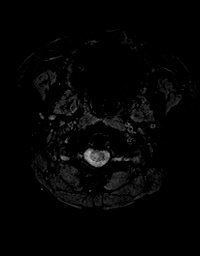
[im 24/72]
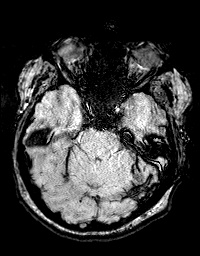
[im 48/72]
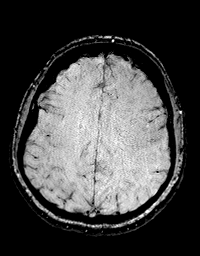
[im 72/72]
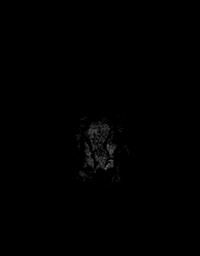

[Series 15: T1 · axial · 1.0mm · 0.94mm/px · z∈[-59,+99]mm · 8 of 160 slices shown (2 of 3)]
[im 1/160]
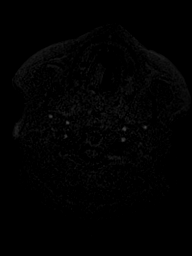
[im 23/160]
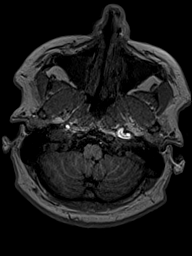
[im 46/160]
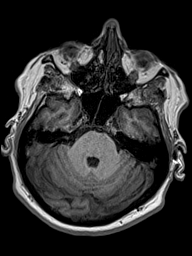
[im 69/160]
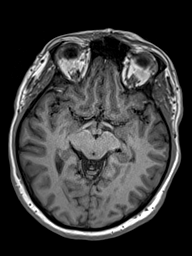
[im 91/160]
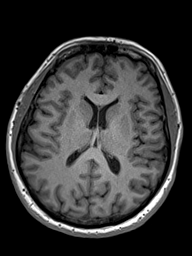
[im 114/160]
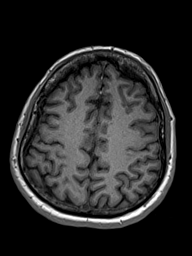
[im 137/160]
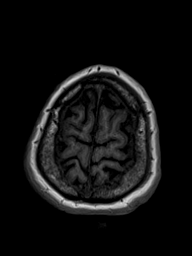
[im 160/160]
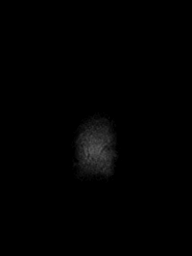

[Series 17: T2 · axial · 4.0mm · 0.36mm/px · 1 of 27 slices shown]
[im 1/27]
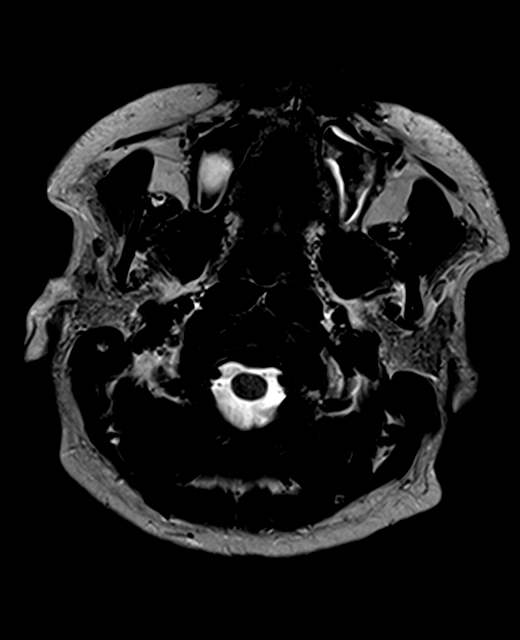

[Series 22: T2 post-contrast · coronal · 4.0mm · 0.36mm/px · 2 of 35 slices shown]
[im 1/35]
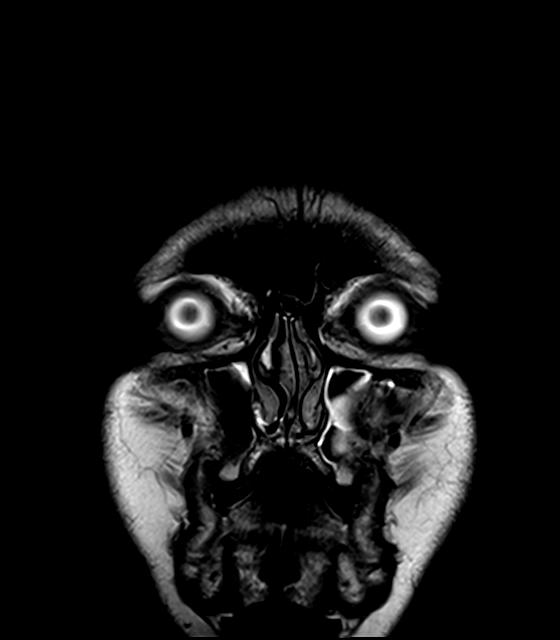
[im 35/35]
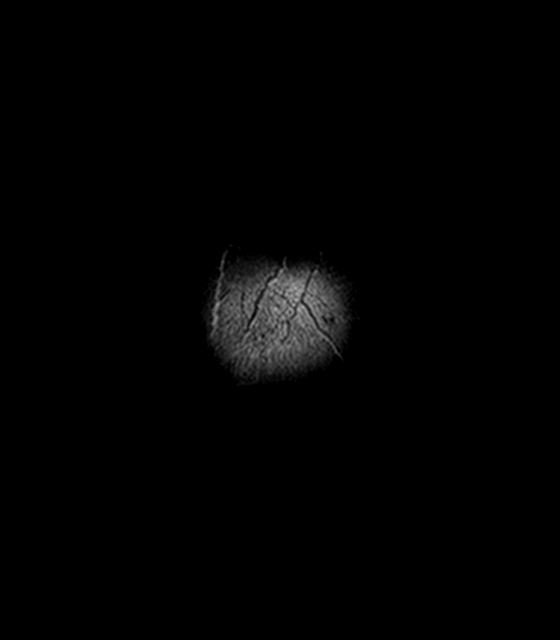

[Series 23: T1 · axial · 1.0mm · 0.94mm/px · z∈[-58,+98]mm · 8 of 160 slices shown (3 of 3)]
[im 1/160]
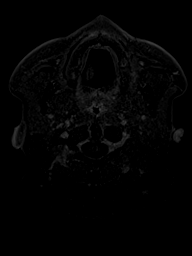
[im 23/160]
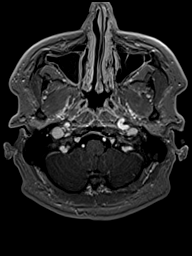
[im 46/160]
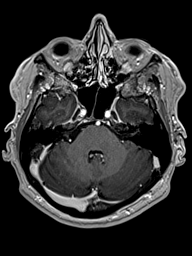
[im 69/160]
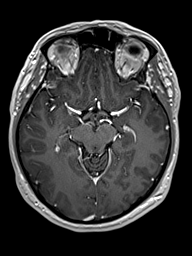
[im 91/160]
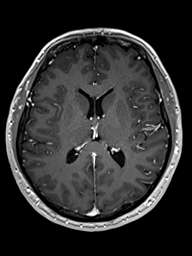
[im 114/160]
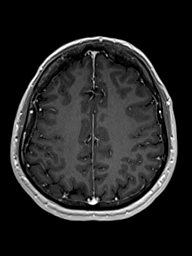
[im 137/160]
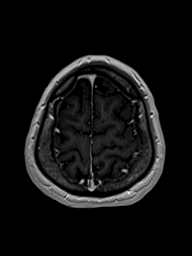
[im 160/160]
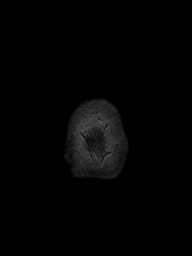

[Series 24: T1 post-contrast · coronal · 4.0mm · 0.72mm/px · 2 of 32 slices shown]
[im 1/32]
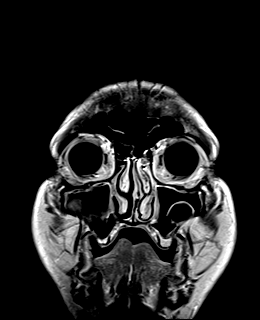
[im 32/32]
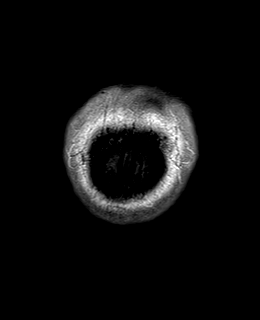

[48 of 48 positions shown; findings below may reference images not displayed]

FINDINGS: Brain: Diffusion imaging does not show any acute or subacute
infarction. The brainstem and cerebellum are normal. Cerebral
hemispheres are normal without evidence of old stroke, mass,
hemorrhage, hydrocephalus or extra-axial collection. No sign of
demyelinating disease. After contrast administration, no abnormal
enhancement occurs.

Vascular: Major vessels at the base of the brain show flow.

Skull and upper cervical spine: Negative

Sinuses/Orbits: Mucosal inflammation and retention cysts in the
maxillary sinuses. Other sinuses are clear. Orbits negative.

Other: None
IMPRESSION: Normal appearance of the brain itself. No abnormality seen to
explain the presenting symptoms. No evidence of stroke or
demyelinating disease.

Mucosal inflammatory changes and retention cysts of the maxillary
sinuses.

## 2023-04-18 ENCOUNTER — Other Ambulatory Visit: Payer: Self-pay | Admitting: Family Medicine

## 2023-04-30 NOTE — Assessment & Plan Note (Signed)
hgba1c acceptable, minimize simple carbs. Increase exercise as tolerated. Continue current med 

## 2023-04-30 NOTE — Assessment & Plan Note (Signed)
Encourage heart healthy diet such as MIND or DASH diet, increase exercise, avoid trans fats, simple carbohydrates and processed foods, consider a krill or fish or flaxseed oil cap daily.  °

## 2023-05-01 ENCOUNTER — Ambulatory Visit (INDEPENDENT_AMBULATORY_CARE_PROVIDER_SITE_OTHER): Payer: Medicare Other | Admitting: Family Medicine

## 2023-05-01 VITALS — BP 134/82 | HR 74 | Temp 98.0°F | Resp 16 | Ht 72.0 in | Wt 260.0 lb

## 2023-05-01 DIAGNOSIS — R059 Cough, unspecified: Secondary | ICD-10-CM

## 2023-05-01 DIAGNOSIS — E669 Obesity, unspecified: Secondary | ICD-10-CM | POA: Diagnosis not present

## 2023-05-01 DIAGNOSIS — E782 Mixed hyperlipidemia: Secondary | ICD-10-CM

## 2023-05-01 DIAGNOSIS — E1169 Type 2 diabetes mellitus with other specified complication: Secondary | ICD-10-CM | POA: Diagnosis not present

## 2023-05-01 DIAGNOSIS — M549 Dorsalgia, unspecified: Secondary | ICD-10-CM | POA: Diagnosis not present

## 2023-05-01 DIAGNOSIS — R109 Unspecified abdominal pain: Secondary | ICD-10-CM | POA: Diagnosis not present

## 2023-05-01 DIAGNOSIS — Z7984 Long term (current) use of oral hypoglycemic drugs: Secondary | ICD-10-CM

## 2023-05-01 NOTE — Patient Instructions (Signed)

## 2023-05-01 NOTE — Progress Notes (Signed)
Subjective:   By signing my name below, I, Gary Short, attest that this documentation has been prepared under the direction and in the presence of Gary Canary, MD., 05/01/2023.   Patient ID: Gary Short., male    DOB: [birth year], 52 y.o.   MRN: [id number]  No chief complaint on file.  HPI Patient is in today for an office visit. He denies recent hospitalization, febrile illness, CP/palpitations/SOB/HA/fever/chills/GU symptoms.  Cough and Sputum Production Patient reports that he has been coughing and producing thick phlegm for the past few weeks. He has used Claritin as needed but his cough has been lingering. He is interested in receiving an inhaler to manage this as well as some tightness in his chest.  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 He states that he was already experiencing low back pain which exacerbated upon falling. The pain is not causing debilitation and he does not want to proceed with an x-ray of the back at this time  Hyperlipidemia Patient did not tolerate Atorvastatin 10 mg and has not been managing his hyperlipidemia. He states that his current insurance does not cover medications so he is requesting an affordable medication to take.  Intermittent Abdominal Pain Patient reports that he has been experiencing intermittent sharp abdominal pain in the naval region for the past several months. He rates the pain as 9/10 upon onset which then decreases to 5-6/10. The pain lasts up to 24 hours and then subsides. He further states that he had a colonoscopy scheduled but did not complete this due to sudden family events. He denies nausea/vomiting/constipation/diarrhea/blood in stool.  Type 2 Diabetes Mellitus with Obesity Patient monitors his blood glucose at home and reports that his average has been 120-130 mg/dL and occasionally above 200 mg/dL. He has been attempting to maintain a balanced diet and his body mass index is  35.26 kg/m. Wt Readings from Last 3 Encounters:  05/01/23 260 lb (117.9 kg)  10/30/22 271 lb 12.8 oz (123.3 kg)  09/28/21 247 lb (112 kg)   Lab Results  Component Value Date   HGBA1C 8.2 (H) 10/30/2022   Past Medical History:  Diagnosis Date   Atypical chest pain 05/08/2017   Back pain 06/13/2010   Qualifier: Diagnosis of  By: Abner Greenspan MD, Eldridge Dace back at work in 2002, was able to work until 2004. Ultimately had  Surgeries to his lower back twice and after complications with a spinal leak, was never able to return to work Radicular symptoms occur b/l.    Knee pain, bilateral 08/26/2014   Medicare annual wellness visit, subsequent 05/07/2016   Obesity, unspecified 06/13/2010   Qualifier: Diagnosis of  By: Abner Greenspan MD, Misty Stanley      Past Surgical History:  Procedure Laterality Date   BACK SURGERY     X 2   KNEE SURGERY  1999   cartilage repair    Family History  Problem Relation Age of Onset   Cancer Mother        breast   Diabetes Mother    Hyperlipidemia Mother    Hypertension Mother    Stroke Father    Diabetes Father    Hyperlipidemia Father    Hypertension Father    Diabetes Sister    Arthritis Sister    Diabetes Sister    Cancer Maternal Uncle        throat, stomach   Cancer Maternal Grandmother    Arthritis Other  family hx of   Diabetes Other        family hx of   Hypertension Other        family hx of   Stroke Other        family hx of   Sudden death Other        family hx of    Social History   Socioeconomic History   Marital status: Married    Spouse name: Not on file   Number of children: Not on file   Years of education: Not on file   Highest education level: Not on file  Occupational History   Not on file  Tobacco Use   Smoking status: Every Day    Packs/day: .5    Types: Cigarettes   Smokeless tobacco: Never  Substance and Sexual Activity   Alcohol use: No   Drug use: No   Sexual activity: Not on file  Other Topics Concern    Not on file  Social History Narrative   Not on file   Social Determinants of Health   Financial Resource Strain: Medium Risk (09/28/2021)   Overall Financial Resource Strain (CARDIA)    Difficulty of Paying Living Expenses: Somewhat hard  Food Insecurity: Food Insecurity Present (09/28/2021)   Hunger Vital Sign    Worried About Running Out of Food in the Last Year: Sometimes true    Ran Out of Food in the Last Year: Never true  Transportation Needs: No Transportation Needs (09/28/2021)   PRAPARE - Administrator, Civil Service (Medical): No    Lack of Transportation (Non-Medical): No  Physical Activity: Inactive (09/28/2021)   Exercise Vital Sign    Days of Exercise per Week: 0 days    Minutes of Exercise per Session: 0 min  Stress: Stress Concern Present (09/28/2021)   [employer] of Occupational Health - Occupational Stress Questionnaire    Feeling of Stress : To some extent  Social Connections: Moderately Isolated (09/28/2021)   Social Connection and Isolation Panel [NHANES]    Frequency of Communication with Friends and Family: More than three times a week    Frequency of Social Gatherings with Friends and Family: More than three times a week    Attends Religious Services: Never    Database administrator or Organizations: No    Attends Banker Meetings: Never    Marital Status: Married  Catering manager Violence: Not At Risk (09/28/2021)   Humiliation, Afraid, Rape, and Kick questionnaire    Fear of Current or Ex-Partner: No    Emotionally Abused: No    Physically Abused: No    Sexually Abused: No    Outpatient Medications Prior to Visit  Medication Sig Dispense Refill   glimepiride (AMARYL) 2 MG tablet Take 1 tablet (2 mg total) by mouth in the morning and at bedtime. 180 tablet 0   aspirin EC 81 MG tablet Take 1 tablet (81 mg total) by mouth daily.     atorvastatin (LIPITOR) 10 MG tablet Take 1 tablet (10 mg total) by mouth at bedtime. 30  tablet 3   buPROPion (WELLBUTRIN XL) 150 MG 24 hr tablet 150 mg po daily x 4 days and then 300 mg po daily 60 tablet 3   EPINEPHrine (EPIPEN 2-PAK) 0.3 mg/0.3 mL IJ SOAJ injection Inject 0.3 mg into the muscle as needed for anaphylaxis. 1 each 2   glucose blood (BAYER CONTOUR TEST) test strip Check blood glucose once daily and as needed.  Dx: E11.9 100 each 5   HYDROcodone-acetaminophen (NORCO) 7.5-325 MG tablet Take 1-2 tablets by mouth every 6 (six) hours as needed. 30 tablet 0   MICROLET LANCETS MISC Check blood sugar DX E11.9 100 each 5   nitroGLYCERIN (NITROSTAT) 0.4 MG SL tablet Place 1 tablet (0.4 mg total) under the tongue every 5 (five) minutes as needed for chest pain. 25 tablet 1   sildenafil (REVATIO) 20 MG tablet TAKE 1 TO 5 TABLETS BY MOUTH ONCE DAILY AS NEEDED FOR ERECTILE DYSFUNCTION 30 tablet 0   No facility-administered medications prior to visit.    Allergies  Allergen Reactions   Iodine Shortness Of Breath    Per pt "I felt like I was going to die"   Wasp Venom Swelling    syncope   Iodides     Review of Systems  Constitutional:  Negative for chills and fever.  Respiratory:  Positive for cough and sputum production. Negative for shortness of breath.   Cardiovascular:  Negative for chest pain and palpitations.  Gastrointestinal:  Negative for abdominal pain, blood in stool, constipation, diarrhea, nausea and vomiting.  Genitourinary:  Negative for dysuria, frequency, hematuria and urgency.  Musculoskeletal:  Positive for back pain.  Skin:           Neurological:  Negative for headaches.       Objective:    Physical Exam Constitutional:      General: He is not in acute distress.    Appearance: Normal appearance. He is obese. He is not ill-appearing.  HENT:     Head: Normocephalic and atraumatic.     Right Ear: External ear normal.     Left Ear: External ear normal.     Nose: Nose normal.     Mouth/Throat:     Mouth: Mucous membranes are moist.      Pharynx: Oropharynx is clear.  Eyes:     General:        Right eye: No discharge.        Left eye: No discharge.     Extraocular Movements: Extraocular movements intact.     Conjunctiva/sclera: Conjunctivae normal.     Pupils: Pupils are equal, round, and reactive to light.  Cardiovascular:     Rate and Rhythm: Normal rate and regular rhythm.     Pulses: Normal pulses.     Heart sounds: Normal heart sounds. No murmur heard.    No gallop.  Pulmonary:     Effort: Pulmonary effort is normal. No respiratory distress.     Breath sounds: Examination of the right-upper field reveals wheezing. Examination of the left-upper field reveals wheezing. Examination of the right-middle field reveals wheezing. Examination of the left-middle field reveals wheezing. Wheezing present. No rales.  Abdominal:     General: Bowel sounds are normal.     Palpations: Abdomen is soft.     Tenderness: There is no abdominal tenderness. There is no guarding.  Musculoskeletal:        General: Normal range of motion.     Cervical back: Normal range of motion.     Right lower leg: No edema.     Left lower leg: No edema.  Skin:    General: Skin is warm and dry.  Neurological:     Mental Status: He is alert and oriented to person, place, and time.  Psychiatric:        Mood and Affect: Mood normal.        Behavior: Behavior normal.  Judgment: Judgment normal.     There were no vitals taken for this visit. Wt Readings from Last 3 Encounters:  10/30/22 271 lb 12.8 oz (123.3 kg)  09/28/21 247 lb (112 kg)  09/13/20 263 lb 11.2 oz (119.6 kg)    Diabetic Foot Exam - Simple   No data filed    Lab Results  Component Value Date   WBC 8.6 10/30/2022   HGB 16.5 10/30/2022   HCT 48.8 10/30/2022   PLT 209.0 10/30/2022   GLUCOSE 113 (H) 10/30/2022   CHOL 204 (H) 10/30/2022   TRIG 349.0 (H) 10/30/2022   HDL 50.20 10/30/2022   LDLDIRECT 117.0 10/30/2022   LDLCALC 87 06/21/2020   ALT 32 10/30/2022   AST  21 10/30/2022   NA 136 10/30/2022   K 4.5 10/30/2022   CL 99 10/30/2022   CREATININE 1.00 10/30/2022   BUN 15 10/30/2022   CO2 27 10/30/2022   TSH 2.30 10/30/2022   PSA 0.19 10/30/2022   HGBA1C 8.2 (H) 10/30/2022   MICROALBUR 0.9 01/28/2019    Lab Results  Component Value Date   TSH 2.30 10/30/2022   Lab Results  Component Value Date   WBC 8.6 10/30/2022   HGB 16.5 10/30/2022   HCT 48.8 10/30/2022   MCV 95.6 10/30/2022   PLT 209.0 10/30/2022   Lab Results  Component Value Date   NA 136 10/30/2022   K 4.5 10/30/2022   CO2 27 10/30/2022   GLUCOSE 113 (H) 10/30/2022   BUN 15 10/30/2022   CREATININE 1.00 10/30/2022   BILITOT 0.6 10/30/2022   ALKPHOS 62 10/30/2022   AST 21 10/30/2022   ALT 32 10/30/2022   PROT 6.9 10/30/2022   ALBUMIN 4.3 10/30/2022   CALCIUM 9.0 10/30/2022   GFR 87.38 10/30/2022   Lab Results  Component Value Date   CHOL 204 (H) 10/30/2022   Lab Results  Component Value Date   HDL 50.20 10/30/2022   Lab Results  Component Value Date   LDLCALC 87 06/21/2020   Lab Results  Component Value Date   TRIG 349.0 (H) 10/30/2022   Lab Results  Component Value Date   CHOLHDL 4 10/30/2022   Lab Results  Component Value Date   HGBA1C 8.2 (H) 10/30/2022      Assessment & Plan:  Cough and Sputum Production: Albuterol inhaler prescribed.  Hyperlipidemia: Atorvastatin 10 mg stopped and Pravastatin prescribed.  Intermittent Abdominal Pain: Encouraged patient to proceed with colonoscopy.  Labs: Routine blood work ordered.  Type 2 Diabetes Mellitus with Obesity: Encouraged 6-8 hours of sleep, heart healthy diet, 60-80 oz of non-alcohol/non-caffeinated fluids, and 4000-8000 steps daily. Problem List Items Addressed This Visit       Endocrine   Type 2 diabetes mellitus with obesity (HCC) - Primary     Other   Mixed hyperlipidemia   No orders of the defined types were placed in this encounter.  I, Gary Short, personally preformed the  services described in this documentation.  All medical record entries made by the scribe were at my direction and in my presence.  I have reviewed the chart and discharge instructions (if applicable) and agree that the record reflects my personal performance and is accurate and complete. 05/01/2023  I,Mohammed Iqbal,acting as a scribe for Danise Edge, MD.,have documented all relevant documentation on the behalf of Danise Edge, MD,as directed by  Danise Edge, MD while in the presence of Danise Edge, MD.  Gary Short

## 2023-05-02 ENCOUNTER — Encounter: Payer: Self-pay | Admitting: Family Medicine

## 2023-05-02 DIAGNOSIS — R059 Cough, unspecified: Secondary | ICD-10-CM | POA: Insufficient documentation

## 2023-05-02 DIAGNOSIS — R109 Unspecified abdominal pain: Secondary | ICD-10-CM | POA: Insufficient documentation

## 2023-05-02 LAB — COMPREHENSIVE METABOLIC PANEL
ALT: 29 U/L (ref 0–53)
AST: 21 U/L (ref 0–37)
Albumin: 4.4 g/dL (ref 3.5–5.2)
Alkaline Phosphatase: 65 U/L (ref 39–117)
BUN: 11 mg/dL (ref 6–23)
CO2: 25 mEq/L (ref 19–32)
Calcium: 9.2 mg/dL (ref 8.4–10.5)
Chloride: 99 mEq/L (ref 96–112)
Creatinine, Ser: 1.03 mg/dL (ref 0.40–1.50)
GFR: 84.04 mL/min (ref >60.00)
Glucose, Bld: 119 mg/dL — ABNORMAL HIGH (ref 70–99)
Potassium: 4.4 mEq/L (ref 3.5–5.1)
Sodium: 137 mEq/L (ref 135–145)
Total Bilirubin: 0.7 mg/dL (ref 0.2–1.2)
Total Protein: 7.2 g/dL (ref 6.0–8.3)

## 2023-05-02 LAB — CBC WITH DIFFERENTIAL/PLATELET
Basophils Absolute: 0.1 10*3/uL (ref 0.0–0.1)
Basophils Relative: 0.8 % (ref 0.0–3.0)
Eosinophils Absolute: 0.1 10*3/uL (ref 0.0–0.7)
Eosinophils Relative: 1.5 % (ref 0.0–5.0)
HCT: 50.9 % (ref 39.0–52.0)
Hemoglobin: 16.9 g/dL (ref 13.0–17.0)
Lymphocytes Relative: 41.2 % (ref 12.0–46.0)
Lymphs Abs: 3.5 10*3/uL (ref 0.7–4.0)
MCHC: 33.1 g/dL (ref 30.0–36.0)
MCV: 95.1 fl (ref 78.0–100.0)
Monocytes Absolute: 0.6 10*3/uL (ref 0.1–1.0)
Monocytes Relative: 6.8 % (ref 3.0–12.0)
Neutro Abs: 4.2 10*3/uL (ref 1.4–7.7)
Neutrophils Relative %: 49.7 % (ref 43.0–77.0)
Platelets: 210 10*3/uL (ref 150.0–400.0)
RBC: 5.35 Mil/uL (ref 4.22–5.81)
RDW: 14.7 % (ref 11.5–15.5)
WBC: 8.4 10*3/uL (ref 4.0–10.5)

## 2023-05-02 LAB — LIPID PANEL
Cholesterol: 174 mg/dL (ref 0–200)
HDL: 49.9 mg/dL (ref >39.00)
LDL Cholesterol: 87 mg/dL (ref 0–99)
NonHDL: 124.52
Total CHOL/HDL Ratio: 3
Triglycerides: 187 mg/dL — ABNORMAL HIGH (ref 0.0–149.0)
VLDL: 37.4 mg/dL (ref 0.0–40.0)

## 2023-05-02 LAB — HEMOGLOBIN A1C: Hgb A1c MFr Bld: 8.5 % — ABNORMAL HIGH (ref 4.6–6.5)

## 2023-05-02 LAB — LIPASE: Lipase: 19 U/L (ref 11.0–59.0)

## 2023-05-02 LAB — AMYLASE: Amylase: 24 U/L — ABNORMAL LOW (ref 27–131)

## 2023-05-02 LAB — TSH: TSH: 1.67 u[IU]/mL (ref 0.35–5.50)

## 2023-05-02 NOTE — Assessment & Plan Note (Signed)
Struggles daily but declines further meds, referral or work up at this time

## 2023-05-02 NOTE — Assessment & Plan Note (Signed)
Some allergic symptoms suggestive of allergic asthmatic response with sputum, cough and wheezing. Try Albuterol prn and report worsening

## 2023-05-02 NOTE — Assessment & Plan Note (Signed)
Intermittent and usually LUQ can be 8-9/10 and last all day then might not reoccur for days. He has been referred for colonoscopy but has not completed he agrees to proceed and report if worsens

## 2023-05-03 ENCOUNTER — Other Ambulatory Visit: Payer: Self-pay

## 2023-05-03 MED ORDER — GLYBURIDE MICRONIZED 1.5 MG PO TABS: 1.5000 mg | ORAL_TABLET | Freq: Every day | ORAL | 2 refills | Status: DC

## 2023-05-03 MED ORDER — PRAVASTATIN SODIUM 10 MG PO TABS: 10.0000 mg | ORAL_TABLET | Freq: Every day | ORAL | 2 refills | Status: DC

## 2023-05-20 ENCOUNTER — Other Ambulatory Visit: Payer: Self-pay | Admitting: Family Medicine

## 2023-06-29 ENCOUNTER — Other Ambulatory Visit: Payer: Self-pay | Admitting: Family Medicine

## 2023-08-02 ENCOUNTER — Other Ambulatory Visit: Payer: Self-pay | Admitting: Family Medicine

## 2023-10-09 ENCOUNTER — Telehealth: Payer: Self-pay | Admitting: Family Medicine

## 2023-10-09 NOTE — Telephone Encounter (Signed)
Copied from CRM [id number]. Topic: Medicare AWV >> Oct 09, 2023  9:56 AM Payton Doughty wrote: Reason for CRM: Called LVM 10/09/2023 to schedule Annual Wellness Visit  Verlee Rossetti; Care Guide Ambulatory Clinical Support [facility] l [facility] Direct Dial: [phone]

## 2023-11-01 ENCOUNTER — Encounter: Payer: Medicare Other | Admitting: Family Medicine

## 2023-11-01 ENCOUNTER — Other Ambulatory Visit: Payer: Self-pay | Admitting: Family Medicine

## 2023-11-07 ENCOUNTER — Encounter: Payer: Medicare Other | Admitting: Family Medicine

## 2023-11-07 DIAGNOSIS — Z0000 Encounter for general adult medical examination without abnormal findings: Secondary | ICD-10-CM

## 2023-12-03 ENCOUNTER — Ambulatory Visit: Payer: Medicare Other | Admitting: Family Medicine

## 2023-12-06 ENCOUNTER — Ambulatory Visit (INDEPENDENT_AMBULATORY_CARE_PROVIDER_SITE_OTHER): Payer: Medicare Other

## 2023-12-06 VITALS — Ht 72.0 in | Wt 260.0 lb

## 2023-12-06 DIAGNOSIS — Z0000 Encounter for general adult medical examination without abnormal findings: Secondary | ICD-10-CM | POA: Diagnosis not present

## 2023-12-06 DIAGNOSIS — Z1211 Encounter for screening for malignant neoplasm of colon: Secondary | ICD-10-CM

## 2023-12-06 NOTE — Patient Instructions (Addendum)
 Mr. Baize , Thank you for taking time to come for your Medicare Wellness Visit. I appreciate your ongoing commitment to your health goals. Please review the following plan we discussed and let me know if I can assist you in the future.   Referrals/Orders/Follow-Ups/Clinician Recommendations:   This is a list of the screening recommended for you and due dates:  Health Maintenance  Topic Date Due   Complete foot exam   Never done   Eye exam for diabetics  Never done   HIV Screening  Never done   Hepatitis C Screening  Never done   Colon Cancer Screening  Never done   Yearly kidney health urinalysis for diabetes  01/28/2020   Zoster (Shingles) Vaccine (1 of 2) Never done   Flu Shot  06/28/2023   COVID-19 Vaccine (1 - 2024-25 season) Never done   Hemoglobin A1C  10/31/2023   Yearly kidney function blood test for diabetes  04/30/2024   Medicare Annual Wellness Visit  12/05/2024   DTaP/Tdap/Td vaccine (2 - Td or Tdap) 12/24/2024   HPV Vaccine  Aged Out    Advanced directives: (Provided) Advance directive discussed with you today. I have provided a copy for you to complete at home and have notarized. Once this is complete, please bring a copy in to our office so we can scan it into your chart.   Next Medicare Annual Wellness Visit scheduled for next year: Yes  Insert preventive care Attachment reference

## 2023-12-06 NOTE — Progress Notes (Signed)
 Subjective:   Gary Short. is a 53 y.o. male who presents for Medicare Annual/Subsequent preventive examination.  Visit Complete: Virtual I connected with  Prentice JINNY Glendia Mickey. on 12/06/23 by a video and audio enabled telemedicine application and verified that I am speaking with the correct person using two identifiers.  Patient Location: Home  Provider Location: Home Office  I discussed the limitations of evaluation and management by telemedicine. The patient expressed understanding and agreed to proceed.  Vital Signs: Because this visit was a virtual/telehealth visit, some criteria may be missing or patient reported. Any vitals not documented were not able to be obtained and vitals that have been documented are patient reported.    Cardiac Risk Factors include: advanced age (>101men, >60 women);diabetes mellitus;male gender     Objective:    Today's Vitals   12/06/23 1134  Weight: 260 lb (117.9 kg)  Height: 6' (1.829 m)   Body mass index is 35.26 kg/m.     12/06/2023   11:46 AM 09/28/2021   11:55 AM 05/07/2016    5:30 PM  Advanced Directives  Does Patient Have a Medical Advance Directive? No No No  Would patient like information on creating a medical advance directive? No - Patient declined Yes (MAU/Ambulatory/Procedural Areas - Information given) Yes - Educational materials given    Current Medications (verified) Outpatient Encounter Medications as of 12/06/2023  Medication Sig   glimepiride  (AMARYL ) 2 MG tablet Take 1 tablet (2 mg total) by mouth in the morning and at bedtime. (Patient not taking: Reported on 05/01/2023)   glyBURIDE  micronized (GLYNASE ) 1.5 MG tablet Take 1 tablet (1.5 mg total) by mouth daily with breakfast.   pravastatin  (PRAVACHOL ) 10 MG tablet Take 1 tablet (10 mg total) by mouth daily.   aspirin  EC 81 MG tablet Take 1 tablet (81 mg total) by mouth daily.   atorvastatin  (LIPITOR) 10 MG tablet Take 1 tablet (10 mg total) by mouth at bedtime. (Patient  not taking: Reported on 05/01/2023)   glucose blood (BAYER CONTOUR TEST) test strip Check blood glucose once daily and as needed. Dx: E11.9   HYDROcodone -acetaminophen  (NORCO) 7.5-325 MG tablet Take 1-2 tablets by mouth every 6 (six) hours as needed.   MICROLET LANCETS MISC Check blood sugar DX E11.9   nitroGLYCERIN  (NITROSTAT ) 0.4 MG SL tablet Place 1 tablet (0.4 mg total) under the tongue every 5 (five) minutes as needed for chest pain.   sildenafil  (REVATIO ) 20 MG tablet TAKE 1 TO 5 TABLETS BY MOUTH ONCE DAILY AS NEEDED FOR ERECTILE DYSFUNCTION   No facility-administered encounter medications on file as of 12/06/2023.    Allergies (verified) Iodine, Wasp venom, and Iodides   History: Past Medical History:  Diagnosis Date   Atypical chest pain 05/08/2017   Back pain 06/13/2010   Qualifier: Diagnosis of  By: Domenica MD, Harlene Duane back at work in 2002, was able to work until 2004. Ultimately had  Surgeries to his lower back twice and after complications with a spinal leak, was never able to return to work Radicular symptoms occur b/l.    Knee pain, bilateral 08/26/2014   Medicare annual wellness visit, subsequent 05/07/2016   Obesity, unspecified 06/13/2010   Qualifier: Diagnosis of  By: Domenica MD, Harlene     Past Surgical History:  Procedure Laterality Date   BACK SURGERY     X 2   KNEE SURGERY  1999   cartilage repair   Family History  Problem Relation Age of Onset  Cancer Mother        breast   Diabetes Mother    Hyperlipidemia Mother    Hypertension Mother    Stroke Father    Diabetes Father    Hyperlipidemia Father    Hypertension Father    Diabetes Sister    Arthritis Sister    Diabetes Sister    Cancer Maternal Uncle        throat, stomach   Cancer Maternal Grandmother    Arthritis Other        family hx of   Diabetes Other        family hx of   Hypertension Other        family hx of   Stroke Other        family hx of   Sudden death Other        family hx of    Social History   Socioeconomic History   Marital status: Married    Spouse name: Not on file   Number of children: Not on file   Years of education: Not on file   Highest education level: Associate degree: occupational, scientist, product/process development, or vocational program  Occupational History   Not on file  Tobacco Use   Smoking status: Every Day    Current packs/day: 0.50    Types: Cigarettes   Smokeless tobacco: Never  Substance and Sexual Activity   Alcohol use: No   Drug use: No   Sexual activity: Not on file  Other Topics Concern   Not on file  Social History Narrative   Not on file   Social Drivers of Health   Financial Resource Strain: Low Risk  (12/06/2023)   Overall Financial Resource Strain (CARDIA)    Difficulty of Paying Living Expenses: Not hard at all  Food Insecurity: No Food Insecurity (12/06/2023)   Hunger Vital Sign    Worried About Running Out of Food in the Last Year: Never true    Ran Out of Food in the Last Year: Never true  Transportation Needs: No Transportation Needs (12/06/2023)   PRAPARE - Administrator, Civil Service (Medical): No    Lack of Transportation (Non-Medical): No  Physical Activity: Inactive (12/06/2023)   Exercise Vital Sign    Days of Exercise per Week: 0 days    Minutes of Exercise per Session: 0 min  Stress: No Stress Concern Present (12/06/2023)   [employer] of Occupational Health - Occupational Stress Questionnaire    Feeling of Stress : Not at all  Social Connections: Moderately Isolated (12/06/2023)   Social Connection and Isolation Panel [NHANES]    Frequency of Communication with Friends and Family: More than three times a week    Frequency of Social Gatherings with Friends and Family: More than three times a week    Attends Religious Services: Never    Database Administrator or Organizations: No    Attends Engineer, Structural: Never    Marital Status: Married    Tobacco Counseling Ready to quit: Yes Counseling  given: Yes   Clinical Intake:  Pre-visit preparation completed: Yes  Pain : No/denies pain     BMI - recorded: 35.26 Nutritional Status: BMI > 30  Obese Nutritional Risks: None Diabetes: Yes CBG done?: Yes (CBG 207 per patient) CBG resulted in Enter/ Edit results?: Yes Did pt. bring in CBG monitor from home?: No  How often do you need to have someone help you when you read instructions, pamphlets, or  other written materials from your doctor or pharmacy?: 1 - Never  Interpreter Needed?: No  Information entered by :: Rojelio Blush LPN   Activities of Daily Living    12/06/2023   11:45 AM  In your present state of health, do you have any difficulty performing the following activities:  Hearing? 1  Comment Wears Hearing Aids  Vision? 0  Difficulty concentrating or making decisions? 0  Walking or climbing stairs? 0  Dressing or bathing? 0  Doing errands, shopping? 0  Preparing Food and eating ? N  Using the Toilet? N  In the past six months, have you accidently leaked urine? N  Do you have problems with loss of bowel control? N  Managing your Medications? N  Managing your Finances? N  Housekeeping or managing your Housekeeping? N    Patient Care Team: Domenica Harlene LABOR, MD as PCP - General  Indicate any recent Medical Services you may have received from other than [facility] providers in the past year (date may be approximate).     Assessment:   This is a routine wellness examination for Monica.  Hearing/Vision screen Hearing Screening - Comments:: Wears hearing aids Vision Screening - Comments:: Wears reading glasses -Not up to date with routine eye exams with  Deferred   Goals Addressed               This Visit's Progress     Increase physical activity (pt-stated)        Stay Active!       Depression Screen    12/06/2023   11:44 AM 05/01/2023    2:17 PM 10/30/2022    2:14 PM 10/17/2022   10:39 AM 09/28/2021   12:00 PM 09/13/2020    3:06 PM 05/14/2018     1:21 PM  PHQ 2/9 Scores  PHQ - 2 Score 0 0 0 6 1 0 0  PHQ- 9 Score  0 0 12  0     Fall Risk    12/06/2023   11:45 AM 05/01/2023    2:17 PM 10/30/2022    2:14 PM 09/28/2021   11:59 AM 05/14/2018    1:21 PM  Fall Risk   Falls in the past year? 1 0 0 1 No  Number falls in past yr: 0 0 0 1   Injury with Fall? 0 0 0 0   Risk for fall due to : No Fall Risks   History of fall(s)   Follow up Falls prevention discussed Falls evaluation completed Falls evaluation completed Falls prevention discussed     MEDICARE RISK AT HOME: Medicare Risk at Home Any stairs in or around the home?: Yes If so, are there any without handrails?: No Home free of loose throw rugs in walkways, pet beds, electrical cords, etc?: Yes Adequate lighting in your home to reduce risk of falls?: Yes Life alert?: No Use of a cane, walker or w/c?: No Grab bars in the bathroom?: No Shower chair or bench in shower?: Yes Elevated toilet seat or a handicapped toilet?: Yes  TIMED UP AND GO:  Was the test performed?  No    Cognitive Function:        12/06/2023   11:47 AM  6CIT Screen  What Year? 0 points  What month? 0 points  What time? 0 points  Count back from 20 0 points  Months in reverse 0 points  Repeat phrase 0 points  Total Score 0 points    Immunizations Immunization History  Administered  Date(s) Administered   Influenza,inj,Quad PF,6+ Mos 12/24/2014, 10/26/2015, 11/02/2016, 10/30/2022   Pneumococcal Conjugate-13 04/27/2016   Pneumococcal Polysaccharide-23 05/14/2018   Tdap 12/24/2014    TDAP status: Up to date  Flu Vaccine status: Due, Education has been provided regarding the importance of this vaccine. Advised may receive this vaccine at local pharmacy or Health Dept. Aware to provide a copy of the vaccination record if obtained from local pharmacy or Health Dept. Verbalized acceptance and understanding.    Covid-19 vaccine status: Declined, Education has been provided regarding the importance  of this vaccine but patient still declined. Advised may receive this vaccine at local pharmacy or Health Dept.or vaccine clinic. Aware to provide a copy of the vaccination record if obtained from local pharmacy or Health Dept. Verbalized acceptance and understanding.  Qualifies for Shingles Vaccine? Yes   Zostavax completed No   Shingrix Completed?: No.    Education has been provided regarding the importance of this vaccine. Patient has been advised to call insurance company to determine out of pocket expense if they have not yet received this vaccine. Advised may also receive vaccine at local pharmacy or Health Dept. Verbalized acceptance and understanding.  Screening Tests Health Maintenance  Topic Date Due   FOOT EXAM  Never done   OPHTHALMOLOGY EXAM  Never done   HIV Screening  Never done   Hepatitis C Screening  Never done   Colonoscopy  Never done   Diabetic kidney evaluation - Urine ACR  01/28/2020   Zoster Vaccines- Shingrix (1 of 2) Never done   INFLUENZA VACCINE  06/28/2023   COVID-19 Vaccine (1 - 2024-25 season) Never done   HEMOGLOBIN A1C  10/31/2023   Diabetic kidney evaluation - eGFR measurement  04/30/2024   Medicare Annual Wellness (AWV)  12/05/2024   DTaP/Tdap/Td (2 - Td or Tdap) 12/24/2024   HPV VACCINES  Aged Out    Health Maintenance  Health Maintenance Due  Topic Date Due   FOOT EXAM  Never done   OPHTHALMOLOGY EXAM  Never done   HIV Screening  Never done   Hepatitis C Screening  Never done   Colonoscopy  Never done   Diabetic kidney evaluation - Urine ACR  01/28/2020   Zoster Vaccines- Shingrix (1 of 2) Never done   INFLUENZA VACCINE  06/28/2023   COVID-19 Vaccine (1 - 2024-25 season) Never done   HEMOGLOBIN A1C  10/31/2023    Colorectal cancer screening: Referral to GI placed 12/06/23. Pt aware the office will call re: appt.  Lung Cancer Screening: (Low Dose CT Chest recommended if Age 28-80 years, 20 pack-year currently smoking OR have quit w/in  15years.) does qualify.   Lung Cancer Screening Referral: Deferred  Additional Screening:  Hepatitis C Screening: does qualify; Deferred  Vision Screening: Recommended annual ophthalmology exams for early detection of glaucoma and other disorders of the eye. Is the patient up to date with their annual eye exam?  No  Who is the provider or what is the name of the office in which the patient attends annual eye exams? Deferred If pt is not established with a provider, would they like to be referred to a provider to establish care? No .   Dental Screening: Recommended annual dental exams for proper oral hygiene  Diabetic Foot Exam: Diabetic Foot Exam: Overdue, Pt has been advised about the importance in completing this exam. Pt is scheduled for diabetic foot exam on Deferred.  Community Resource Referral / Chronic Care Management:  CRR required this  visit?  No   CCM required this visit?  No     Plan:     I have personally reviewed and noted the following in the patient's chart:   Medical and social history Use of alcohol, tobacco or illicit drugs  Current medications and supplements including opioid prescriptions. Patient is currently taking opioid prescriptions. Information provided to patient regarding non-opioid alternatives. Patient advised to discuss non-opioid treatment plan with their provider. Functional ability and status Nutritional status Physical activity Advanced directives List of other physicians Hospitalizations, surgeries, and ER visits in previous 12 months Vitals Screenings to include cognitive, depression, and falls Referrals and appointments  In addition, I have reviewed and discussed with patient certain preventive protocols, quality metrics, and best practice recommendations. A written personalized care plan for preventive services as well as general preventive health recommendations were provided to patient.     Rojelio LELON Blush, LPN   8/0/7974    After Visit Summary: (MyChart) Due to this being a telephonic visit, the after visit summary with patients personalized plan was offered to patient via MyChart   Nurse Notes: None

## 2023-12-10 ENCOUNTER — Ambulatory Visit (INDEPENDENT_AMBULATORY_CARE_PROVIDER_SITE_OTHER): Payer: Medicare Other | Admitting: Family Medicine

## 2023-12-10 ENCOUNTER — Encounter: Payer: Self-pay | Admitting: Family Medicine

## 2023-12-10 VITALS — BP 145/85 | HR 58 | Ht 72.0 in | Wt 268.0 lb

## 2023-12-10 DIAGNOSIS — R030 Elevated blood-pressure reading, without diagnosis of hypertension: Secondary | ICD-10-CM

## 2023-12-10 DIAGNOSIS — E669 Obesity, unspecified: Secondary | ICD-10-CM | POA: Diagnosis not present

## 2023-12-10 DIAGNOSIS — E782 Mixed hyperlipidemia: Secondary | ICD-10-CM | POA: Diagnosis not present

## 2023-12-10 DIAGNOSIS — Z720 Tobacco use: Secondary | ICD-10-CM | POA: Diagnosis not present

## 2023-12-10 DIAGNOSIS — E1169 Type 2 diabetes mellitus with other specified complication: Secondary | ICD-10-CM

## 2023-12-10 LAB — CBC WITH DIFFERENTIAL/PLATELET
Basophils Absolute: 0.1 10*3/uL (ref 0.0–0.1)
Basophils Relative: 1 % (ref 0.0–3.0)
Eosinophils Absolute: 0.2 10*3/uL (ref 0.0–0.7)
Eosinophils Relative: 3.6 % (ref 0.0–5.0)
HCT: 48.5 % (ref 39.0–52.0)
Hemoglobin: 16.1 g/dL (ref 13.0–17.0)
Lymphocytes Relative: 40.2 % (ref 12.0–46.0)
Lymphs Abs: 2.6 10*3/uL (ref 0.7–4.0)
MCHC: 33.1 g/dL (ref 30.0–36.0)
MCV: 94.3 fL (ref 78.0–100.0)
Monocytes Absolute: 0.5 10*3/uL (ref 0.1–1.0)
Monocytes Relative: 7.2 % (ref 3.0–12.0)
Neutro Abs: 3.1 10*3/uL (ref 1.4–7.7)
Neutrophils Relative %: 48 % (ref 43.0–77.0)
Platelets: 199 10*3/uL (ref 150.0–400.0)
RBC: 5.15 Mil/uL (ref 4.22–5.81)
RDW: 13.8 % (ref 11.5–15.5)
WBC: 6.5 10*3/uL (ref 4.0–10.5)

## 2023-12-10 LAB — LIPID PANEL
Cholesterol: 188 mg/dL (ref 0–200)
HDL: 50.8 mg/dL (ref >39.00)
LDL Cholesterol: 107 mg/dL — ABNORMAL HIGH (ref 0–99)
NonHDL: 137.38
Total CHOL/HDL Ratio: 4
Triglycerides: 154 mg/dL — ABNORMAL HIGH (ref 0.0–149.0)
VLDL: 30.8 mg/dL (ref 0.0–40.0)

## 2023-12-10 LAB — COMPREHENSIVE METABOLIC PANEL
ALT: 22 U/L (ref 0–53)
AST: 14 U/L (ref 0–37)
Albumin: 4.4 g/dL (ref 3.5–5.2)
Alkaline Phosphatase: 63 U/L (ref 39–117)
BUN: 14 mg/dL (ref 6–23)
CO2: 28 meq/L (ref 19–32)
Calcium: 9.3 mg/dL (ref 8.4–10.5)
Chloride: 98 meq/L (ref 96–112)
Creatinine, Ser: 1.04 mg/dL (ref 0.40–1.50)
GFR: 82.72 mL/min (ref >60.00)
Glucose, Bld: 196 mg/dL — ABNORMAL HIGH (ref 70–99)
Potassium: 4.9 meq/L (ref 3.5–5.1)
Sodium: 132 meq/L — ABNORMAL LOW (ref 135–145)
Total Bilirubin: 0.7 mg/dL (ref 0.2–1.2)
Total Protein: 6.7 g/dL (ref 6.0–8.3)

## 2023-12-10 LAB — HEMOGLOBIN A1C: Hgb A1c MFr Bld: 9.2 % — ABNORMAL HIGH (ref 4.6–6.5)

## 2023-12-10 NOTE — Assessment & Plan Note (Signed)
 Blood pressure is not at goal for age and co-morbidities.   Recommendations: follow-up in 2-4 weeks for BP check, monitor at home in the meantime - BP goal <130/80 - monitor and log blood pressures at home - check around the same time each day in a relaxed setting - Limit salt to <2000 mg/day - Follow DASH eating plan (heart healthy diet) - limit alcohol to 2 standard drinks per day for men and 1 per day for women - avoid tobacco products - get at least 2 hours of regular aerobic exercise weekly Patient aware of signs/symptoms requiring further/urgent evaluation. Labs updated today.

## 2023-12-10 NOTE — Assessment & Plan Note (Signed)
 Cessation encouraged

## 2023-12-10 NOTE — Assessment & Plan Note (Signed)
 Medication management: statin (patient unsure which he is taking) Lifestyle factors for lowering cholesterol include: Diet therapy - heart-healthy diet rich in fruits, veggies, fiber-rich whole grains, lean meats, chicken, fish (at least twice a week), fat-free or 1% dairy products; foods low in saturated/trans fats, cholesterol, sodium, and sugar. Mediterranean diet has shown to be very heart healthy. Regular exercise - recommend at least 30 minutes a day, 5 times per week Weight management  Repeat CMP and lipid panel today

## 2023-12-10 NOTE — Patient Instructions (Addendum)
 Blood pressure is not at goal for age and co-morbidities.   Recommendations: follow-up in 2-4 weeks for BP check, monitor at home in the meantime - BP goal <130/80 - monitor and log blood pressures at home - check around the same time each day in a relaxed setting - Limit salt to <2000 mg/day - Follow DASH eating plan (heart healthy diet) - limit alcohol to 2 standard drinks per day for men and 1 per day for women - avoid tobacco products - get at least 2 hours of regular aerobic exercise weekly Patient aware of signs/symptoms requiring further/urgent evaluation. Labs updated today.

## 2023-12-10 NOTE — Progress Notes (Signed)
 Established Patient Office Visit  Subjective   Patient ID: Gary Lacount., male    DOB: 04-Jan-1971  Age: 53 y.o. MRN: [id number]  Chief Complaint  Patient presents with   Medical Management of Chronic Issues    HPI  Discussed the use of AI scribe software for clinical note transcription with the patient, who gave verbal consent to proceed.  History of Present Illness   The patient, with a history of diabetes, arthritis, and chronic back pain, presents for a routine follow-up. He reports persistent pain, particularly in the hips, knees, and back, which he attributes to arthritis and weather changes. The pain is severe enough to affect his mobility, but he prefers to manage it without the use of narcotics, only resorting to his previously prescribed hydrocodone  in extreme circumstances.  He states he is unsure what medications he is taking as his wife manages this for him and she was unable to come to today's appointment - current list includes glimepiride , glyburide , atorvastatin , and pravastatin . He checks his blood sugar approximately once a week or when he feels unwell, and recently noted a high reading. He does not regularly check his blood pressure.  The patient leads an active lifestyle, constantly on the go due to the demands of caring for young grandchildren. He eats once a day, with the quality of his diet varying. He has not previously had issues with blood pressure, but a high reading was noted during this visit, which he speculates could be due to pain and the stress of a long car ride.          BP Readings from Last 3 Encounters:  12/10/23 (!) 145/85  05/01/23 134/82  10/30/22 126/80      ROS All review of systems negative except what is listed in the HPI    Objective:     BP (!) 145/85   Pulse (!) 58   Ht 6' (1.829 m)   Wt 268 lb (121.6 kg)   SpO2 97%   BMI 36.35 kg/m    Physical Exam Vitals reviewed.  Constitutional:      Appearance: Normal  appearance. He is obese.  HENT:     Head: Normocephalic and atraumatic.  Cardiovascular:     Rate and Rhythm: Normal rate and regular rhythm.     Heart sounds: Normal heart sounds.  Pulmonary:     Effort: Pulmonary effort is normal.     Breath sounds: Normal breath sounds.  Musculoskeletal:     Right lower leg: No edema.     Left lower leg: No edema.  Skin:    General: Skin is warm and dry.  Neurological:     Mental Status: He is alert and oriented to person, place, and time.  Psychiatric:        Mood and Affect: Mood normal.        Behavior: Behavior normal.        Thought Content: Thought content normal.        Judgment: Judgment normal.      No results found for any visits on 12/10/23.    The ASCVD Risk score (Arnett DK, et al., 2019) failed to calculate for the following reasons:   Risk score cannot be calculated because patient has a medical history suggesting prior/existing ASCVD    Assessment & Plan:   Problem List Items Addressed This Visit       Active Problems   Mixed hyperlipidemia - Primary   Medication management: statin (  patient unsure which he is taking) Lifestyle factors for lowering cholesterol include: Diet therapy - heart-healthy diet rich in fruits, veggies, fiber-rich whole grains, lean meats, chicken, fish (at least twice a week), fat-free or 1% dairy products; foods low in saturated/trans fats, cholesterol, sodium, and sugar. Mediterranean diet has shown to be very heart healthy. Regular exercise - recommend at least 30 minutes a day, 5 times per week Weight management  Repeat CMP and lipid panel today       Relevant Orders   Comprehensive metabolic panel   Lipid panel   ELEVATED BLOOD PRESSURE WITHOUT DIAGNOSIS OF HYPERTENSION   Blood pressure is not at goal for age and co-morbidities.   Recommendations: follow-up in 2-4 weeks for BP check, monitor at home in the meantime - BP goal <130/80 - monitor and log blood pressures at home -  check around the same time each day in a relaxed setting - Limit salt to <2000 mg/day - Follow DASH eating plan (heart healthy diet) - limit alcohol to 2 standard drinks per day for men and 1 per day for women - avoid tobacco products - get at least 2 hours of regular aerobic exercise weekly Patient aware of signs/symptoms requiring further/urgent evaluation. Labs updated today.       Relevant Orders   CBC with Differential/Platelet   Comprehensive metabolic panel   Hemoglobin A1c   Lipid panel   Type 2 diabetes mellitus with obesity [facility])   Patient unsure of exact regimen. Blood glucose monitoring is inconsistent, approximately once a week or when feeling unwell. -Increase frequency of blood glucose monitoring to daily, fasting. -Labs today. Adjust meds as needed.      Relevant Orders   Comprehensive metabolic panel   Hemoglobin A1c   Lipid panel   Tobacco abuse   Cessation encouraged.        Return in about 2 weeks (around 12/24/2023) for HTN follow-up (can be virtual with PCP/me) 2-4 weeks; routine follow-up 6 months.    Waddell KATHEE Mon, NP

## 2023-12-10 NOTE — Assessment & Plan Note (Signed)
 Patient unsure of exact regimen. Blood glucose monitoring is inconsistent, approximately once a week or when feeling unwell. -Increase frequency of blood glucose monitoring to daily, fasting. -Labs today. Adjust meds as needed.

## 2023-12-11 ENCOUNTER — Other Ambulatory Visit: Payer: Self-pay | Admitting: Neurology

## 2023-12-14 ENCOUNTER — Other Ambulatory Visit: Payer: Self-pay | Admitting: Family Medicine

## 2023-12-14 DIAGNOSIS — E1169 Type 2 diabetes mellitus with other specified complication: Secondary | ICD-10-CM

## 2023-12-14 MED ORDER — OZEMPIC (0.25 OR 0.5 MG/DOSE) 2 MG/3ML ~~LOC~~ SOPN: 0.2500 mg | PEN_INJECTOR | SUBCUTANEOUS | 1 refills | Status: DC

## 2023-12-14 NOTE — Progress Notes (Signed)
Called patient to discuss additional diabetes med options. Reports he had to stop metformin in 2019 due to diarrhea. States he tried insulin for awhile but was having hypoglycemic episodes. He is interested in trying GLP1 if we can get approved. Discussed contraindications, risks, etc. Will send in Ozempic. Keep PCP appointment for next month.

## 2023-12-30 NOTE — Assessment & Plan Note (Signed)
Encouraged DASH diet, decrease po intake and increase exercise as tolerated. Needs 7-8 hours of sleep nightly. Avoid trans fats, eat small, frequent meals every 4-5 hours with lean proteins, complex carbs and healthy fats. Minimize simple carbs, has worked hard and given up all alcohol, all sodas and has cut down his portions.

## 2023-12-30 NOTE — Assessment & Plan Note (Signed)
 Encourage heart healthy diet such as MIND or DASH diet, increase exercise, avoid trans fats, simple carbohydrates and processed foods, consider a krill or fish or flaxseed oil cap daily.

## 2023-12-30 NOTE — Assessment & Plan Note (Signed)
hgba1c acceptable, minimize simple carbs. Increase exercise as tolerated. Continue current med 

## 2024-01-01 ENCOUNTER — Telehealth: Payer: Medicare Other | Admitting: Family Medicine

## 2024-01-01 ENCOUNTER — Encounter: Payer: Self-pay | Admitting: Family Medicine

## 2024-01-01 ENCOUNTER — Other Ambulatory Visit: Payer: Self-pay | Admitting: Family Medicine

## 2024-01-01 DIAGNOSIS — E6609 Other obesity due to excess calories: Secondary | ICD-10-CM | POA: Diagnosis not present

## 2024-01-01 DIAGNOSIS — E669 Obesity, unspecified: Secondary | ICD-10-CM

## 2024-01-01 DIAGNOSIS — E1169 Type 2 diabetes mellitus with other specified complication: Secondary | ICD-10-CM

## 2024-01-01 DIAGNOSIS — E782 Mixed hyperlipidemia: Secondary | ICD-10-CM | POA: Diagnosis not present

## 2024-01-01 MED ORDER — PRAVASTATIN SODIUM 20 MG PO TABS: 20.0000 mg | ORAL_TABLET | Freq: Every day | ORAL | 1 refills | Status: DC

## 2024-01-01 MED ORDER — VALSARTAN 40 MG PO TABS: 40.0000 mg | ORAL_TABLET | Freq: Every day | ORAL | 3 refills | Status: DC

## 2024-01-01 NOTE — Progress Notes (Signed)
MyChart Video Visit    Virtual Visit via Video Note   This patient is at least at moderate risk for complications without adequate follow up. This format is felt to be most appropriate for this patient at this time. Physical exam was limited by quality of the video and audio technology used for the visit. Juanetta, CMA was able to get the patient set up on a video visit.  Patient location: home Patient and provider in visit Provider location: Office  I discussed the limitations of evaluation and management by telemedicine and the availability of in person appointments. The patient expressed understanding and agreed to proceed.  Visit Date: 01/01/2024  Today's healthcare provider: Danise Edge, MD     Subjective:    Patient ID: Gary Center., male    DOB: 1971-03-04, 53 y.o.   MRN: [id number]  Chief Complaint  Patient presents with   Follow-up    HPI Discussed the use of AI scribe software for clinical note transcription with the patient, who gave verbal consent to proceed.  History of Present Illness   The patient, with a history of hypertension and diabetes, presents with persistently high blood pressure and blood sugar levels. He reports blood pressure readings ranging from 130 to 178 systolic and 80 to 90s diastolic. His blood sugar levels have been consistently between 150 and 200, with a few instances of readings over 200. lowest number was 100. The patient has been taking glyburide 1.5mg  and pravastatin for his conditions. He reports experiencing hot flashes and a sensation of his ears being on fire, which he suspects may be related to his blood pressure or blood sugar levels. The patient has been diligent in monitoring his blood pressure and blood sugar levels, and has been taking his medications regularly. He expresses concern about his health and a desire to improve his numbers.        Past Medical History:  Diagnosis Date   Atypical chest pain 05/08/2017   Back  pain 06/13/2010   Qualifier: Diagnosis of  By: Abner Greenspan MD, Eldridge Dace back at work in 2002, was able to work until 2004. Ultimately had  Surgeries to his lower back twice and after complications with a spinal leak, was never able to return to work Radicular symptoms occur b/l.    Knee pain, bilateral 08/26/2014   Medicare annual wellness visit, subsequent 05/07/2016   Obesity, unspecified 06/13/2010   Qualifier: Diagnosis of  By: Abner Greenspan MD, Misty Stanley      Past Surgical History:  Procedure Laterality Date   BACK SURGERY     X 2   KNEE SURGERY  1999   cartilage repair    Family History  Problem Relation Age of Onset   Cancer Mother        breast   Diabetes Mother    Hyperlipidemia Mother    Hypertension Mother    Stroke Father    Diabetes Father    Hyperlipidemia Father    Hypertension Father    Diabetes Sister    Arthritis Sister    Diabetes Sister    Cancer Maternal Uncle        throat, stomach   Cancer Maternal Grandmother    Arthritis Other        family hx of   Diabetes Other        family hx of   Hypertension Other        family hx of   Stroke Other  family hx of   Sudden death Other        family hx of    Social History   Socioeconomic History   Marital status: Married    Spouse name: Not on file   Number of children: Not on file   Years of education: Not on file   Highest education level: Associate degree: occupational, Scientist, product/process development, or vocational program  Occupational History   Not on file  Tobacco Use   Smoking status: Every Day    Current packs/day: 0.50    Types: Cigarettes   Smokeless tobacco: Never  Substance and Sexual Activity   Alcohol use: No   Drug use: No   Sexual activity: Not on file  Other Topics Concern   Not on file  Social History Narrative   Not on file   Social Drivers of Health   Financial Resource Strain: Low Risk  (12/06/2023)   Overall Financial Resource Strain (CARDIA)    Difficulty of Paying Living Expenses: Not hard at  all  Food Insecurity: No Food Insecurity (12/06/2023)   Hunger Vital Sign    Worried About Running Out of Food in the Last Year: Never true    Ran Out of Food in the Last Year: Never true  Transportation Needs: No Transportation Needs (12/06/2023)   PRAPARE - Administrator, Civil Service (Medical): No    Lack of Transportation (Non-Medical): No  Physical Activity: Inactive (12/06/2023)   Exercise Vital Sign    Days of Exercise per Week: 0 days    Minutes of Exercise per Session: 0 min  Stress: No Stress Concern Present (12/06/2023)   [employer] of Occupational Health - Occupational Stress Questionnaire    Feeling of Stress : Not at all  Social Connections: Moderately Isolated (12/06/2023)   Social Connection and Isolation Panel [NHANES]    Frequency of Communication with Friends and Family: More than three times a week    Frequency of Social Gatherings with Friends and Family: More than three times a week    Attends Religious Services: Never    Database administrator or Organizations: No    Attends Banker Meetings: Never    Marital Status: Married  Catering manager Violence: Not At Risk (12/06/2023)   Humiliation, Afraid, Rape, and Kick questionnaire    Fear of Current or Ex-Partner: No    Emotionally Abused: No    Physically Abused: No    Sexually Abused: No    Outpatient Medications Prior to Visit  Medication Sig Dispense Refill   aspirin EC 81 MG tablet Take 1 tablet (81 mg total) by mouth daily.     glucose blood (BAYER CONTOUR TEST) test strip Check blood glucose once daily and as needed. Dx: E11.9 100 each 5   glyBURIDE micronized (GLYNASE) 1.5 MG tablet Take 1 tablet (1.5 mg total) by mouth daily with breakfast. 90 tablet 2   HYDROcodone-acetaminophen (NORCO) 7.5-325 MG tablet Take 1-2 tablets by mouth every 6 (six) hours as needed. 30 tablet 0   MICROLET LANCETS MISC Check blood sugar DX E11.9 100 each 5   nitroGLYCERIN (NITROSTAT) 0.4 MG SL tablet  Place 1 tablet (0.4 mg total) under the tongue every 5 (five) minutes as needed for chest pain. 25 tablet 1   Semaglutide,0.25 or 0.5MG /DOS, (OZEMPIC, 0.25 OR 0.5 MG/DOSE,) 2 MG/3ML SOPN Inject 0.25 mg into the skin once a week. 3 mL 1   sildenafil (REVATIO) 20 MG tablet TAKE 1 TO 5 TABLETS  BY MOUTH ONCE DAILY AS NEEDED FOR ERECTILE DYSFUNCTION 30 tablet 0   pravastatin (PRAVACHOL) 10 MG tablet Take 1 tablet (10 mg total) by mouth daily. 90 tablet 2   No facility-administered medications prior to visit.    Allergies  Allergen Reactions   Iodine Shortness Of Breath    Per pt "I felt like I was going to die"   Wasp Venom Swelling    syncope   Iodides     Review of Systems  Constitutional:  Positive for malaise/fatigue. Negative for fever.  HENT:  Negative for congestion.   Eyes:  Negative for blurred vision.  Respiratory:  Negative for shortness of breath.   Cardiovascular:  Negative for chest pain, palpitations and leg swelling.  Gastrointestinal:  Negative for abdominal pain, blood in stool and nausea.  Genitourinary:  Negative for dysuria and frequency.  Musculoskeletal:  Positive for back pain. Negative for falls.  Skin:  Negative for rash.  Neurological:  Negative for dizziness, loss of consciousness and headaches.  Endo/Heme/Allergies:  Negative for environmental allergies.  Psychiatric/Behavioral:  Negative for depression. The patient is not nervous/anxious.       Objective:    Physical Exam Constitutional:      General: He is not in acute distress.    Appearance: Normal appearance. He is not ill-appearing or toxic-appearing.  HENT:     Head: Normocephalic and atraumatic.     Right Ear: External ear normal.     Left Ear: External ear normal.     Nose: Nose normal.  Eyes:     General:        Right eye: No discharge.        Left eye: No discharge.  Pulmonary:     Effort: Pulmonary effort is normal.  Skin:    Findings: No rash.  Neurological:     Mental Status:  He is alert and oriented to person, place, and time.  Psychiatric:        Behavior: Behavior normal.   There were no vitals taken for this visit. Wt Readings from Last 3 Encounters:  12/10/23 268 lb (121.6 kg)  12/06/23 260 lb (117.9 kg)  05/01/23 260 lb (117.9 kg)       Assessment & Plan:  Mixed hyperlipidemia Assessment & Plan: Encourage heart healthy diet such as MIND or DASH diet, increase exercise, avoid trans fats, simple carbohydrates and processed foods, consider a krill or fish or flaxseed oil cap daily.     Type 2 diabetes mellitus with obesity (HCC) Assessment & Plan: hgba1c acceptable, minimize simple carbs. Increase exercise as tolerated. Continue current med    Obesity due to excess calories with serious comorbidity, unspecified class Assessment & Plan: Encouraged DASH diet, decrease po intake and increase exercise as tolerated. Needs 7-8 hours of sleep nightly. Avoid trans fats, eat small, frequent meals every 4-5 hours with lean proteins, complex carbs and healthy fats. Minimize simple carbs, has worked hard and given up all alcohol, all sodas and has cut down his portions.    Other orders -     Valsartan; Take 1 tablet (40 mg total) by mouth daily.  Dispense: 30 tablet; Refill: 3 -     Pravastatin Sodium; Take 1 tablet (20 mg total) by mouth daily.  Dispense: 90 tablet; Refill: 1     Assessment and Plan    Hypertension Persistent elevated blood pressure readings, ranging from 130-170 systolic and 80-90 diastolic. -Initiate Valsartan 40mg  daily. -Monitor blood pressure regularly and report readings  to the office.  Type 2 Diabetes Mellitus Fasting blood glucose levels consistently between 150-200, with occasional readings up to 256. Currently on Glyburide 1.5mg  daily. -Initiate Semaglutide (Ozempic) once weekly, starting with the lowest dose for the first 4-8 weeks. -Continue Glyburide 1.5mg  daily, but discontinue if hypoglycemia occurs. -Encourage regular  protein intake every 3-4 hours to prevent blood sugar spikes and drops. -Check blood glucose regularly and report readings to the office.  Hyperlipidemia Total cholesterol of 188 on Pravastatin 10mg  daily. -Increase Pravastatin to 20mg  daily. -Repeat lipid panel in April 2025.  Follow-up plans -Schedule a virtual visit in 8-12 weeks to assess response to new medications. -Schedule an in-person visit in approximately 6 months (July/August 2025). -Complete blood work in mid-April 2025 to assess metabolic control.         I discussed the assessment and treatment plan with the patient. The patient was provided an opportunity to ask questions and all were answered. The patient agreed with the plan and demonstrated an understanding of the instructions.   The patient was advised to call back or seek an in-person evaluation if the symptoms worsen or if the condition fails to improve as anticipated.  Danise Edge, MD [facility] [phone] (phone) [phone] (fax)  [facility]

## 2024-01-03 ENCOUNTER — Encounter: Payer: Self-pay | Admitting: Family Medicine

## 2024-01-03 NOTE — Telephone Encounter (Signed)
 ERROR

## 2024-01-07 ENCOUNTER — Other Ambulatory Visit: Payer: Self-pay | Admitting: Family Medicine

## 2024-01-07 ENCOUNTER — Telehealth: Payer: Self-pay

## 2024-01-07 DIAGNOSIS — E1169 Type 2 diabetes mellitus with other specified complication: Secondary | ICD-10-CM

## 2024-01-07 NOTE — Progress Notes (Signed)
 Care Guide Pharmacy Note  01/07/2024 Name: Gary Short. MRN: [id number] DOB: 10-29-1971  Referred By: Neda Balk, MD Reason for referral: Care Coordination (Outreach to schedule with Pharm d )   Gary Short. is a 53 y.o. year old male who is a primary care patient of Neda Balk, MD.  Gary Short. was referred to the pharmacist for assistance related to: DMII  Successful contact was made with the patient to discuss pharmacy services including being ready for the pharmacist to call at least 5 minutes before the scheduled appointment time and to have medication bottles and any blood pressure readings ready for review. The patient agreed to meet with the pharmacist via telephone visit on (date/time).01/14/2024  Lenton Rail , RMA     [place]  [facility], [facility] Guide  Direct Dial: [phone]  Website: [email or web address]

## 2024-01-08 NOTE — Telephone Encounter (Signed)
Called patient and he has an appointment set up for next Monday.

## 2024-01-14 ENCOUNTER — Ambulatory Visit (INDEPENDENT_AMBULATORY_CARE_PROVIDER_SITE_OTHER): Payer: Medicare Other | Admitting: Pharmacist

## 2024-01-14 ENCOUNTER — Encounter: Payer: Self-pay | Admitting: Pharmacist

## 2024-01-14 DIAGNOSIS — E1169 Type 2 diabetes mellitus with other specified complication: Secondary | ICD-10-CM

## 2024-01-14 DIAGNOSIS — E669 Obesity, unspecified: Secondary | ICD-10-CM

## 2024-01-14 MED ORDER — GLYBURIDE MICRONIZED 1.5 MG PO TABS: ORAL_TABLET | ORAL | 1 refills | Status: DC

## 2024-01-14 MED ORDER — VALSARTAN 80 MG PO TABS: 80.0000 mg | ORAL_TABLET | Freq: Every day | ORAL | 1 refills | Status: DC

## 2024-01-14 MED ORDER — OZEMPIC (0.25 OR 0.5 MG/DOSE) 2 MG/3ML ~~LOC~~ SOPN: 0.2500 mg | PEN_INJECTOR | SUBCUTANEOUS | Status: DC

## 2024-01-14 NOTE — Progress Notes (Signed)
 01/14/2024 Name: Gary Short. MRN: [id number] DOB: 1971/10/17  Chief Complaint  Patient presents with   Diabetes   Medication Management   Hyperlipidemia   Hypertension    Gary Short. is a 53 y.o. year old male who presented for a telephone visit. Married with 3 children at home. Patient has been disabled since around 2003. His wife is also disabled. Neither him or his wife has coverage for medications. His children has Medicaid coverage.    They were referred to the pharmacist by their PCP for assistance in managing diabetes and medication access.    Subjective:  Care Team: Primary Care Provider: [name], MD ; Next Scheduled Visit: 04/01/2024  Medication Access/Adherence  Current Pharmacy:  [facility] [address], [place] - [address] [place] [address] Phone: [phone] Fax: [phone]  [facility] [address], [place] - [address] [place] [address] Phone: [phone] Fax: [phone]   Patient reports affordability concerns with their medications: Yes  - patient only had Medicare A and B - no prescription coverage.  Patient reports access/transportation concerns to their pharmacy:  sometimes has car problems but can find a way to get to pharmacy or medical visits . Patient reports adherence concerns with their medications:  Yes      Diabetes / Obesity  Wt Readings from Last 3 Encounters:  12/10/23 268 lb (121.6 kg)  12/06/23 260 lb (117.9 kg)  05/01/23 260 lb (117.9 kg)   BMI = 36.35  Current medications: glyburide 1.5mg  once daily with morning meal Prescribed Ozempic 0.25mg  weekly for 4 weeks, then 0.5mg  weekly thereafter but has not started yet due to cost.   Medications tried in the past: metformin - stopped due to diarrhea; Novolin N insulin - stopped due to hypoglycemia  Current glucose readings: checks blood glucose 1 to 2 times per day. Blood glucose usually  190's to 200. Highest is FBG in the morning.   Patient denies hypoglycemic s/sx including no dizziness, shakiness, sweating. Patient reports hyperglycemic symptoms - gets dizziness and flushing / ears red when blood glucose is high - he has been experiencing this some.   Current meal patterns:  No fried foods recently No pasta Likes collards and cabbage - but doesn't like many other vegetables Breakfast - eggs, rice or grits; sometimes bacon Lunch - meat and potatoes Dinner - pork chops, chicken, potatoes Drinks - water; trying to avoid sodas now.   Current physical activity: has chronic back pain which limits activity.  Current medication access support: none currently  Hypertension:  Current medications: valsartan 40mg  daily   Medications previously tried: none  Patient has a validated, automated, upper arm home BP cuff Current blood pressure readings readings: 150 to 180 usually except the most recently the lowest has been 138 but has only been this low once.   Patient denies hypotensive s/sx including no dizziness, lightheadedness.  Patient denies hypertensive symptoms including no headache, chest pain, shortness of breath but he is worried about having another stroke and he is motivated to control blood glucose, blood pressure and cholesterol.   BP Readings from Last 3 Encounters:  12/10/23 (!) 145/85  05/01/23 134/82  10/30/22 126/80     Hyperlipidemia/ASCVD Risk Reduction  Current lipid lowering medications: pravastatin 20mg  daily (increased from 10mg  daily 01/02/2024  Medications tried in the past: atorvastatin - unsure why stopped / changed   Antiplatelet regimen: aspirin 81mg   daily  ASCVD History: TIA Risk Factors:  diabetes, uncontrolled blood pressure, elevated LDL and Tg, smoker   Objective:  Lab Results  Component Value Date   HGBA1C 9.2 (H) 12/10/2023    Lab Results  Component Value Date   CREATININE 1.04 12/10/2023   BUN 14 12/10/2023   NA  132 (L) 12/10/2023   K 4.9 12/10/2023   CL 98 12/10/2023   CO2 28 12/10/2023    Lab Results  Component Value Date   CHOL 188 12/10/2023   HDL 50.80 12/10/2023   LDLCALC 107 (H) 12/10/2023   LDLDIRECT 117.0 10/30/2022   TRIG 154.0 (H) 12/10/2023   CHOLHDL 4 12/10/2023    Medications Reviewed Today     Reviewed by Henrene Pastor, RPH-CPP (Pharmacist) on 01/14/24 at 1047  Med List Status: <None>   Medication Order Taking? Sig Documenting Provider Last Dose Status Informant  aspirin EC 81 MG tablet [id number] Yes Take 1 tablet (81 mg total) by mouth daily. [name], MD Taking Active   glucose blood (BAYER CONTOUR TEST) test strip [id number] Yes Check blood glucose once daily and as needed. Dx: E11.9 [name], MD Taking Active   glyBURIDE micronized (GLYNASE) 1.5 MG tablet [id number] Yes Take 1 tablet (1.5 mg total) by mouth daily with breakfast. [name], MD Taking Active   HYDROcodone-acetaminophen [facility]) 7.5-325 MG tablet [id number] Yes Take 1-2 tablets by mouth every 6 (six) hours as needed. [name], MD Taking Active   MICROLET LANCETS MISC [id number] Yes Check blood sugar DX E11.9 [name], MD Taking Active   nitroGLYCERIN (NITROSTAT) 0.4 MG SL tablet [id number] Yes Place 1 tablet (0.4 mg total) under the tongue every 5 (five) minutes as needed for chest pain. [name], MD Taking Active   pravastatin (PRAVACHOL) 20 MG tablet [id number] Yes Take 1 tablet (20 mg total) by mouth daily. [name], MD Taking Active   Semaglutide,0.25 or 0.5MG /DOS, (OZEMPIC, 0.25 OR 0.5 MG/DOSE,) 2 MG/3ML SOPN [id number] No Inject 0.25 mg into the skin once a week.  Patient not taking: Reported on 01/14/2024   Clayborne Dana, NP Not Taking Active   sildenafil (REVATIO) 20 MG tablet [id number] Yes TAKE 1 TO 5 TABLETS BY MOUTH ONCE DAILY AS NEEDED FOR ERECTILE DYSFUNCTION [name], MD Taking Active   valsartan (DIOVAN) 40 MG tablet [id number] Yes Take 1 tablet (40  mg total) by mouth daily. [name], MD Taking Active               Assessment/Plan:   Diabetes: - Currently uncontrolled - A1c goal < 7.0%; would like to see weight loss of 10 to 20% of current weight.  - Reviewed long term cardiovascular and renal outcomes of uncontrolled blood sugar - Reviewed goal A1c, goal fasting, and goal 2 hour post prandial glucose - Reviewed dietary modifications including limiting in take of foods that contain sugar and high CHO foods. Discussed increasing low CHO vegetables.  - Recommend to increaesd glyburide to 1.5mg  with breakfast and 0.75mg  with evening meal until he is able to start Ozempic. Left a sample of Ozempic for patient - to start with 0.25mg  weekly for 4 weeks, then increase to 0.5mg  weekly thereafter.  - Patient denies personal or family history of multiple endocrine neoplasia type 2, medullary thyroid cancer; personal history of pancreatitis or gallbladder disease. - Recommend to check glucose 1 to 2 times per day - Meets financial criteria for Ozempic patient assistance program  through [employer]. Started application process today.  - Also provided number for Medicaid representative [phone] for patient to call to see if he and his wife might qualify for [facility] Medicaid. If he does, then will cancel Novo / Ozempic application for medication assistance program.    Hypertension: - Currently uncontrolled blood pressure goal < 130/80 - Reviewed long term cardiovascular and renal outcomes of uncontrolled blood pressure - Reviewed appropriate blood pressure monitoring technique and reviewed goal blood pressure. Recommended to check home blood pressure and heart rate once daily - Recommend to increase valsartan to 80mg  daily   Hyperlipidemia/ASCVD Risk Reduction: - Currently uncontrolled. LDL goal < 70 and Tg < 150 - Reviewed long term complications of uncontrolled cholesterol - Recommend to contnue pravastatin 20mg  daily - consider  more potent statin as needed if LDL not at goal of < 70    Follow Up Plan: 2 weeks  Henrene Pastor, PharmD Clinical Pharmacist [facility]

## 2024-01-31 ENCOUNTER — Encounter: Payer: Self-pay | Admitting: Family Medicine

## 2024-02-07 ENCOUNTER — Other Ambulatory Visit: Payer: Self-pay | Admitting: Pharmacist

## 2024-02-07 NOTE — Progress Notes (Signed)
 02/07/2024 Name: Gary Short. MRN: [id number] DOB: [birth year]  Chief Complaint  Patient presents with   Diabetes   Medication Management    Gary Short. is a 53 y.o. year old male who presented for a telephone visit. Married with 3 children at home. Patient has been disabled since around 2003. His wife is also disabled. Neither him nor his wife have coverage for medications. His children have Medicaid coverage.    They were referred to the pharmacist by their PCP for assistance in managing diabetes and medication access.    Subjective:  Care Team: Primary Care Provider: [name], MD ; Next Scheduled Visit: 04/01/2024  Medication Access/Adherence  Current Pharmacy:  [facility] [address], [place] - [address] [place] [address] Phone: [phone] Fax: [phone]  [facility] [address] [place] - [address] [place] [address] Phone: [phone] Fax: [phone]   Patient reports affordability concerns with their medications: Yes  - patient only had Medicare A and B - no prescription coverage. He has been approved to received Ozempic thru 12.31.2025 form [employer] medication assistance program.  Patient reports access/transportation concerns to their pharmacy:  sometimes has car problems but can find a way to get to pharmacy or medical visits . He lives about 3 hours away from our clinic in [place]. Patient reports adherence concerns with their medications:  Yes      Diabetes / Obesity  Wt Readings from Last 3 Encounters:  12/10/23 268 lb (121.6 kg)  12/06/23 260 lb (117.9 kg)  05/01/23 260 lb (117.9 kg)   BMI = 36.35  Current medications: glyburide 1.5mg  once daily with morning meal and Ozmepic 0.25mg  weekly fro 4 weeks, then 0.5mg  weekly thereafter - Started around 01/17/2024  Reports a little worsening of heartburn lately. He also had constipation the first week he  started Ozempic but constipation has resolved.   Medications tried in the past: metformin - stopped due to diarrhea; Novolin N insulin - stopped due to hypoglycemia  Current glucose readings: checks blood glucose 1 to 2 times per day.  Blood glucose prior to starting Ozempic was 190's to 200. This morning was 113. Report blood glucose has improved to mostly around 140 to 150's.   Patient denies hypoglycemic s/sx including no dizziness, shakiness, sweating. Patient reports hyperglycemic symptoms - gets dizziness and flushing / ears red when blood glucose is high - he has not had these symptoms in the last 2 weeks.    Current meal patterns: (discussed at previous visit)  No fried foods recently No pasta Likes collards and cabbage - but doesn't like many other vegetables Breakfast - eggs, rice or grits; sometimes bacon Lunch - meat and potatoes Dinner - pork chops, chicken, potatoes Drinks - water; trying to avoid sodas now.   Current physical activity: has chronic back pain which limits activity.  Current medication access support: none currently  Hypertension:  Current medications: valsartan 80mg  daily (does increased at out last visit)  Medications previously tried: none  Patient has a validated, automated, upper arm home BP cuff Current blood pressure readings readings: 130/89 when checked yesterday at home.   Patient denies hypotensive s/sx including no dizziness, lightheadedness.  Patient denies hypertensive symptoms including no headache, chest pain, shortness of breath but he is worried about having another stroke and he is motivated to control blood glucose, blood pressure and cholesterol.   BP  Readings from Last 3 Encounters:  12/10/23 (!) 145/85  05/01/23 134/82  10/30/22 126/80     Hyperlipidemia/ASCVD Risk Reduction  Current lipid lowering medications: pravastatin 20mg  daily (increased from 10mg  daily 01/02/2024)  Medications tried in the past: atorvastatin -  unsure why stopped / changed   Antiplatelet regimen: aspirin 81mg  daily  ASCVD History: TIA Risk Factors:  diabetes, uncontrolled blood pressure, elevated LDL and Tg, smoker   Objective:  Lab Results  Component Value Date   HGBA1C 9.2 (H) 12/10/2023    Lab Results  Component Value Date   CREATININE 1.04 12/10/2023   BUN 14 12/10/2023   NA 132 (L) 12/10/2023   K 4.9 12/10/2023   CL 98 12/10/2023   CO2 28 12/10/2023    Lab Results  Component Value Date   CHOL 188 12/10/2023   HDL 50.80 12/10/2023   LDLCALC 107 (H) 12/10/2023   LDLDIRECT 117.0 10/30/2022   TRIG 154.0 (H) 12/10/2023   CHOLHDL 4 12/10/2023    Medications Reviewed Today     Reviewed by Henrene Pastor, RPH-CPP (Pharmacist) on 02/07/24 at 1008  Med List Status: <None>   Medication Order Taking? Sig Documenting Provider Last Dose Status Informant  aspirin EC 81 MG tablet [id number] Yes Take 1 tablet (81 mg total) by mouth daily. [name], MD Taking Active   glucose blood (BAYER CONTOUR TEST) test strip [id number]  Check blood glucose once daily and as needed. Dx: E11.9 [name], MD  Active   glyBURIDE micronized (GLYNASE) 1.5 MG tablet [id number] Yes Take 1 tablet with morning meal and 0.5 tablet with evening meal. [name], MD Taking Active   HYDROcodone-acetaminophen [facility]) 7.5-325 MG tablet [id number]  Take 1-2 tablets by mouth every 6 (six) hours as needed. [name], MD  Active   MICROLET LANCETS MISC [id number]  Check blood sugar DX E11.9 [name], MD  Active   nitroGLYCERIN (NITROSTAT) 0.4 MG SL tablet [id number]  Place 1 tablet (0.4 mg total) under the tongue every 5 (five) minutes as needed for chest pain. [name], MD  Active   pravastatin (PRAVACHOL) 20 MG tablet [id number] Yes Take 1 tablet (20 mg total) by mouth daily. [name], MD Taking Active   Semaglutide,0.25 or 0.5MG /DOS, (OZEMPIC, 0.25 OR 0.5 MG/DOSE,) 2 MG/3ML SOPN [id number] Yes Inject 0.25 mg into  the skin once a week. [name], MD Taking Active   sildenafil (REVATIO) 20 MG tablet [id number] Yes TAKE 1 TO 5 TABLETS BY MOUTH ONCE DAILY AS NEEDED FOR ERECTILE DYSFUNCTION [name], MD Taking Active   valsartan (DIOVAN) 80 MG tablet [id number] Yes Take 1 tablet (80 mg total) by mouth daily. [name], MD Taking Active               Assessment/Plan:   Diabetes / Obesity: Currently uncontrolled based on last A1c but home blood glucose has improved since starting Ozempic.  A1c goal < 7.0%; would like to see weight loss of 10 to 20% of current weight.  - Reviewed goal A1c, goal fasting, and goal 2 hour post prandial glucose  - Recommend continue glyburide to 1.5mg  with breakfast and continue Ozempic 0.25mg  weekly.  - regarding increase in heartburn. Reminded patient to be mindful when eating to stop when he feel full. He can use Tums a few times per week if needed but if he finds he needs weekly, should call office to have video visit with Dr Abner Greenspan to discuss.  -  Recommend to check glucose 1 to 2 times per day  Hypertension: - Currently uncontrolled blood pressure goal < 130/80 - Reviewed long term cardiovascular and renal outcomes of uncontrolled blood pressure - Continue valsartan to 80mg  daily   Hyperlipidemia/ASCVD Risk Reduction: Currently uncontrolled. LDL goal < 70 and Tg < 150 - Reviewed long term complications of uncontrolled cholesterol - Recommend to contnue pravastatin 20mg  daily - consider more potent statin as needed if LDL not at goal of < 70    Follow Up Plan: 2 to 4 weeks.   Henrene Pastor, PharmD Clinical Pharmacist [facility]

## 2024-02-12 ENCOUNTER — Other Ambulatory Visit: Payer: Self-pay | Admitting: Family Medicine

## 2024-03-04 ENCOUNTER — Other Ambulatory Visit: Payer: Self-pay | Admitting: Pharmacist

## 2024-03-04 NOTE — Progress Notes (Unsigned)
 Marland Kitchen

## 2024-03-06 ENCOUNTER — Other Ambulatory Visit: Payer: Self-pay | Admitting: Pharmacist

## 2024-03-06 DIAGNOSIS — E119 Type 2 diabetes mellitus without complications: Secondary | ICD-10-CM

## 2024-03-06 DIAGNOSIS — E782 Mixed hyperlipidemia: Secondary | ICD-10-CM

## 2024-03-06 DIAGNOSIS — E669 Obesity, unspecified: Secondary | ICD-10-CM

## 2024-03-06 NOTE — Progress Notes (Signed)
 03/06/2024 Name: Gary Short. MRN: [id number] DOB: 1970/12/29  Chief Complaint  Patient presents with   Diabetes    Gary Short. is a 53 y.o. year old male who presented for a telephone visit. Married with 3 children at home. Patient has been disabled since around 2003. His wife is also disabled. Neither him nor his wife have coverage for medications. His children have Medicaid coverage.    They were referred to the pharmacist by their PCP for assistance in managing diabetes and medication access.    Subjective:  Care Team: Primary Care Provider: [name], MD ; Next Scheduled Visit: 04/01/2024  Medication Access/Adherence  Current Pharmacy:  [facility] [address], [place] - [address] [place] [address] Phone: [phone] Fax: [phone]  [facility] [address] [place] - [address] [place] [address] Phone: [phone] Fax: [phone]   Patient reports affordability concerns with their medications: Yes  - patient only had Medicare A and B - no prescription coverage. He has been approved to received Ozempic thru 11/26/2024 form [employer] medication assistance program.  Patient reports access/transportation concerns to their pharmacy:  sometimes has car problems but can find a way to get to pharmacy or medical visits . He lives about 3 hours away from our clinic in [place]. Patient reports adherence concerns with their medications:  Yes      Diabetes / Obesity  Wt Readings from Last 3 Encounters:  12/10/23 268 lb (121.6 kg)  12/06/23 260 lb (117.9 kg)  05/01/23 260 lb (117.9 kg)   BMI = 36.35  Current medications: glyburide 1.5mg  once daily with morning meal and Ozmepic 0.5mg  weekly - Started Ozempic around 01/17/2024, increased dose to 0.5mg  about 2 weeks ago.   Patient reports some diarrhea, burping and feels gassy. He has been taking over-the-counter medication for  acid reflux - Tums or Pepcid He feels like each week he is tolerating Ozempic a little better.   Medications tried in the past: metformin - stopped due to diarrhea; Novolin N insulin - stopped due to hypoglycemia  Current glucose readings: checks blood glucose 1 to 2 times per day.  Blood glucose prior to starting Ozempic was 190's to 200.  Report blood glucose has improved to mostly around 120 to 130's with 0.5mg  dose of Ozempic.   Patient denies hypoglycemic s/sx including no dizziness, shakiness, sweating. Patient denies hyperglycemic symptoms -   Current meal patterns: (discussed at previous visit)  No fried foods recently No pasta Likes collards and cabbage - but doesn't like many other vegetables Breakfast - eggs, rice or grits; sometimes bacon Lunch - meat and potatoes Dinner - pork chops, chicken, potatoes Drinks - water; trying to avoid sodas now.   Current physical activity: has chronic back pain which limits activity.  Current medication access support: none currently  Hypertension:  Current medications: valsartan 80mg  daily (does increased at our last visit)  Medications previously tried: none  Patient has a validated, automated, upper arm home BP cuff Current blood pressure readings readings: no readings reported today.   Patient denies hypotensive s/sx including no dizziness, lightheadedness.  Patient denies hypertensive symptoms including no headache, chest pain, shortness of breath.    BP Readings from Last 3 Encounters:  12/10/23 (!) 145/85  05/01/23 134/82  10/30/22 126/80     Hyperlipidemia/ASCVD Risk Reduction  Current lipid lowering medications: pravastatin 20mg  daily (increased from 10mg   daily 01/02/2024)  Medications tried in the past: atorvastatin - unsure why stopped / changed   Antiplatelet regimen: aspirin 81mg  daily  ASCVD History: TIA Risk Factors:  diabetes, uncontrolled blood pressure, elevated LDL and Tg,  smoker   Objective:  Lab Results  Component Value Date   HGBA1C 9.2 (H) 12/10/2023    Lab Results  Component Value Date   CREATININE 1.04 12/10/2023   BUN 14 12/10/2023   NA 132 (L) 12/10/2023   K 4.9 12/10/2023   CL 98 12/10/2023   CO2 28 12/10/2023    Lab Results  Component Value Date   CHOL 188 12/10/2023   HDL 50.80 12/10/2023   LDLCALC 107 (H) 12/10/2023   LDLDIRECT 117.0 10/30/2022   TRIG 154.0 (H) 12/10/2023   CHOLHDL 4 12/10/2023    Medications Reviewed Today     Reviewed by Gary Short, RPH-CPP (Pharmacist) on 03/06/24 at 1051  Med List Status: <None>   Medication Order Taking? Sig Documenting Provider Last Dose Status Informant  aspirin EC 81 MG tablet [id number] Yes Take 1 tablet (81 mg total) by mouth daily. [name], MD Taking Active   glucose blood (BAYER CONTOUR TEST) test strip [id number] Yes Check blood glucose once daily and as needed. Dx: E11.9 [name], MD Taking Active   glyBURIDE micronized (GLYNASE) 1.5 MG tablet [id number] Yes Take 1 tablet with morning meal and 0.5 tablet with evening meal. [name], MD Taking Active   HYDROcodone-acetaminophen [facility]) 7.5-325 MG tablet [id number] Yes Take 1-2 tablets by mouth every 6 (six) hours as needed. [name], MD Taking Active   MICROLET LANCETS MISC [id number] Yes Check blood sugar DX E11.9 [name], MD Taking Active   nitroGLYCERIN (NITROSTAT) 0.4 MG SL tablet [id number] Yes Place 1 tablet (0.4 mg total) under the tongue every 5 (five) minutes as needed for chest pain. [name], MD Taking Active   pravastatin (PRAVACHOL) 20 MG tablet [id number] Yes Take 1 tablet (20 mg total) by mouth daily. [name], MD Taking Active   Semaglutide,0.25 or 0.5MG /DOS, (OZEMPIC, 0.25 OR 0.5 MG/DOSE,) 2 MG/3ML SOPN [id number] Yes Inject 0.25 mg into the skin once a week. [name], MD Taking Active            Med Note [facility], Kohan Azizi B   Tue Mar 04, 2024 10:04 AM) [employer] medication  assistance program thru 11/26/2024  sildenafil (REVATIO) 20 MG tablet [id number] Yes TAKE 1 TO 5 TABLETS BY MOUTH ONCE DAILY AS NEEDED FOR ERECTILE DYSFUNCTION [name], MD Taking Active   valsartan (DIOVAN) 80 MG tablet [id number] Yes Take 1 tablet (80 mg total) by mouth daily. [name], MD Taking Active               Assessment/Plan:   Diabetes / Obesity: Currently uncontrolled based on last A1c but home blood glucose has improved since starting Ozempic.  A1c goal < 7.0%; would like to see weight loss of 10 to 20% of current weight.  - Reviewed goal A1c, goal fasting, and goal 2 hour post prandial glucose  - Recommend continue glyburide to 1.5mg  with breakfast  - Due to GI issues - recommended patient adjust Ozempic a little for the next 2 week - he is to dial Ozempic pen up to 0.5mg  dose and then dial back for 10 clicks (this will equal a dose of about 0.36mg  per dose - he will gradually increase over the next 2 weeks as tolerated) .  -  regarding increase in heartburn. Reminded patient to be mindful when eating to stop when he feel full. Reminded him of referral to GI - there is a MyChart message from 01/16/2024  with phone number to call to schedule an appointment. Patient will have his wife check on this. - Recommend to check glucose 1 to 2 times per day  Hypertension: - Currently uncontrolled blood pressure goal < 130/80 - Reviewed long term cardiovascular and renal outcomes of uncontrolled blood pressure - Continue valsartan to 80mg  daily   Hyperlipidemia/ASCVD Risk Reduction: Currently uncontrolled. LDL goal < 70 and Tg < 150 - Reviewed long term complications of uncontrolled cholesterol - Recommend to contnue pravastatin 20mg  daily - consider more potent statin if LDL not at goal of < 70    Follow Up Plan: 4 to 6 weeks   Gary Short, PharmD Clinical Pharmacist [facility]

## 2024-03-30 NOTE — Assessment & Plan Note (Deleted)
hgba1c acceptable, minimize simple carbs. Increase exercise as tolerated. Continue current med 

## 2024-03-30 NOTE — Assessment & Plan Note (Deleted)
Encouraged DASH diet, decrease po intake and increase exercise as tolerated. Needs 7-8 hours of sleep nightly. Avoid trans fats, eat small, frequent meals every 4-5 hours with lean proteins, complex carbs and healthy fats. Minimize simple carbs, has worked hard and given up all alcohol, all sodas and has cut down his portions.

## 2024-03-30 NOTE — Assessment & Plan Note (Deleted)
 Doing well on Diovan  80 mg daily

## 2024-03-30 NOTE — Assessment & Plan Note (Deleted)
 Encourage heart healthy diet such as MIND or DASH diet, increase exercise, avoid trans fats, simple carbohydrates and processed foods, consider a krill or fish or flaxseed oil cap daily.

## 2024-04-01 ENCOUNTER — Telehealth: Payer: Medicare Other | Admitting: Family Medicine

## 2024-04-01 DIAGNOSIS — R030 Elevated blood-pressure reading, without diagnosis of hypertension: Secondary | ICD-10-CM

## 2024-04-01 DIAGNOSIS — E6609 Other obesity due to excess calories: Secondary | ICD-10-CM

## 2024-04-01 DIAGNOSIS — E1169 Type 2 diabetes mellitus with other specified complication: Secondary | ICD-10-CM

## 2024-04-01 DIAGNOSIS — E782 Mixed hyperlipidemia: Secondary | ICD-10-CM

## 2024-04-07 ENCOUNTER — Other Ambulatory Visit: Payer: Self-pay | Admitting: Family Medicine

## 2024-04-17 ENCOUNTER — Other Ambulatory Visit: Payer: Self-pay | Admitting: Pharmacist

## 2024-04-17 DIAGNOSIS — E782 Mixed hyperlipidemia: Secondary | ICD-10-CM

## 2024-04-17 DIAGNOSIS — E1169 Type 2 diabetes mellitus with other specified complication: Secondary | ICD-10-CM

## 2024-04-17 DIAGNOSIS — Z7985 Long-term (current) use of injectable non-insulin antidiabetic drugs: Secondary | ICD-10-CM

## 2024-04-17 NOTE — Progress Notes (Signed)
 04/17/2024 Name: Gary Short. MRN: [id number] DOB: [birth year]  Chief Complaint  Patient presents with   Diabetes   Medication Management    Gary Short. is a 53 y.o. year old male who presented for a telephone visit. Married with 3 children at home. Patient has been disabled since around 2003. His wife is also disabled. Neither him nor his wife have coverage for medications. His children have Medicaid coverage.    They were referred to the pharmacist by their PCP for assistance in managing diabetes and medication access.    Subjective:  Care Team: Primary Care Provider: Neda Balk, MD ; Next Scheduled Visit: 04/01/2024  Medication Access/Adherence  Current Pharmacy:  [facility] [address] [place] - [address] [place] [address] Phone: [phone] Fax: [phone]  [facility] [address], [place] - [address] [place] [address] Phone: [phone] Fax: [phone]   Patient reports affordability concerns with their medications: Yes  - patient only had Medicare A and B - no prescription coverage. He has been approved to received Ozempic  thru 11/26/2024 form [employer] medication assistance program.  Patient reports access/transportation concerns to their pharmacy: sometimes has car problems but can find a way to get to pharmacy or medical visits . He lives about 3 hours away from our clinic in [place]. Patient reports adherence concerns with their medications:  Yes      Diabetes / Obesity  Wt Readings from Last 3 Encounters:  12/10/23 121.6 kg (268 lb)  12/06/23 117.9 kg (260 lb)  05/01/23 117.9 kg (260 lb)   BMI = 36.35  Current medications: glyburide  1.5mg  once daily with morning meal and Ozmepic 0.25mg  weekly - Started Ozempic  around 01/17/2024, increased dose to 0.5mg  in March but today he reports he decreased back to 0.25mg  because at higher dose he had a lot of nausea and  diarrhea.   Medications tried in the past: metformin  - stopped due to diarrhea; Novolin N insulin  - stopped due to hypoglycemia  Current glucose readings: checks blood glucose 1 to 2 times per week Blood glucose prior to starting Ozempic  was 190's to 200.  Recent blood glucose has improved with reading mostly in the 130's  Patient denies hypoglycemic s/sx including no dizziness, shakiness, sweating. Patient denies hyperglycemic symptoms.  Current meal patterns: (discussed at previous visit)  No fried foods recently No pasta Likes collards and cabbage - but doesn't like many other vegetables Breakfast - eggs, rice or grits; sometimes bacon Lunch - meat and potatoes Dinner - pork chops, chicken, potatoes Drinks - water; trying to avoid sodas now.   Wt Readings from Last 3 Encounters:  12/10/23 121.6 kg (268 lb)  12/06/23 117.9 kg (260 lb)  05/01/23 117.9 kg (260 lb)    Current physical activity: has chronic back pain which limits activity.  Current medication access support: Receiving Ozempic  thru [employer] thru 11/26/2024  Hypertension:  Current medications: valsartan  80mg  daily (dose increased 12/2023)  Medications previously tried: none  Patient has a validated, automated, upper arm home BP cuff Current blood pressure readings readings: no readings reported today.   Patient denies hypotensive s/sx including no dizziness, lightheadedness.  Patient denies hypertensive symptoms including no headache, chest pain, shortness of breath.    BP Readings from Last 3 Encounters:  12/10/23 (!) 145/85  05/01/23 134/82  10/30/22 126/80     Hyperlipidemia/ASCVD Risk Reduction  Current lipid lowering  medications: pravastatin  20mg  daily (increased from 10mg  daily 01/02/2024)  Medications tried in the past: atorvastatin  - unsure why stopped / changed   Antiplatelet regimen: aspirin  81mg  daily  ASCVD History: TIA Risk Factors:  diabetes, uncontrolled blood pressure,  elevated LDL and Tg, smoker   Objective:  Lab Results  Component Value Date   HGBA1C 9.2 (H) 12/10/2023    Lab Results  Component Value Date   CREATININE 1.04 12/10/2023   BUN 14 12/10/2023   NA 132 (L) 12/10/2023   K 4.9 12/10/2023   CL 98 12/10/2023   CO2 28 12/10/2023    Lab Results  Component Value Date   CHOL 188 12/10/2023   HDL 50.80 12/10/2023   LDLCALC 107 (H) 12/10/2023   LDLDIRECT 117.0 10/30/2022   TRIG 154.0 (H) 12/10/2023   CHOLHDL 4 12/10/2023    Medications Reviewed Today     Reviewed by Cecilie Coffee, RPH-CPP (Pharmacist) on 04/17/24 at 1017  Med List Status: <None>   Medication Order Taking? Sig Documenting Provider Last Dose Status Informant  aspirin  EC 81 MG tablet [id number] Yes Take 1 tablet (81 mg total) by mouth daily. Neda Balk, MD Taking Active   glucose blood (BAYER CONTOUR TEST) test strip [id number] Yes Check blood glucose once daily and as needed. Dx: E11.9 Neda Balk, MD Taking Active   glyBURIDE  micronized (GLYNASE ) 1.5 MG tablet [id number] Yes Take 1 tablet with morning meal and 0.5 tablet with evening meal. Neda Balk, MD Taking Active   HYDROcodone -acetaminophen  ([facility]CO) 7.5-325 MG tablet [id number]  Take 1-2 tablets by mouth every 6 (six) hours as needed. Neda Balk, MD  Active   MICROLET LANCETS MISC [id number] Yes Check blood sugar DX E11.9 Neda Balk, MD Taking Active   nitroGLYCERIN  (NITROSTAT ) 0.4 MG SL tablet [id number]  Place 1 tablet (0.4 mg total) under the tongue every 5 (five) minutes as needed for chest pain. Neda Balk, MD  Active   pravastatin  (PRAVACHOL ) 20 MG tablet [id number] Yes Take 1 tablet (20 mg total) by mouth daily. Neda Balk, MD Taking Active   Semaglutide ,0.25 or 0.5MG /DOS, (OZEMPIC , 0.25 OR 0.5 MG/DOSE,) 2 MG/3ML SOPN [id number] Yes Inject 0.25 mg into the skin once a week. Neda Balk, MD Taking Active            Med Note [facility], Landyn Buckalew B   Tue Mar 04, 2024 10:04 AM) [employer]  medication assistance program thru 11/26/2024  sildenafil  (REVATIO ) 20 MG tablet [id number]  TAKE 1 TO 5 TABLETS BY MOUTH ONCE DAILY AS NEEDED FOR ERECTILE DYSFUNCTION Neda Balk, MD  Active   valsartan  (DIOVAN ) 80 MG tablet [id number] Yes Take 1 tablet (80 mg total) by mouth daily. Neda Balk, MD Taking Active               Assessment/Plan:   Diabetes / Obesity: Currently uncontrolled based on last A1c but home blood glucose has improved since starting Ozempic .  A1c goal < 7.0%; would like to see weight loss of 10 to 20% of current weight.  - Reviewed goal A1c, goal fasting, and goal 2 hour post prandial glucose  - Recommend continue glyburide  to 1.5mg  with breakfast  - Continue Ozempci 0.25mg  weekly   - Recommend to check glucose 1 to 2 times per week  Hypertension: - Currently uncontrolled blood pressure goal < 130/80 - Reviewed long term cardiovascular and renal outcomes of uncontrolled blood pressure - Continue valsartan  to 80mg  daily   Hyperlipidemia/ASCVD Risk  Reduction: Currently uncontrolled. LDL goal < 70 and Tg < 150 - Reviewed long term complications of uncontrolled cholesterol - Recommend to contnue pravastatin  20mg  daily - consider more potent statin if LDL not at goal of < 70    Follow Up Plan: 4 to 6 weeks; Has inperon follow up with Dr Rodrick Clapper in August.   Cecilie Coffee, PharmD Clinical Pharmacist [facility]

## 2024-05-19 ENCOUNTER — Other Ambulatory Visit: Payer: Self-pay | Admitting: Family Medicine

## 2024-05-20 ENCOUNTER — Telehealth: Payer: Self-pay

## 2024-05-20 NOTE — Telephone Encounter (Signed)
 Patient is aware that his Ozempic  is ready for pick up at the office and he going try to make it over this week to pick.

## 2024-06-12 ENCOUNTER — Other Ambulatory Visit: Payer: Self-pay | Admitting: Pharmacist

## 2024-06-12 MED ORDER — GLYBURIDE MICRONIZED 1.5 MG PO TABS: ORAL_TABLET | ORAL | 0 refills | Status: DC

## 2024-06-12 NOTE — Addendum Note (Signed)
 Addended by: CARLA MILLING B on: 06/12/2024 10:46 AM   Modules accepted: Orders

## 2024-06-12 NOTE — Progress Notes (Signed)
 06/12/2024 Name: Gary Short. MRN: [id number] DOB: [birth year]  Chief Complaint  Patient presents with   Diabetes   Medication Management    Edman Lipsey. is a 53 y.o. year old male who presented for a telephone visit. Married with 3 children at home. Patient has been disabled since around 2003. His wife is also disabled. Neither him nor his wife have coverage for medications. His children have Medicaid coverage.    They were referred to the pharmacist by their PCP for assistance in managing diabetes and medication access.    Subjective:  Care Team: Primary Care Provider: Domenica Harlene LABOR, MD ; Next Scheduled Visit: 07/03/2024  Medication Access/Adherence  Current Pharmacy:  [facility] [address], [place] - [address] [place] [address] Phone: [phone] Fax: [phone]  [facility] [address] [place] - [address] [place] [address] Phone: [phone] Fax: [phone]   Patient reports affordability concerns with their medications: Yes  - patient only had Medicare A and B - no prescription coverage. He has been approved to received Ozempic  thru 11/26/2024 form [employer] medication assistance program. Last delivery to our office was 05/20/2024 - pens of Ozempic  0.25/0.5mg  dose Patient reports access/transportation concerns to their pharmacy: sometimes has car problems but can find a way to get to pharmacy or medical visits . He lives about 3 hours away from our clinic in [place]. Patient reports adherence concerns with their medications:  No      Diabetes / Obesity  Wt Readings from Last 3 Encounters:  12/10/23 268 lb (121.6 kg)  12/06/23 260 lb (117.9 kg)  05/01/23 260 lb (117.9 kg)   BMI = 36.35  Current medications:  glyburide  1.5mg  once daily with morning meal and 0.75mg  each evening.  Ozmepic 0.25mg  weekly - Started Ozempic  around 01/17/2024. He tried to increase dose to  0.5mg  in March but he reports he decreased back to 0.25mg  because at higher dose he had a lot of nausea and diarrhea.   Medications tried in the past: metformin  - stopped due to diarrhea; Novolin N insulin  - stopped due to hypoglycemia  Current glucose readings: checks blood glucose 1 to 2 times per week Blood glucose prior to starting Ozempic  was 190's to 200.  Recent blood glucose readings 110 to 130's  Patient denies hypoglycemic s/sx including no dizziness, shakiness, sweating. Patient denies hyperglycemic symptoms.  Current meal patterns: (discussed at previous visit)  He continues to limit pasta. Also tried to avoid fried foods.  Likes collards and cabbage - but doesn't like many other vegetables Breakfast - eggs, rice or grits; sometimes bacon Lunch - meat and potatoes Dinner - pork chops, chicken, potatoes Drinks - water; trying to avoid sodas now.   Current physical activity: has chronic back pain which limits activity.  Current medication access support: Receiving Ozempic  thru [employer] thru 11/26/2024  Hypertension:  Current medications: valsartan  80mg  daily (dose increased 12/2023)  Medications previously tried: none  Patient has a validated, automated, upper arm home BP cuff Current blood pressure readings readings: no readings reported today.   Patient denies hypotensive s/sx including no dizziness, lightheadedness.  Patient denies hypertensive symptoms including no headache, chest pain, shortness of breath.    BP Readings from Last 3 Encounters:  12/10/23 (!) 145/85  05/01/23 134/82  10/30/22 126/80     Hyperlipidemia/ASCVD Risk Reduction  Current lipid lowering medications: pravastatin  20mg  daily (increased from  10mg  daily 01/02/2024)  Medications tried in the past: atorvastatin  - unsure why stopped / changed   Antiplatelet regimen: aspirin  81mg  daily  ASCVD History: TIA Risk Factors:  diabetes, uncontrolled blood pressure, elevated LDL and Tg,  smoker   Objective:  Lab Results  Component Value Date   HGBA1C 9.2 (H) 12/10/2023    Lab Results  Component Value Date   CREATININE 1.04 12/10/2023   BUN 14 12/10/2023   NA 132 (L) 12/10/2023   K 4.9 12/10/2023   CL 98 12/10/2023   CO2 28 12/10/2023    Lab Results  Component Value Date   CHOL 188 12/10/2023   HDL 50.80 12/10/2023   LDLCALC 107 (H) 12/10/2023   LDLDIRECT 117.0 10/30/2022   TRIG 154.0 (H) 12/10/2023   CHOLHDL 4 12/10/2023    Medications Reviewed Today     Reviewed by Carla Milling, RPH-CPP (Pharmacist) on 06/12/24 at 1036  Med List Status: <None>   Medication Order Taking? Sig Documenting Provider Last Dose Status Informant  aspirin  EC 81 MG tablet [id number] Yes Take 1 tablet (81 mg total) by mouth daily. Domenica Harlene LABOR, MD  Active   glucose blood (BAYER CONTOUR TEST) test strip [id number] Yes Check blood glucose once daily and as needed. Dx: E11.9 Domenica Harlene LABOR, MD  Active   glyBURIDE  micronized (GLYNASE ) 1.5 MG tablet [id number] Yes Take 1 tablet with morning meal and 0.5 tablet with evening meal. Domenica Harlene LABOR, MD  Active   HYDROcodone -acetaminophen  (NORCO) 7.5-325 MG tablet [id number]  Take 1-2 tablets by mouth every 6 (six) hours as needed. Domenica Harlene LABOR, MD  Active   MICROLET LANCETS MISC [id number] Yes Check blood sugar DX E11.9 Domenica Harlene LABOR, MD  Active   nitroGLYCERIN  (NITROSTAT ) 0.4 MG SL tablet [id number]  Place 1 tablet (0.4 mg total) under the tongue every 5 (five) minutes as needed for chest pain. Domenica Harlene LABOR, MD  Active   pravastatin  (PRAVACHOL ) 20 MG tablet [id number] Yes Take 1 tablet (20 mg total) by mouth daily. Domenica Harlene LABOR, MD  Active   Semaglutide ,0.25 or 0.5MG /DOS, (OZEMPIC , 0.25 OR 0.5 MG/DOSE,) 2 MG/3ML SOPN [id number] Yes Inject 0.25 mg into the skin once a week. Domenica Harlene LABOR, MD  Active            Med Note [facility], Deonta Bomberger B   Tue Mar 04, 2024 10:04 AM) Nov[employer] medication assistance program thru 11/26/2024  sildenafil   (REVATIO ) 20 MG tablet [id number]  TAKE 1 TO 5 TABLETS BY MOUTH ONCE DAILY AS NEEDED FOR ERECTILE DYSFUNCTION Domenica Harlene LABOR, MD  Active   valsartan  (DIOVAN ) 80 MG tablet [id number] Yes Take 1 tablet (80 mg total) by mouth daily. Domenica Harlene LABOR, MD  Active               Assessment/Plan:   Diabetes / Obesity: Currently uncontrolled based on last A1c but home blood glucose has improved since starting Ozempic .  A1c goal < 7.0%; would like to see weight loss of 10 to 20% of current weight.  - Reviewed goal A1c, goal fasting, and goal 2 hour post prandial glucose  - Recommend continue glyburide  to 1.5mg  with breakfast  - Continue Ozempci 0.25mg  weekly. Patient advised that [employer] will not auto fill Ozempic  going forward. He is to notify our office when he has 1.5 pens left so we can request refill from Novo patient assistance program.  - Recommend to check glucose 1 to 2 times per week  Hypertension: - Currently uncontrolled  blood pressure goal < 130/80 - Reviewed long term cardiovascular and renal outcomes of uncontrolled blood pressure - Continue valsartan  to 80mg  daily   Hyperlipidemia/ASCVD Risk Reduction: Currently uncontrolled. LDL goal < 70 and Tg < 150 - Reviewed long term complications of uncontrolled cholesterol - Recommend to contnue pravastatin  20mg  daily - consider more potent statin if LDL not at goal of < 70    Follow Up Plan: In person follow up with Dr Domenica in August. I will follow up in October 2025.   Madelin Ray, PharmD Clinical Pharmacist [facility]

## 2024-06-25 ENCOUNTER — Other Ambulatory Visit: Payer: Self-pay | Admitting: Family Medicine

## 2024-07-03 ENCOUNTER — Ambulatory Visit: Payer: Medicare Other | Admitting: Family Medicine

## 2024-07-03 ENCOUNTER — Ambulatory Visit: Admitting: Student

## 2024-07-29 ENCOUNTER — Other Ambulatory Visit: Payer: Self-pay | Admitting: Family Medicine

## 2024-09-12 ENCOUNTER — Other Ambulatory Visit: Payer: Self-pay | Admitting: Pharmacist

## 2024-09-19 ENCOUNTER — Other Ambulatory Visit: Payer: Self-pay | Admitting: Family

## 2024-09-22 ENCOUNTER — Encounter: Payer: Self-pay | Admitting: Family Medicine

## 2024-09-22 MED ORDER — SILDENAFIL CITRATE 20 MG PO TABS: ORAL_TABLET | ORAL | 0 refills | Status: AC

## 2024-10-05 ENCOUNTER — Encounter: Payer: Self-pay | Admitting: Pharmacist

## 2024-10-06 NOTE — Progress Notes (Signed)
 10/06/2024 Name: Gary Short. MRN: [id number] DOB: Aug 31, 1971  Chief Complaint  Patient presents with   Diabetes   Medication Management    Gary Short. is a 53 y.o. year old male who presented for a telephone visit. Married with 3 children at home. Patient has been disabled since around 2003. His wife is also disabled. Neither him nor his wife have coverage for medications. His children have Medicaid coverage.    They were referred to the pharmacist by their PCP for assistance in managing diabetes and medication access.    Subjective:  Care Team: Primary Care Provider: Domenica Harlene LABOR, MD ; Next Scheduled Visit: 11/10/2024  Medication Access/Adherence  Current Pharmacy:  [facility] [address] [place] - [address] [place] [address] Phone: [phone] Fax: [phone]  [facility] [address] [place] - [address] [place] [address] Phone: [phone] Fax: [phone]   Patient reports affordability concerns with their medications: Yes  - patient only had Medicare A and B - no prescription coverage. He has been approved to received Ozempic  thru 11/26/2024 form [employer] medication assistance program. Last delivery to our office was 05/20/2024 - pens of Ozempic  0.25/0.5mg  dose Patient reports access/transportation concerns to their pharmacy: sometimes has car problems but can find a way to get to pharmacy or medical visits . He lives about 3 hours away from our clinic in [place]. Patient reports adherence concerns with their medications:  No      Diabetes / Obesity  Wt Readings from Last 3 Encounters:  12/10/23 268 lb (121.6 kg)  12/06/23 260 lb (117.9 kg)  05/01/23 260 lb (117.9 kg)   BMI = 36.35  Current medications:  glyburide  1.5mg  once daily with morning meal and 0.75mg  each evening.  Ozmepic 0.25mg  weekly - Started Ozempic  around 01/17/2024. He tried to increase dose to  0.5mg  in March but he reports he decreased back to 0.25mg  because at higher dose he had a lot of nausea and diarrhea. Patient reports he has 3 doses of Ozempic  left.   Medications tried in the past: metformin  - stopped due to diarrhea; Novolin N insulin  - stopped due to hypoglycemia  Current medication access support: Receiving Ozempic  thru [employer] thru 11/26/2024  Hypertension:  Current medications: valsartan  80mg  daily (dose increased 12/2023)  Medications previously tried: none  Patient has a validated, automated, upper arm home BP cuff Current blood pressure readings readings: no readings reported today.   Patient denies hypotensive s/sx including no dizziness, lightheadedness.  Patient denies hypertensive symptoms including no headache, chest pain, shortness of breath.    BP Readings from Last 3 Encounters:  12/10/23 (!) 145/85  05/01/23 134/82  10/30/22 126/80     Hyperlipidemia/ASCVD Risk Reduction  Current lipid lowering medications: pravastatin  20mg  daily (increased from 10mg  daily 01/02/2024)  Medications tried in the past: atorvastatin  - unsure why stopped / changed   Antiplatelet regimen: aspirin  81mg  daily  ASCVD History: TIA Risk Factors:  diabetes, uncontrolled blood pressure, elevated LDL and Tg, smoker   Objective:  Lab Results  Component Value Date   HGBA1C 9.2 (H) 12/10/2023    Lab Results  Component Value Date   CREATININE 1.04 12/10/2023   BUN 14 12/10/2023   NA 132 (L) 12/10/2023   K 4.9 12/10/2023   CL 98 12/10/2023   CO2 28 12/10/2023    Lab Results  Component Value Date   CHOL  188 12/10/2023   HDL 50.80 12/10/2023   LDLCALC 107 (H) 12/10/2023   LDLDIRECT 117.0 10/30/2022   TRIG 154.0 (H) 12/10/2023   CHOLHDL 4 12/10/2023    Medications Reviewed Today     Reviewed by Carla Milling, RPH-CPP (Pharmacist) on 10/06/24 at 1512  Med List Status: <None>   Medication Order Taking? Sig Documenting Provider Last Dose Status  Informant  aspirin  EC 81 MG tablet [id number]  Take 1 tablet (81 mg total) by mouth daily. Domenica Harlene LABOR, MD  Active   glucose blood (BAYER CONTOUR TEST) test strip [id number]  Check blood glucose once daily and as needed. Dx: E11.9 Domenica Harlene LABOR, MD  Active   glyBURIDE  micronized (GLYNASE ) 1.5 MG tablet [id number]  Take 1 tablet with morning meal and 0.5 tablet with evening meal. Domenica Harlene LABOR, MD  Active   HYDROcodone -acetaminophen  (NORCO) 7.5-325 MG tablet [id number] No Take 1-2 tablets by mouth every 6 (six) hours as needed. Domenica Harlene LABOR, MD Taking Active   MICROLET LANCETS MISC [id number]  Check blood sugar DX E11.9 Domenica Harlene LABOR, MD  Active   nitroGLYCERIN  (NITROSTAT ) 0.4 MG SL tablet [id number] No Place 1 tablet (0.4 mg total) under the tongue every 5 (five) minutes as needed for chest pain. Domenica Harlene LABOR, MD Taking Active   pravastatin  (PRAVACHOL ) 20 MG tablet [id number]  Take 1 tablet (20 mg total) by mouth daily. Domenica Harlene LABOR, MD  Active   Semaglutide ,0.25 or 0.5MG /DOS, (OZEMPIC , 0.25 OR 0.5 MG/DOSE,) 2 MG/3ML SOPN [id number]  Inject 0.25 mg into the skin once a week. Domenica Harlene LABOR, MD  Active            Med Note [facility], Jamika Sadek B   Tue Mar 04, 2024 10:04 AM) Novo medication assistance program thru 11/26/2024  sildenafil  (REVATIO ) 20 MG tablet [id number]  TAKE 1 TO 5 TABLETS BY MOUTH ONCE DAILY AS NEEDED FOR ERECTILE DYSFUNCTION Domenica Harlene LABOR, MD  Active   valsartan  (DIOVAN ) 80 MG tablet [id number]  Take 1 tablet (80 mg total) by mouth daily. Domenica Harlene LABOR, MD  Active               Assessment/Plan:   Diabetes / Obesity: Currently uncontrolled based on last A1c but A1c has not been rechecked since starting Ozempic .  A1c goal < 7.0%; would like to see weight loss of 10 to 20% of current weight.  - Reviewed goal A1c, goal fasting, and goal 2 hour post prandial glucose  - Recommend continue glyburide  to 1.5mg  with breakfast  - Continue Ozempci 0.25mg  weekly. Completing  refill request for Novo Nordisk patient assistance program.  Will hold off on starting renewal for 2026 until patient follows up with PCP in December 2025.  - Recommend to check glucose 1 to 2 times per week  Hypertension: - Currently uncontrolled blood pressure goal < 130/80 - Reviewed long term cardiovascular and renal outcomes of uncontrolled blood pressure - Continue valsartan  to 80mg  daily   Hyperlipidemia/ASCVD Risk Reduction: Currently uncontrolled. LDL goal < 70 and Tg < 150 - Reviewed long term complications of uncontrolled cholesterol - Recommend to contnue pravastatin  20mg  daily - consider more potent statin if LDL not at goal of < 70    Follow Up Plan: Dr Domenica 11/10/2024; I will follow up in late December to start 2026 application.   Milling Carla, PharmD Clinical Pharmacist [facility]

## 2024-10-15 ENCOUNTER — Telehealth: Payer: Self-pay | Admitting: Pharmacist

## 2024-10-15 MED ORDER — VALSARTAN 80 MG PO TABS
80.0000 mg | ORAL_TABLET | Freq: Every day | ORAL | 0 refills | Status: AC
Start: 2024-10-15 — End: ?

## 2024-10-15 MED ORDER — PRAVASTATIN SODIUM 20 MG PO TABS
20.0000 mg | ORAL_TABLET | Freq: Every day | ORAL | 0 refills | Status: AC
Start: 2024-10-15 — End: ?

## 2024-10-15 NOTE — Progress Notes (Signed)
 10/15/2024 Name: Gary Short. MRN: [id number] DOB: 01-07-1971  Chief Complaint  Patient presents with   Medication Adherence    Gary Short. is a 53 y.o. year old male who presented for a telephone visit. Married with 3 children at home. Patient has been disabled since around 2003. His wife is also disabled. He has medicare medical benefits but no coverage for medications. His children have Medicaid coverage.    They were referred to the pharmacist by their PCP for assistance in managing diabetes and medication access.    Subjective:  Care Team: Primary Care Provider: Domenica Harlene LABOR, MD ; Next Scheduled Visit: 11/10/2024  Medication Access/Adherence  Current Pharmacy:  [facility] [address] [place] - [address] [place] [address] Phone: [phone] Fax: [phone]  [facility] [address] [place] - [address] [place] [address] Phone: [phone] Fax: [phone]   Patient reports affordability concerns with their medications: Yes  - patient only had Medicare A and B - no prescription coverage. He has been approved to received Ozempic  thru 11/26/2024 from [employer] medication assistance program. Last delivery to our office was 05/20/2024 - pens of Ozempic  0.25/0.5mg  dose Patient reports access/transportation concerns to their pharmacy: sometimes has car problems but can find a way to get to pharmacy or medical visits . He lives about 3 hours away from our clinic in [place]. Patient reports adherence concerns with their medications:  No      Diabetes / Obesity  Wt Readings from Last 3 Encounters:  12/10/23 268 lb (121.6 kg)  12/06/23 260 lb (117.9 kg)  05/01/23 260 lb (117.9 kg)   BMI = 36.35  Current medications:  Ozmepic 0.25mg  weekly - Started Ozempic  around 01/17/2024. He tried to increase dose to 0.5mg  in March but he reports he decreased back to 0.25mg  because at higher  dose he had a lot of nausea and diarrhea. Patient reports he has 3 doses of Ozempic  left.   Has glyburide  1.5mg  once daily with morning meal and 0.75mg  each evening on his med list but patient states he has not been taking glyburide  in several months.   Recent home blood glucose has been 120-140 (FBG)   Medications tried in the past: metformin  - stopped due to diarrhea; Novolin N insulin  - stopped due to hypoglycemia  Current medication access support: Receiving Ozempic  thru [employer] thru 11/26/2024  Hypertension:  Current medications: valsartan  80mg  daily (dose increased 12/2023 but patient reports he has not taken in several months)   Medications previously tried: none  Patient has a validated, automated, upper arm home BP cuff Current blood pressure readings readings: no readings reported today.   Patient denies hypotensive s/sx including no dizziness, lightheadedness.  Patient denies hypertensive symptoms including no headache, chest pain, shortness of breath.    BP Readings from Last 3 Encounters:  12/10/23 (!) 145/85  05/01/23 134/82  10/30/22 126/80     Hyperlipidemia/ASCVD Risk Reduction  Current lipid lowering medications: pravastatin  20mg  daily (increased from 10mg  daily 01/02/2024 but he has not taken pravastatin  in several months)   Medications tried in the past: atorvastatin  - unsure why stopped / changed   Antiplatelet regimen: aspirin  81mg  daily  ASCVD History: TIA Risk Factors:  diabetes, uncontrolled blood pressure, elevated LDL and Tg, smoker   Objective:  Lab Results  Component Value Date   HGBA1C 9.2 (H) 12/10/2023    Lab Results  Component Value Date   CREATININE 1.04 12/10/2023   BUN 14 12/10/2023   NA 132 (L) 12/10/2023   K 4.9 12/10/2023   CL 98 12/10/2023   CO2 28 12/10/2023    Lab Results  Component Value Date   CHOL 188 12/10/2023   HDL 50.80 12/10/2023   LDLCALC 107 (H) 12/10/2023   LDLDIRECT 117.0 10/30/2022   TRIG  154.0 (H) 12/10/2023   CHOLHDL 4 12/10/2023    Medications Reviewed Today     Reviewed by Carla Milling, RPH-CPP (Pharmacist) on 10/15/24 at 1505  Med List Status: <None>   Medication Order Taking? Sig Documenting Provider Last Dose Status Informant  aspirin  EC 81 MG tablet [id number]  Take 1 tablet (81 mg total) by mouth daily. Domenica Harlene LABOR, MD  Active   glucose blood (BAYER CONTOUR TEST) test strip [id number]  Check blood glucose once daily and as needed. Dx: E11.9 Domenica Harlene LABOR, MD  Active   glyBURIDE  micronized (GLYNASE ) 1.5 MG tablet [id number]  Take 1 tablet with morning meal and 0.5 tablet with evening meal. Domenica Harlene LABOR, MD  Active   HYDROcodone -acetaminophen  (NORCO) 7.5-325 MG tablet [id number] No Take 1-2 tablets by mouth every 6 (six) hours as needed. Domenica Harlene LABOR, MD Taking Active   MICROLET LANCETS MISC [id number]  Check blood sugar DX E11.9 Domenica Harlene LABOR, MD  Active   nitroGLYCERIN  (NITROSTAT ) 0.4 MG SL tablet [id number] No Place 1 tablet (0.4 mg total) under the tongue every 5 (five) minutes as needed for chest pain. Domenica Harlene LABOR, MD Taking Active   pravastatin  (PRAVACHOL ) 20 MG tablet [id number]  Take 1 tablet (20 mg total) by mouth daily. Domenica Harlene LABOR, MD  Active   Semaglutide ,0.25 or 0.5MG /DOS, (OZEMPIC , 0.25 OR 0.5 MG/DOSE,) 2 MG/3ML SOPN [id number]  Inject 0.25 mg into the skin once a week. Domenica Harlene LABOR, MD  Active            Med Note [facility], Noela Brothers B   Tue Mar 04, 2024 10:04 AM) Novo medication assistance program thru 11/26/2024  sildenafil  (REVATIO ) 20 MG tablet [id number]  TAKE 1 TO 5 TABLETS BY MOUTH ONCE DAILY AS NEEDED FOR ERECTILE DYSFUNCTION Domenica Harlene LABOR, MD  Active   valsartan  (DIOVAN ) 80 MG tablet [id number]  Take 1 tablet (80 mg total) by mouth daily. Domenica Harlene LABOR, MD  Active               Assessment/Plan:   Diabetes / Obesity: Currently uncontrolled based on last A1c but A1c has not been rechecked since starting Ozempic .  A1c goal <  7.0%; would like to see weight loss of 10 to 20% of current weight.  - Reviewed goal A1c, goal fasting, and goal 2 hour post prandial glucose  - Recommend continue glyburide  to 1.5mg  with breakfast  - Continue Ozempci 0.25mg  weekly. Completed refill request for Novo Nordisk patient assistance program. Awaiting delivery.  Will start renewal for 2026 but don't send until patient come in for appointment with PCP 11/10/2024 - Recommend to check glucose 1 to 2 times per week  Hypertension: - Currently uncontrolled blood pressure goal < 130/80 - Reviewed long term cardiovascular and renal outcomes of uncontrolled blood pressure - restart valsartan  to 80mg  daily   Hyperlipidemia/ASCVD Risk Reduction: Currently uncontrolled. LDL goal < 70 and Tg < 150 - Reviewed long term complications of uncontrolled cholesterol - Recommend to restart pravastatin  20mg  daily - consider more potent statin if LDL not at goal of < 70  Meds ordered this encounter  Medications   valsartan  (DIOVAN ) 80 MG tablet    Sig: Take 1 tablet (80 mg total) by mouth daily.    Dispense:  30 tablet    Refill:  0    Please cancel any previous prescriptions - dose increase. Profile until patient requests.   pravastatin  (PRAVACHOL ) 20 MG tablet    Sig: Take 1 tablet (20 mg total) by mouth daily.    Dispense:  30 tablet    Refill:  0     Follow Up Plan: Dr Domenica 11/10/2024; I will follow up in late December to start 2026 application.   Madelin Ray, PharmD Clinical Pharmacist [facility]

## 2024-10-18 ENCOUNTER — Other Ambulatory Visit: Payer: Self-pay | Admitting: Family Medicine

## 2024-11-09 NOTE — Assessment & Plan Note (Signed)
 Encouraged complete cessation. Discussed need to quit as relates to risk of numerous cancers, cardiac and pulmonary disease as well as neurologic complications. Counseled for greater than 3 minutes. 1/2 ppd

## 2024-11-09 NOTE — Progress Notes (Unsigned)
 Subjective:    Patient ID: Gary JINNY Glendia Mickey., male    DOB: 04-10-1971, 53 y.o.   MRN: [id number]  No chief complaint on file.   HPI Discussed the use of AI scribe software for clinical note transcription with the patient, who gave verbal consent to proceed.  History of Present Illness     Past Medical History:  Diagnosis Date   Atypical chest pain 05/08/2017   Back pain 06/13/2010   Qualifier: Diagnosis of  By: Domenica MD, Harlene Duane back at work in 2002, was able to work until 2004. Ultimately had  Surgeries to his lower back twice and after complications with a spinal leak, was never able to return to work Radicular symptoms occur b/l.    Knee pain, bilateral 08/26/2014   Medicare annual wellness visit, subsequent 05/07/2016   Obesity, unspecified 06/13/2010   Qualifier: Diagnosis of  By: Domenica MD, Harlene      Past Surgical History:  Procedure Laterality Date   BACK SURGERY     X 2   KNEE SURGERY  1999   cartilage repair    Family History  Problem Relation Age of Onset   Cancer Mother        breast   Diabetes Mother    Hyperlipidemia Mother    Hypertension Mother    Stroke Father    Diabetes Father    Hyperlipidemia Father    Hypertension Father    Diabetes Sister    Arthritis Sister    Diabetes Sister    Cancer Maternal Uncle        throat, stomach   Cancer Maternal Grandmother    Arthritis Other        family hx of   Diabetes Other        family hx of   Hypertension Other        family hx of   Stroke Other        family hx of   Sudden death Other        family hx of    Social History   Socioeconomic History   Marital status: Married    Spouse name: Not on file   Number of children: Not on file   Years of education: Not on file   Highest education level: Associate degree: occupational, scientist, product/process development, or vocational program  Occupational History   Not on file  Tobacco Use   Smoking status: Every Day    Current packs/day: 0.50    Types: Cigarettes    Smokeless tobacco: Never  Substance and Sexual Activity   Alcohol use: No   Drug use: No   Sexual activity: Not on file  Other Topics Concern   Not on file  Social History Narrative   Not on file   Social Drivers of Health   Tobacco Use: High Risk (01/01/2024)   Patient History    Smoking Tobacco Use: Every Day    Smokeless Tobacco Use: Never    Passive Exposure: Not on file  Financial Resource Strain: Low Risk (12/06/2023)   Overall Financial Resource Strain (CARDIA)    Difficulty of Paying Living Expenses: Not hard at all  Food Insecurity: No Food Insecurity (12/06/2023)   Hunger Vital Sign    Worried About Running Out of Food in the Last Year: Never true    Ran Out of Food in the Last Year: Never true  Transportation Needs: No Transportation Needs (12/06/2023)   PRAPARE - Transportation    Lack  of Transportation (Medical): No    Lack of Transportation (Non-Medical): No  Physical Activity: Inactive (12/06/2023)   Exercise Vital Sign    Days of Exercise per Week: 0 days    Minutes of Exercise per Session: 0 min  Stress: No Stress Concern Present (12/06/2023)   [employer] of Occupational Health - Occupational Stress Questionnaire    Feeling of Stress : Not at all  Social Connections: Moderately Isolated (12/06/2023)   Social Connection and Isolation Panel    Frequency of Communication with Friends and Family: More than three times a week    Frequency of Social Gatherings with Friends and Family: More than three times a week    Attends Religious Services: Never    Database Administrator or Organizations: No    Attends Banker Meetings: Never    Marital Status: Married  Catering Manager Violence: Not At Risk (12/06/2023)   Humiliation, Afraid, Rape, and Kick questionnaire    Fear of Current or Ex-Partner: No    Emotionally Abused: No    Physically Abused: No    Sexually Abused: No  Depression (PHQ2-9): Low Risk (12/06/2023)   Depression (PHQ2-9)    PHQ-2 Score: 0   Alcohol Screen: Low Risk (12/06/2023)   Alcohol Screen    Last Alcohol Screening Score (AUDIT): 2  Housing: Unknown (12/06/2023)   Housing Stability Vital Sign    Unable to Pay for Housing in the Last Year: No    Number of Times Moved in the Last Year: Not on file    Homeless in the Last Year: No  Utilities: Not At Risk (12/06/2023)   AHC Utilities    Threatened with loss of utilities: No  Health Literacy: Adequate Health Literacy (12/06/2023)   B1300 Health Literacy    Frequency of need for help with medical instructions: Never    Outpatient Medications Prior to Visit  Medication Sig Dispense Refill   aspirin  EC 81 MG tablet Take 1 tablet (81 mg total) by mouth daily.     glucose blood (BAYER CONTOUR TEST) test strip Check blood glucose once daily and as needed. Dx: E11.9 100 each 5   glyBURIDE  micronized (GLYNASE ) 1.5 MG tablet Take 1 tablet with morning meal and 0.5 tablet with evening meal. 45 tablet 0   HYDROcodone -acetaminophen  (NORCO) 7.5-325 MG tablet Take 1-2 tablets by mouth every 6 (six) hours as needed. 30 tablet 0   MICROLET LANCETS MISC Check blood sugar DX E11.9 100 each 5   nitroGLYCERIN  (NITROSTAT ) 0.4 MG SL tablet Place 1 tablet (0.4 mg total) under the tongue every 5 (five) minutes as needed for chest pain. 25 tablet 1   pravastatin  (PRAVACHOL ) 20 MG tablet Take 1 tablet (20 mg total) by mouth daily. 30 tablet 0   Semaglutide ,0.25 or 0.5MG /DOS, (OZEMPIC , 0.25 OR 0.5 MG/DOSE,) 2 MG/3ML SOPN Inject 0.25 mg into the skin once a week.     sildenafil  (REVATIO ) 20 MG tablet TAKE 1 TO 5 TABLETS BY MOUTH ONCE DAILY AS NEEDED FOR ERECTILE DYSFUNCTION 90 tablet 0   valsartan  (DIOVAN ) 80 MG tablet Take 1 tablet (80 mg total) by mouth daily. 30 tablet 0   No facility-administered medications prior to visit.    Allergies[1]  ROS     Objective:    Physical Exam  There were no vitals taken for this visit. Wt Readings from Last 3 Encounters:  12/10/23 268 lb (121.6 kg)   12/06/23 260 lb (117.9 kg)  05/01/23 260 lb (117.9 kg)  Diabetic Foot Exam - Simple   No data filed    Lab Results  Component Value Date   WBC 6.5 12/10/2023   HGB 16.1 12/10/2023   HCT 48.5 12/10/2023   PLT 199.0 12/10/2023   GLUCOSE 196 (H) 12/10/2023   CHOL 188 12/10/2023   TRIG 154.0 (H) 12/10/2023   HDL 50.80 12/10/2023   LDLDIRECT 117.0 10/30/2022   LDLCALC 107 (H) 12/10/2023   ALT 22 12/10/2023   AST 14 12/10/2023   NA 132 (L) 12/10/2023   K 4.9 12/10/2023   CL 98 12/10/2023   CREATININE 1.04 12/10/2023   BUN 14 12/10/2023   CO2 28 12/10/2023   TSH 1.67 05/01/2023   PSA 0.19 10/30/2022   HGBA1C 9.2 (H) 12/10/2023    Lab Results  Component Value Date   TSH 1.67 05/01/2023   Lab Results  Component Value Date   WBC 6.5 12/10/2023   HGB 16.1 12/10/2023   HCT 48.5 12/10/2023   MCV 94.3 12/10/2023   PLT 199.0 12/10/2023   Lab Results  Component Value Date   NA 132 (L) 12/10/2023   K 4.9 12/10/2023   CO2 28 12/10/2023   GLUCOSE 196 (H) 12/10/2023   BUN 14 12/10/2023   CREATININE 1.04 12/10/2023   BILITOT 0.7 12/10/2023   ALKPHOS 63 12/10/2023   AST 14 12/10/2023   ALT 22 12/10/2023   PROT 6.7 12/10/2023   ALBUMIN 4.4 12/10/2023   CALCIUM  9.3 12/10/2023   GFR 82.72 12/10/2023   Lab Results  Component Value Date   CHOL 188 12/10/2023   Lab Results  Component Value Date   HDL 50.80 12/10/2023   Lab Results  Component Value Date   LDLCALC 107 (H) 12/10/2023   Lab Results  Component Value Date   TRIG 154.0 (H) 12/10/2023   Lab Results  Component Value Date   CHOLHDL 4 12/10/2023   Lab Results  Component Value Date   HGBA1C 9.2 (H) 12/10/2023       Assessment & Plan:  Type 2 diabetes mellitus in patient with obesity (HCC) Assessment & Plan: hgba1c acceptable, minimize simple carbs. Increase exercise as tolerated. Continue current med    Tobacco abuse Assessment & Plan: Encouraged complete cessation. Discussed need to quit  as relates to risk of numerous cancers, cardiac and pulmonary disease as well as neurologic complications. Counseled for greater than 3 minutes. 1/2 ppd   Obesity due to excess calories with serious comorbidity, unspecified class Assessment & Plan: Encouraged DASH diet, decrease po intake and increase exercise as tolerated. Needs 7-8 hours of sleep nightly. Avoid trans fats, eat small, frequent meals every 4-5 hours with lean proteins, complex carbs and healthy fats. Minimize simple carbs, has worked hard and given up all alcohol, all sodas and has cut down his portions.    Mixed hyperlipidemia Assessment & Plan: Encourage heart healthy diet such as MIND or DASH diet, increase exercise, avoid trans fats, simple carbohydrates and processed foods, consider a krill or fish or flaxseed oil cap daily.     Migraine without aura and without status migrainosus, not intractable Assessment & Plan: Encouraged increased hydration, 64 ounces of clear fluids daily. Minimize alcohol and caffeine. Eat small frequent meals with lean proteins and complex carbs. Avoid high and low blood sugars. Get adequate sleep, 7-8 hours a night. Needs exercise daily preferably in the morning.     Assessment and Plan Assessment & Plan      Harlene Horton, MD    [1]  Allergies Allergen Reactions   Iodine Shortness Of Breath    Per pt I felt like I was going to die   Wasp Venom Swelling    syncope   Iodides

## 2024-11-09 NOTE — Assessment & Plan Note (Signed)
hgba1c acceptable, minimize simple carbs. Increase exercise as tolerated. Continue current med 

## 2024-11-09 NOTE — Assessment & Plan Note (Signed)
 Encourage heart healthy diet such as MIND or DASH diet, increase exercise, avoid trans fats, simple carbohydrates and processed foods, consider a krill or fish or flaxseed oil cap daily.

## 2024-11-09 NOTE — Assessment & Plan Note (Signed)
 Encouraged increased hydration, 64 ounces of clear fluids daily. Minimize alcohol and caffeine. Eat small frequent meals with lean proteins and complex carbs. Avoid high and low blood sugars. Get adequate sleep, 7-8 hours a night. Needs exercise daily preferably in the morning.

## 2024-11-09 NOTE — Assessment & Plan Note (Signed)
Encouraged DASH diet, decrease po intake and increase exercise as tolerated. Needs 7-8 hours of sleep nightly. Avoid trans fats, eat small, frequent meals every 4-5 hours with lean proteins, complex carbs and healthy fats. Minimize simple carbs, has worked hard and given up all alcohol, all sodas and has cut down his portions.

## 2024-11-10 ENCOUNTER — Ambulatory Visit: Payer: Self-pay | Admitting: Family

## 2024-11-10 ENCOUNTER — Encounter: Payer: Self-pay | Admitting: Family Medicine

## 2024-11-10 ENCOUNTER — Ambulatory Visit: Admitting: Family Medicine

## 2024-11-10 VITALS — BP 136/82 | HR 68 | Temp 97.7°F | Resp 16 | Ht 72.0 in | Wt 261.8 lb

## 2024-11-10 DIAGNOSIS — Z720 Tobacco use: Secondary | ICD-10-CM

## 2024-11-10 DIAGNOSIS — E669 Obesity, unspecified: Secondary | ICD-10-CM | POA: Diagnosis not present

## 2024-11-10 DIAGNOSIS — G43009 Migraine without aura, not intractable, without status migrainosus: Secondary | ICD-10-CM

## 2024-11-10 DIAGNOSIS — Z7985 Long-term (current) use of injectable non-insulin antidiabetic drugs: Secondary | ICD-10-CM

## 2024-11-10 DIAGNOSIS — E782 Mixed hyperlipidemia: Secondary | ICD-10-CM

## 2024-11-10 DIAGNOSIS — E119 Type 2 diabetes mellitus without complications: Secondary | ICD-10-CM | POA: Diagnosis not present

## 2024-11-10 DIAGNOSIS — E6609 Other obesity due to excess calories: Secondary | ICD-10-CM

## 2024-11-10 DIAGNOSIS — R351 Nocturia: Secondary | ICD-10-CM

## 2024-11-10 DIAGNOSIS — Z23 Encounter for immunization: Secondary | ICD-10-CM | POA: Diagnosis not present

## 2024-11-10 DIAGNOSIS — R519 Headache, unspecified: Secondary | ICD-10-CM

## 2024-11-10 LAB — MICROALBUMIN / CREATININE URINE RATIO
Creatinine,U: 172 mg/dL
Microalb Creat Ratio: 4.1 mg/g (ref 0.0–30.0)
Microalb, Ur: 0.7 mg/dL (ref 0.0–1.9)

## 2024-11-10 LAB — COMPREHENSIVE METABOLIC PANEL WITH GFR
ALT: 26 U/L (ref 0–53)
AST: 18 U/L (ref 0–37)
Albumin: 4.3 g/dL (ref 3.5–5.2)
Alkaline Phosphatase: 59 U/L (ref 39–117)
BUN: 17 mg/dL (ref 6–23)
CO2: 31 meq/L (ref 19–32)
Calcium: 9.3 mg/dL (ref 8.4–10.5)
Chloride: 99 meq/L (ref 96–112)
Creatinine, Ser: 1.06 mg/dL (ref 0.40–1.50)
GFR: 80.33 mL/min (ref >60.00)
Glucose, Bld: 155 mg/dL — ABNORMAL HIGH (ref 70–99)
Potassium: 4.6 meq/L (ref 3.5–5.1)
Sodium: 136 meq/L (ref 135–145)
Total Bilirubin: 0.6 mg/dL (ref 0.2–1.2)
Total Protein: 6.8 g/dL (ref 6.0–8.3)

## 2024-11-10 LAB — LIPID PANEL
Cholesterol: 173 mg/dL (ref 0–200)
HDL: 55.5 mg/dL (ref >39.00)
LDL Cholesterol: 79 mg/dL (ref 0–99)
NonHDL: 117.31
Total CHOL/HDL Ratio: 3
Triglycerides: 190 mg/dL — ABNORMAL HIGH (ref 0.0–149.0)
VLDL: 38 mg/dL (ref 0.0–40.0)

## 2024-11-10 LAB — CBC WITH DIFFERENTIAL/PLATELET
Basophils Absolute: 0.1 K/uL (ref 0.0–0.1)
Basophils Relative: 1 % (ref 0.0–3.0)
Eosinophils Absolute: 0.2 K/uL (ref 0.0–0.7)
Eosinophils Relative: 2.4 % (ref 0.0–5.0)
HCT: 48.2 % (ref 39.0–52.0)
Hemoglobin: 16.4 g/dL (ref 13.0–17.0)
Lymphocytes Relative: 34.6 % (ref 12.0–46.0)
Lymphs Abs: 2.5 K/uL (ref 0.7–4.0)
MCHC: 34.1 g/dL (ref 30.0–36.0)
MCV: 95.7 fl (ref 78.0–100.0)
Monocytes Absolute: 0.5 K/uL (ref 0.1–1.0)
Monocytes Relative: 6.5 % (ref 3.0–12.0)
Neutro Abs: 3.9 K/uL (ref 1.4–7.7)
Neutrophils Relative %: 55.5 % (ref 43.0–77.0)
Platelets: 204 K/uL (ref 150.0–400.0)
RBC: 5.04 Mil/uL (ref 4.22–5.81)
RDW: 14 % (ref 11.5–15.5)
WBC: 7.1 K/uL (ref 4.0–10.5)

## 2024-11-10 LAB — TSH: TSH: 2 u[IU]/mL (ref 0.35–5.50)

## 2024-11-10 LAB — PSA: PSA: 0.25 ng/mL (ref 0.10–4.00)

## 2024-11-10 LAB — HEMOGLOBIN A1C: Hgb A1c MFr Bld: 7.3 % — ABNORMAL HIGH (ref 4.6–6.5)

## 2024-11-10 MED ORDER — SEMAGLUTIDE-WEIGHT MANAGEMENT 0.5 MG/0.5ML ~~LOC~~ SOAJ: 0.5000 mg | SUBCUTANEOUS | 5 refills | Status: DC

## 2024-11-10 NOTE — Patient Instructions (Signed)
 Shingrix is the new shingles shot, 2 shots over 2-6 months, confirm coverage with insurance and document, then can return here for shots with nurse appt or at pharmacy   RSV, Respiratory Syncitial Virus vaccine, Arexvy at pharmacy   Tetanus is due end of January 2026, sooner if injured  Annual Covid and flu vaccines

## 2024-11-12 ENCOUNTER — Other Ambulatory Visit

## 2024-11-12 DIAGNOSIS — E669 Obesity, unspecified: Secondary | ICD-10-CM

## 2024-11-12 DIAGNOSIS — E119 Type 2 diabetes mellitus without complications: Secondary | ICD-10-CM

## 2024-11-12 MED ORDER — PRAVASTATIN SODIUM 20 MG PO TABS: 20.0000 mg | ORAL_TABLET | Freq: Every day | ORAL | 1 refills | Status: AC

## 2024-11-12 MED ORDER — VALSARTAN 80 MG PO TABS: 80.0000 mg | ORAL_TABLET | Freq: Every day | ORAL | 1 refills | Status: AC

## 2024-11-12 NOTE — Progress Notes (Signed)
 11/12/2024 Name: Gary Short. MRN: [id number] DOB: 27-Dec-1970  Chief Complaint  Patient presents with   Diabetes   Medication Management    Gary Short. is a 53 y.o. year old male who presented for a telephone visit.   They were referred to the pharmacist by their PCP for assistance in managing diabetes and medication access.    Subjective:  Care Team: Primary Care Provider: Domenica Harlene LABOR, MD ; Next Scheduled Visit: 04/2025  Medication Access/Adherence  Current Pharmacy:  [facility] [address], [place] - [address] [place] [address] Phone: [phone] Fax: [phone]  [facility] [address], [place] - [address] [place] [address] Phone: [phone] Fax: [phone]   Patient reports affordability concerns with their medications: Yes  - he does not have any prescription insurance Patient reports access/transportation concerns to their pharmacy: [place] and sometimes has car issues which makes it difficult to keep appointments. Patient reports adherence concerns with their medications:  No      Diabetes:  Current medications: Ozempic  0.25mg  weekly - he will increase to 0.5mg  weekly with next dose on 11/16/2024 and stop metformin  He did try 0.5mg  Ozempic  in past and had nausea but he would like to try 0.5mg  weekly again.  Medications tried in the past: metformin  - stopped with higher dose of Ozempic   Wt Readings from Last 3 Encounters:  11/10/24 261 lb 12.8 oz (118.8 kg)  12/10/23 268 lb (121.6 kg)  12/06/23 260 lb (117.9 kg)     Patient denies hypoglycemic s/sx including no dizziness, shakiness, sweating. Patient denies hyperglycemic symptoms including no polyuria, polydipsia, polyphagia, nocturia, neuropathy, blurred vision.   Current medication access support: gets Ozempic  from [employer] thru 11/26/2024 - he just picked up 4 boxes  11/10/2024  Macrovascular and Microvascular Risk Reduction:  Statin? yes (pravastatin  ); ACEi/ARB? yes (valsartan  80mg ) Last urinary albumin/creatinine ratio:  Lab Results  Component Value Date   MICRALBCREAT 4.1 11/10/2024   Last eye exam:   Last foot exam: No foot exam found Tobacco Use:  Tobacco Use: High Risk (11/10/2024)   Patient History    Smoking Tobacco Use: Every Day    Smokeless Tobacco Use: Never    Passive Exposure: Not on file     Objective:  BP Readings from Last 3 Encounters:  11/10/24 136/82  12/10/23 (!) 145/85  05/01/23 134/82    Lab Results  Component Value Date   HGBA1C 7.3 (H) 11/10/2024    Lab Results  Component Value Date   CREATININE 1.06 11/10/2024   BUN 17 11/10/2024   NA 136 11/10/2024   K 4.6 11/10/2024   CL 99 11/10/2024   CO2 31 11/10/2024    Lab Results  Component Value Date   CHOL 173 11/10/2024   HDL 55.50 11/10/2024   LDLCALC 79 11/10/2024   LDLDIRECT 117.0 10/30/2022   TRIG 190.0 (H) 11/10/2024   CHOLHDL 3 11/10/2024    Medications Reviewed Today     Reviewed by Carla Milling, RPH-CPP (Pharmacist) on 11/12/24 at 1105  Med List Status: <None>   Medication Order Taking? Sig Documenting Provider Last Dose Status Informant  aspirin  EC 81 MG tablet [id number] Yes Take 1 tablet (81 mg total) by mouth daily. Domenica Harlene LABOR, MD  Active   glucose blood (BAYER CONTOUR TEST) test strip [id number] Yes Check blood glucose once daily and as needed. Dx: E11.9 Domenica,  Harlene LABOR, MD  Active   HYDROcodone -acetaminophen  (NORCO) 7.5-325 MG tablet [id number] Yes Take 1-2 tablets by mouth every 6 (six) hours as needed. Domenica Harlene LABOR, MD  Active   MICROLET LANCETS MISC [id number] Yes Check blood sugar DX E11.9 Domenica Harlene LABOR, MD  Active   nitroGLYCERIN  (NITROSTAT ) 0.4 MG SL tablet [id number]  Place 1 tablet (0.4 mg total) under the tongue every 5 (five) minutes as needed for chest pain. Domenica Harlene LABOR, MD  Active   pravastatin  (PRAVACHOL ) 20  MG tablet [id number] Yes Take 1 tablet (20 mg total) by mouth daily. Domenica Harlene LABOR, MD  Active   Semaglutide ,0.25 or 0.5MG /DOS, (OZEMPIC , 0.25 OR 0.5 MG/DOSE,) 2 MG/3ML SOPN [id number] Yes Inject 0.25 mg into the skin once a week. Domenica Harlene LABOR, MD  Active            Med Note [facility], Wilson Dusenbery B   Tue Mar 04, 2024 10:04 AM) [employer] medication assistance program thru 11/26/2024    Discontinued 11/12/24 1105 (Entry Error)   sildenafil  (REVATIO ) 20 MG tablet [id number]  TAKE 1 TO 5 TABLETS BY MOUTH ONCE DAILY AS NEEDED FOR ERECTILE DYSFUNCTION Domenica Harlene LABOR, MD  Active   valsartan  (DIOVAN ) 80 MG tablet [id number] Yes Take 1 tablet (80 mg total) by mouth daily. Domenica Harlene LABOR, MD  Active               Assessment/Plan:   Diabetes: - Currently not at A1c goal of < 7% but A1c has improved since he started Ozempic . Cardiorenal risk reduction is optimized.. Blood pressure is at goal <130/80. LDL is not at goal of < 70 but improved from 107 to 79 - Reviewed long term cardiovascular and renal outcomes of uncontrolled blood sugar. and Reviewed goal A1c, goal fasting, and goal 2 hour post prandial glucose.  - Recommend to start Ozempic  0.5mg  weekly as planned by Dr Domenica. Patient to stop metformin  . - Discussed side effects of gastrointestinal upset/nausea; eating smaller meals, avoiding high-fat foods, and remaining upright after eating may reduce nausea. Discussed that overeating is a major trigger of nausea with this class of medications, as often times patients will start to feel full sooner and may need to decrease portion sizes from what they were previously accustomed to.   Meds ordered this encounter  Medications   valsartan  (DIOVAN ) 80 MG tablet    Sig: Take 1 tablet (80 mg total) by mouth daily.    Dispense:  90 tablet    Refill:  1    Please cancel any previous prescriptions - dose increase. Profile until patient requests.   pravastatin  (PRAVACHOL ) 20 MG tablet    Sig: Take 1 tablet (20 mg  total) by mouth daily.    Dispense:  90 tablet    Refill:  1     Will request Med Assist Team resend patient assistance program application for Ozempic  / [employer] to patient.   Follow Up Plan: 2 months    Madelin Ray, PharmD Clinical Pharmacist [facility]

## 2024-11-13 ENCOUNTER — Telehealth: Payer: Self-pay

## 2024-11-13 NOTE — Telephone Encounter (Signed)
 PAP: Patient assistance application for Ozempic  through [employer] has been mailed to pt's home address on file. Provider portion of application will be faxed to provider's office. Patient portion e-filed.

## 2024-12-10 ENCOUNTER — Encounter: Payer: Self-pay | Admitting: Pharmacist

## 2024-12-11 ENCOUNTER — Ambulatory Visit: Payer: Medicare Other

## 2024-12-11 VITALS — Ht 73.0 in | Wt 261.0 lb

## 2024-12-11 DIAGNOSIS — Z0000 Encounter for general adult medical examination without abnormal findings: Secondary | ICD-10-CM | POA: Diagnosis not present

## 2024-12-11 DIAGNOSIS — Z1211 Encounter for screening for malignant neoplasm of colon: Secondary | ICD-10-CM | POA: Diagnosis not present

## 2024-12-11 NOTE — Progress Notes (Signed)
 "  Chief Complaint  Patient presents with   Medicare Wellness     Subjective:   Gary Short. is a 54 y.o. male who presents for a Medicare Annual Wellness Visit.  Visit info / Clinical Intake: Medicare Wellness Visit Type:: Subsequent Annual Wellness Visit Persons participating in visit and providing information:: patient Medicare Wellness Visit Mode:: Video Since this visit was completed virtually, some vitals may be partially provided or unavailable. Missing vitals are due to the limitations of the virtual format.: Documented vitals are patient reported If Telephone or Video please confirm:: I connected with patient using audio/video enable telemedicine. I verified patient identity with two identifiers, discussed telehealth limitations, and patient agreed to proceed. Patient Location:: Home Provider Location:: Office Interpreter Needed?: No Pre-visit prep was completed: yes AWV questionnaire completed by patient prior to visit?: no Living arrangements:: lives with spouse/significant other Patient's Overall Health Status Rating: very good Typical amount of pain: (!) a lot Does pain affect daily life?: (!) yes (Followed by medical attention) Are you currently prescribed opioids?: (!) yes  Dietary Habits and Nutritional Risks How many meals a day?: 2 Eats fruit and vegetables daily?: yes Most meals are obtained by: preparing own meals In the last 2 weeks, have you had any of the following?: none Diabetic:: (!) yes Any non-healing wounds?: no How often do you check your BS?: 3 (Weekly) Would you like to be referred to a Nutritionist or for Diabetic Management? : no  Functional Status Activities of Daily Living (to include ambulation/medication): Independent Ambulation: Independent with device- listed below Home Assistive Devices/Equipment: Eyeglasses; Other (Comment) (Hearing Aids) Medication Administration: Independent Home Management (perform basic housework or laundry):  Independent Manage your own finances?: yes Primary transportation is: driving Concerns about vision?: no *vision screening is required for WTM* Concerns about hearing?: (!) yes Uses hearing aids?: (!) yes Hear whispered voice?: (!) no *in-person visit only*  Fall Screening Falls in the past year?: 1 Number of falls in past year: 1 Was there an injury with Fall?: 0 Fall Risk Category Calculator: 2 Patient Fall Risk Level: Moderate Fall Risk  Fall Risk Patient at Risk for Falls Due to: History of fall(s); Impaired balance/gait Fall risk Follow up: Falls evaluation completed; Education provided  Home and Transportation Safety: All rugs have non-skid backing?: N/A, no rugs All stairs or steps have railings?: yes Grab bars in the bathtub or shower?: (!) no Have non-skid surface in bathtub or shower?: yes Good home lighting?: yes Regular seat belt use?: yes Hospital stays in the last year:: no  Cognitive Assessment Difficulty concentrating, remembering, or making decisions? : yes Will 6CIT or Mini Cog be Completed: yes What year is it?: 0 points What month is it?: 0 points Give patient an address phrase to remember (5 components): [address] [place]  About what time is it?: 0 points Count backwards from 20 to 1: 0 points Say the months of the year in reverse: 0 points Repeat the address phrase from earlier: 0 points 6 CIT Score: 0 points  Advance Directives (For Healthcare) Does Patient Have a Medical Advance Directive?: No Would patient like information on creating a medical advance directive?: No - Patient declined  Reviewed/Updated  Reviewed/Updated: Reviewed All (Medical, Surgical, Family, Medications, Allergies, Care Teams, Patient Goals)    Allergies (verified) Iodine, Wasp venom, and Iodides   Current Medications (verified) Outpatient Encounter Medications as of 12/11/2024  Medication Sig   aspirin  EC 81 MG tablet Take 1 tablet (81 mg  total) by mouth  daily.   glucose blood (BAYER CONTOUR TEST) test strip Check blood glucose once daily and as needed. Dx: E11.9   HYDROcodone -acetaminophen  (NORCO) 7.5-325 MG tablet Take 1-2 tablets by mouth every 6 (six) hours as needed.   MICROLET LANCETS MISC Check blood sugar DX E11.9   nitroGLYCERIN  (NITROSTAT ) 0.4 MG SL tablet Place 1 tablet (0.4 mg total) under the tongue every 5 (five) minutes as needed for chest pain.   pravastatin  (PRAVACHOL ) 20 MG tablet Take 1 tablet (20 mg total) by mouth daily.   Semaglutide ,0.25 or 0.5MG /DOS, (OZEMPIC , 0.25 OR 0.5 MG/DOSE,) 2 MG/3ML SOPN Inject 0.5 mg into the skin once a week.   sildenafil  (REVATIO ) 20 MG tablet TAKE 1 TO 5 TABLETS BY MOUTH ONCE DAILY AS NEEDED FOR ERECTILE DYSFUNCTION   valsartan  (DIOVAN ) 80 MG tablet Take 1 tablet (80 mg total) by mouth daily.   No facility-administered encounter medications on file as of 12/11/2024.    History: Past Medical History:  Diagnosis Date   Atypical chest pain 05/08/2017   Back pain 06/13/2010   Qualifier: Diagnosis of  By: Domenica MD, Harlene Duane back at work in 2002, was able to work until 2004. Ultimately had  Surgeries to his lower back twice and after complications with a spinal leak, was never able to return to work Radicular symptoms occur b/l.    Knee pain, bilateral 08/26/2014   Medicare annual wellness visit, subsequent 05/07/2016   Obesity, unspecified 06/13/2010   Qualifier: Diagnosis of  By: Domenica MD, Harlene     Past Surgical History:  Procedure Laterality Date   BACK SURGERY     X 2   KNEE SURGERY  1999   cartilage repair   Family History  Problem Relation Age of Onset   Cancer Mother        breast   Diabetes Mother    Hyperlipidemia Mother    Hypertension Mother    Stroke Father    Diabetes Father    Hyperlipidemia Father    Hypertension Father    Diabetes Sister    Arthritis Sister    Diabetes Sister    Cancer Maternal Uncle        throat, stomach   Cancer Maternal Grandmother     Arthritis Other        family hx of   Diabetes Other        family hx of   Hypertension Other        family hx of   Stroke Other        family hx of   Sudden death Other        family hx of   Social History   Occupational History   Not on file  Tobacco Use   Smoking status: Every Day    Current packs/day: 0.50    Types: Cigarettes   Smokeless tobacco: Never  Substance and Sexual Activity   Alcohol use: No   Drug use: No   Sexual activity: Not on file   Tobacco Counseling Ready to quit: No Counseling given: Yes  SDOH Screenings   Food Insecurity: No Food Insecurity (12/11/2024)  Housing: Low Risk (12/11/2024)  Transportation Needs: No Transportation Needs (12/11/2024)  Utilities: Not At Risk (12/11/2024)  Alcohol Screen: Low Risk (12/06/2023)  Depression (PHQ2-9): Low Risk (12/11/2024)  Financial Resource Strain: Low Risk (12/06/2023)  Physical Activity: Inactive (12/11/2024)  Social Connections: Socially Integrated (12/11/2024)  Stress: No Stress Concern Present (12/11/2024)  Tobacco Use:  High Risk (12/11/2024)  Health Literacy: Adequate Health Literacy (12/11/2024)   See flowsheets for full screening details  Depression Screen PHQ 2 & 9 Depression Scale- Over the past 2 weeks, how often have you been bothered by any of the following problems? Little interest or pleasure in doing things: 0 Feeling down, depressed, or hopeless (PHQ Adolescent also includes...irritable): 0 PHQ-2 Total Score: 0     Goals Addressed   None          Objective:    Today's Vitals   12/11/24 1118  Weight: 261 lb (118.4 kg)  Height: 6' 1 (1.854 m)   Body mass index is 34.43 kg/m.  Hearing/Vision screen Hearing Screening - Comments:: Wears Hearing Aids Vision Screening - Comments:: Wears rx glasses - up to date with routine eye exams with  [facility] Immunizations and Health Maintenance Health Maintenance  Topic Date Due   FOOT EXAM  Never done   OPHTHALMOLOGY EXAM  Never  done   HIV Screening  Never done   Hepatitis C Screening  Never done   Hepatitis B Vaccines 19-59 Average Risk (1 of 3 - 19+ 3-dose series) Never done   Colonoscopy  Never done   Zoster Vaccines- Shingrix (1 of 2) Never done   Pneumococcal Vaccine: 50+ Years (3 of 3 - PCV20 or PCV21) 05/15/2023   COVID-19 Vaccine (1 - 2025-26 season) Never done   DTaP/Tdap/Td (2 - Td or Tdap) 12/24/2024   Diabetic kidney evaluation - Urine ACR  05/11/2025   HEMOGLOBIN A1C  05/11/2025   Diabetic kidney evaluation - eGFR measurement  11/10/2025   Medicare Annual Wellness (AWV)  12/11/2025   Influenza Vaccine  Completed   HPV VACCINES (No Doses Required) Completed   Meningococcal B Vaccine  Aged Out        Assessment/Plan:  This is a routine wellness examination for Gary Short.  Patient Care Team: Domenica Harlene LABOR, MD as PCP - General Leandrew, Lamar POUR, MD as Referring Physician (Gastroenterology)  I have personally reviewed and noted the following in the patients chart:   Medical and social history Use of alcohol, tobacco or illicit drugs  Current medications and supplements including opioid prescriptions. Functional ability and status Nutritional status Physical activity Advanced directives List of other physicians Hospitalizations, surgeries, and ER visits in previous 12 months Vitals Screenings to include cognitive, depression, and falls Referrals and appointments  Orders Placed This Encounter  Procedures   Ambulatory referral to Gastroenterology    Referral Priority:   Routine    Referral Type:   Consultation    Referral Reason:   Specialty Services Required    Number of Visits Requested:   1   In addition, I have reviewed and discussed with patient certain preventive protocols, quality metrics, and best practice recommendations. A written personalized care plan for preventive services as well as general preventive health recommendations were provided to patient.   Gary LELON Blush, LPN   [id number]/84/7973   Return in 53 weeks (on 12/17/2025).  After Visit Summary: (MyChart) Due to this being a telephonic visit, the after visit summary with patients personalized plan was offered to patient via MyChart   Nurse Notes: HM Addressed: Referral sent to GI for colonoscopy Labs and vaccines deferred. "

## 2024-12-11 NOTE — Patient Instructions (Addendum)
 [name],  Thank you for taking the time for your Medicare Wellness Visit. I appreciate your continued commitment to your health goals. Please review the care plan we discussed, and feel free to reach out if I can assist you further.  Please note that Annual Wellness Visits do not include a physical exam. Some assessments may be limited, especially if the visit was conducted virtually. If needed, we may recommend an in-person follow-up with your provider.  Ongoing Care Seeing your primary care provider every 3 to 6 months helps us  monitor your health and provide consistent, personalized care.   Referrals If a referral was made during today's visit and you haven't received any updates within two weeks, please contact the referred provider directly to check on the status.  Recommended Screenings:  Health Maintenance  Topic Date Due   Complete foot exam   Never done   Eye exam for diabetics  Never done   HIV Screening  Never done   Hepatitis C Screening  Never done   Hepatitis B Vaccine (1 of 3 - 19+ 3-dose series) Never done   Colon Cancer Screening  Never done   Zoster (Shingles) Vaccine (1 of 2) Never done   Pneumococcal Vaccine for age over 33 (3 of 3 - PCV20 or PCV21) 05/15/2023   COVID-19 Vaccine (1 - 2025-26 season) Never done   DTaP/Tdap/Td vaccine (2 - Td or Tdap) 12/24/2024   Kidney health urinalysis for diabetes  05/11/2025   Hemoglobin A1C  05/11/2025   Yearly kidney function blood test for diabetes  11/10/2025   Medicare Annual Wellness Visit  12/11/2025   Flu Shot  Completed   HPV Vaccine (No Doses Required) Completed   Meningitis B Vaccine  Aged Out   Opioid Pain Medicine Management Opioids are powerful medicines that are used to treat moderate to severe pain. When used for short periods of time, they can help you to: Sleep better. Do better in physical or occupational therapy. Feel better in the first few days after an injury. Recover from surgery. Opioids should  be taken with the supervision of a trained health care provider. They should be taken for the shortest period of time possible. This is because opioids can be addictive, and the longer you take opioids, the greater your risk of addiction. This addiction can also be called opioid use disorder. What are the risks? Using opioid pain medicines for longer than 3 days increases your risk of side effects. Side effects include: Constipation. Nausea and vomiting. Breathing difficulties (respiratory depression). Drowsiness. Confusion. Opioid use disorder. Itching. Taking opioid pain medicine for a long period of time can affect your ability to do daily tasks. It also puts you at risk for: Motor vehicle crashes. Depression. Suicide. Heart attack. Overdose, which can be life-threatening. What is a pain treatment plan? A pain treatment plan is an agreement between you and your health care provider. Pain is unique to each person, and treatments vary depending on your condition. To manage your pain, you and your health care provider need to work together. To help you do this: Discuss the goals of your treatment, including how much pain you might expect to have and how you will manage the pain. Review the risks and benefits of taking opioid medicines. Remember that a good treatment plan uses more than one approach and minimizes the chance of side effects. Be honest about the amount of medicines you take and about any drug or alcohol use. Get pain medicine prescriptions from  only one health care provider. Pain can be managed with many types of alternative treatments. Ask your health care provider to refer you to one or more specialists who can help you manage pain through: Physical or occupational therapy. Counseling (cognitive behavioral therapy). Good nutrition. Biofeedback. Massage. Meditation. Non-opioid medicine. Following a gentle exercise program. How to use opioid pain medicine Taking  medicine Take your pain medicine exactly as told by your health care provider. Take it only when you need it. If your pain gets less severe, you may take less than your prescribed dose if your health care provider approves. If you are not having pain, do nottake pain medicine unless your health care provider tells you to take it. If your pain is severe, do nottry to treat it yourself by taking more pills than instructed on your prescription. Contact your health care provider for help. Write down the times when you take your pain medicine. It is easy to become confused while on pain medicine. Writing the time can help you avoid overdose. Take other over-the-counter or prescription medicines only as told by your health care provider. Keeping yourself and others safe  While you are taking opioid pain medicine: Do not drive, use machinery, or power tools. Do not sign legal documents. Do not drink alcohol. Do not take sleeping pills. Do not supervise children by yourself. Do not do activities that require climbing or being in high places. Do not go to a lake, river, ocean, spa, or swimming pool. Do not share your pain medicine with anyone. Keep pain medicine in a locked cabinet or in a secure area where pets and children cannot reach it. Stopping your use of opioids If you have been taking opioid medicine for more than a few weeks, you may need to slowly decrease (taper) how much you take until you stop completely. Tapering your use of opioids can decrease your risk of symptoms of withdrawal, such as: Pain and cramping in the abdomen. Nausea. Sweating. Sleepiness. Restlessness. Uncontrollable shaking (tremors). Cravings for the medicine. Do not attempt to taper your use of opioids on your own. Talk with your health care provider about how to do this. Your health care provider may prescribe a step-down schedule based on how much medicine you are taking and how long you have been taking  it. Getting rid of leftover pills Do not save any leftover pills. Get rid of leftover pills safely by: Taking the medicine to a prescription take-back program. This is usually offered by the county or law enforcement. Bringing them to a pharmacy that has a drug disposal container. Flushing them down the toilet. Check the label or package insert of your medicine to see whether this is safe to do. Throwing them out in the trash. Check the label or package insert of your medicine to see whether this is safe to do. If it is safe to throw it out, remove the medicine from the original container, put it into a sealable bag or container, and mix it with used coffee grounds, food scraps, dirt, or cat litter before putting it in the trash. Follow these instructions at home: Activity Do exercises as told by your health care provider. Avoid activities that make your pain worse. Return to your normal activities as told by your health care provider. Ask your health care provider what activities are safe for you. General instructions You may need to take these actions to prevent or treat constipation: Drink enough fluid to keep your urine pale yellow.  Take over-the-counter or prescription medicines. Eat foods that are high in fiber, such as beans, whole grains, and fresh fruits and vegetables. Limit foods that are high in fat and processed sugars, such as fried or sweet foods. Keep all follow-up visits. This is important. Where to find support If you have been taking opioids for a long time, you may benefit from receiving support for quitting from a local support group or counselor. Ask your health care provider for a referral to these resources in your area. Where to find more information Centers for Disease Control and Prevention (CDC): footballexhibition.com.br [place] Food and Drug Administration (FDA): pumpkinsearch.com.ee Get help right away if: You may have taken too much of an opioid (overdosed). Common symptoms of an  overdose: Your breathing is slower or more shallow than normal. You have a very slow heartbeat (pulse). You have slurred speech. You have nausea and vomiting. Your pupils become very small. You have other potential symptoms: You are very confused. You faint or feel like you will faint. You have cold, clammy skin. You have blue lips or fingernails. You have thoughts of harming yourself or harming others. These symptoms may represent a serious problem that is an emergency. Do not wait to see if the symptoms will go away. Get medical help right away. Call your local emergency services (911 in the [place] Do not drive yourself to the hospital.  If you ever feel like you may hurt yourself or others, or have thoughts about taking your own life, get help right away. Go to your nearest emergency department or: Call your local emergency services (911 in the [place] Call the [facility] (612-169-3389 in the [place] Call a suicide crisis helpline, such as the National Suicide Prevention Lifeline at 3152352131 or 988 in the [place] This is open 24 hours a day in the [place] If youre a Veteran: Call 988 and press 1. This is open 24 hours a day. Text the [employer] at (617)728-1261. Summary Opioid medicines can help you manage moderate to severe pain for a short period of time. A pain treatment plan is an agreement between you and your health care provider. Discuss the goals of your treatment, including how much pain you might expect to have and how you will manage the pain. If you think that you or someone else may have taken too much of an opioid, get medical help right away. This information is not intended to replace advice given to you by your health care provider. Make sure you discuss any questions you have with your health care provider. Document Revised: 08/20/2023 Document Reviewed: 02/23/2021 Elsevier Patient Education  2024 Elsevier Inc.    12/11/2024   11:24 AM   Advanced Directives  Does Patient Have a Medical Advance Directive? No  Would patient like information on creating a medical advance directive? No - Patient declined    Vision: Annual vision screenings are recommended for early detection of glaucoma, cataracts, and diabetic retinopathy. These exams can also reveal signs of chronic conditions such as diabetes and high blood pressure.  Dental: Annual dental screenings help detect early signs of oral cancer, gum disease, and other conditions linked to overall health, including heart disease and diabetes.  Please see the attached documents for additional preventive care recommendations.

## 2024-12-16 NOTE — Telephone Encounter (Signed)
 PAP: Patient assistance application for Ozempic  has been approved by PAP Companies: [employer] from 11/27/2024 to 01/08/2025. Medication should be delivered to PAP Delivery: Provider's office. For further shipping updates, please contact [employer] at [phone]. Patient ID is: Not provided.

## 2024-12-24 ENCOUNTER — Other Ambulatory Visit: Admitting: Pharmacist

## 2024-12-24 NOTE — Progress Notes (Signed)
 "  12/24/2024 Name: Gary Short Glendia Mickey. MRN: [id number] DOB: Jul 05, 1971  Chief Complaint  Patient presents with   Medication Management    Ozempic  PAP    Gary Short Gary Short. is a 54 y.o. year old male who presented for a telephone visit.   They were referred to the pharmacist by their PCP for assistance in managing diabetes and medication access.    Subjective:  Care Team: Primary Care Provider: Domenica Harlene LABOR, MD ; Next Scheduled Visit: 04/2025  Medication Access/Adherence  Current Pharmacy:  [facility] [address] [place] - [address] [place] [address] Phone: [phone] Fax: [phone]  [facility] [address] [place] - [address] [place] [address] Phone: [phone] Fax: [phone]   Patient reports affordability concerns with their medications: Yes  - he does not have any prescription insurance Patient reports access/transportation concerns to their pharmacy: [place] and sometimes has car issues which makes it difficult to keep appointments. Patient reports adherence concerns with their medications:  No      Diabetes:  Current medications: Ozempic  0.5mg  weekly  Checking blood glucose every other day - 110 to 120's  Medications tried in the past: metformin  - stopped with higher dose of Ozempic   Wt Readings from Last 3 Encounters:  12/11/24 261 lb (118.4 kg)  11/10/24 261 lb 12.8 oz (118.8 kg)  12/10/23 268 lb (121.6 kg)     Patient denies hypoglycemic s/sx including no dizziness, shakiness, sweating. Patient denies hyperglycemic symptoms including no polyuria, polydipsia, polyphagia, nocturia, neuropathy, blurred vision.   Current medication access support: gets Ozempic  from [employer] thru 01/08/2025. He has received letter that he needs provider portion resent for 2026 application.  Patient reports today he has 4 Ozempic  pens on hand.   Macrovascular and  Microvascular Risk Reduction:  Statin? yes (pravastatin  ); ACEi/ARB? yes (valsartan  80mg ) Last urinary albumin/creatinine ratio:  Lab Results  Component Value Date   MICRALBCREAT 4.1 11/10/2024   Last eye exam:   Last foot exam: No foot exam found Tobacco Use:  Tobacco Use: High Risk (12/11/2024)   Patient History    Smoking Tobacco Use: Every Day    Smokeless Tobacco Use: Never    Passive Exposure: Not on file     Objective:  BP Readings from Last 3 Encounters:  11/10/24 136/82  12/10/23 (!) 145/85  05/01/23 134/82    Lab Results  Component Value Date   HGBA1C 7.3 (H) 11/10/2024    Lab Results  Component Value Date   CREATININE 1.06 11/10/2024   BUN 17 11/10/2024   NA 136 11/10/2024   K 4.6 11/10/2024   CL 99 11/10/2024   CO2 31 11/10/2024    Lab Results  Component Value Date   CHOL 173 11/10/2024   HDL 55.50 11/10/2024   LDLCALC 79 11/10/2024   LDLDIRECT 117.0 10/30/2022   TRIG 190.0 (H) 11/10/2024   CHOLHDL 3 11/10/2024    Medications Reviewed Today     Reviewed by Carla Milling, RPH-CPP (Pharmacist) on 12/24/24 at 1555  Med List Status: <None>   Medication Order Taking? Sig Documenting Provider Last Dose Status Informant  aspirin  EC 81 MG tablet [id number]  Take 1 tablet (81 mg total) by mouth daily. Domenica Harlene LABOR, MD  Active   glucose blood (BAYER CONTOUR TEST) test strip [id number]  Check blood glucose once daily and as needed. Dx: E11.9 Domenica Harlene LABOR, MD  Active   HYDROcodone -acetaminophen  (NORCO) 7.5-325 MG tablet [id number]  Take 1-2 tablets by mouth every 6 (six) hours as needed. Domenica Harlene LABOR, MD  Active   MICROLET LANCETS MISC [id number]  Check blood sugar DX E11.9 Domenica Harlene LABOR, MD  Active   nitroGLYCERIN  (NITROSTAT ) 0.4 MG SL tablet [id number] No Place 1 tablet (0.4 mg total) under the tongue every 5 (five) minutes as needed for chest pain. Domenica Harlene LABOR, MD Taking Active   pravastatin  (PRAVACHOL ) 20 MG tablet [id number]  Take 1 tablet  (20 mg total) by mouth daily. Domenica Harlene LABOR, MD  Active   Semaglutide ,0.25 or 0.5MG /DOS, (OZEMPIC , 0.25 OR 0.5 MG/DOSE,) 2 MG/3ML SOPN [id number]  Inject 0.5 mg into the skin once a week. Domenica Harlene LABOR, MD  Active   sildenafil  (REVATIO ) 20 MG tablet [id number]  TAKE 1 TO 5 TABLETS BY MOUTH ONCE DAILY AS NEEDED FOR ERECTILE DYSFUNCTION Domenica Harlene LABOR, MD  Active   valsartan  (DIOVAN ) 80 MG tablet [id number]  Take 1 tablet (80 mg total) by mouth daily. Domenica Harlene LABOR, MD  Active               Assessment/Plan:   Diabetes: - Currently not at A1c goal of < 7% but A1c has improved since he started Ozempic . Cardiorenal risk reduction is optimized.. Blood pressure is at goal <130/80. LDL is not at goal of < 70 but improved from 107 to 79 - Reviewed long term cardiovascular and renal outcomes of uncontrolled blood sugar. and Reviewed goal A1c, goal fasting, and goal 2 hour post prandial glucose.  - Recommend to continue Ozempic  0.5mg  weekly . - Discussed side effects of gastrointestinal upset/nausea; eating smaller meals, avoiding high-fat foods, and remaining upright after eating may reduce nausea. Discussed that overeating is a major trigger of nausea with this class of medications, as often times patients will start to feel full sooner and may need to decrease portion sizes from what they were previously accustomed to.   Completed provider portion of Ozempic  patient assistance program and forwarded to PCP to review and sign.   Follow Up Plan: 1 month   Madelin Ray, PharmD Clinical Pharmacist [facility]    "

## 2024-12-31 ENCOUNTER — Other Ambulatory Visit: Admitting: Pharmacist

## 2024-12-31 NOTE — Progress Notes (Signed)
 "  12/31/2024 Name: Gary Short. MRN: [id number] DOB: 1970-12-03  No chief complaint on file.   Gary Shouse. is a 54 y.o. year old male who presented for a telephone visit.   They were referred to the pharmacist by their PCP for assistance in managing diabetes and medication access.    Subjective:  Care Team: Primary Care Provider: Domenica Harlene LABOR, MD ; Next Scheduled Visit: 04/2025  Medication Access/Adherence  Current Pharmacy:  [facility] [address], [place] - [address] [place] [address] Phone: [phone] Fax: [phone]  [facility] [address] [place] - [address] [place] [address] Phone: [phone] Fax: [phone]   Patient reports affordability concerns with their medications: Yes  - he does not have any prescription insurance Patient reports access/transportation concerns to their pharmacy: [place] and sometimes has car issues which makes it difficult to keep appointments. Patient reports adherence concerns with their medications:  No      Diabetes:  Current medications: Ozempic  0.5mg  weekly  Checking blood glucose every other day - 115, 126, 120, 108, 135 Medications tried in the past: metformin  - stopped with higher dose of Ozempic   Wt Readings from Last 3 Encounters:  12/11/24 261 lb (118.4 kg)  11/10/24 261 lb 12.8 oz (118.8 kg)  12/10/23 268 lb (121.6 kg)     Patient denies hypoglycemic s/sx including no dizziness, shakiness, sweating. Patient denies hyperglycemic symptoms including no polyuria, polydipsia, polyphagia, nocturia, neuropathy, blurred vision.   Current medication access support: gets Ozempic  from [employer] thru 01/08/2025. He had received letter that he needs provider portion resent for 2026 application.  Patient reports today he has 3 Ozempic  pens on hand.   Macrovascular and Microvascular Risk Reduction:  Statin? yes  (pravastatin  ); ACEi/ARB? yes (valsartan  80mg ) Last urinary albumin/creatinine ratio:  Lab Results  Component Value Date   MICRALBCREAT 4.1 11/10/2024   Last eye exam:   Last foot exam: No foot exam found Tobacco Use:  Tobacco Use: High Risk (12/11/2024)   Patient History    Smoking Tobacco Use: Every Day    Smokeless Tobacco Use: Never    Passive Exposure: Not on file     Objective:  BP Readings from Last 3 Encounters:  11/10/24 136/82  12/10/23 (!) 145/85  05/01/23 134/82    Lab Results  Component Value Date   HGBA1C 7.3 (H) 11/10/2024    Lab Results  Component Value Date   CREATININE 1.06 11/10/2024   BUN 17 11/10/2024   NA 136 11/10/2024   K 4.6 11/10/2024   CL 99 11/10/2024   CO2 31 11/10/2024    Lab Results  Component Value Date   CHOL 173 11/10/2024   HDL 55.50 11/10/2024   LDLCALC 79 11/10/2024   LDLDIRECT 117.0 10/30/2022   TRIG 190.0 (H) 11/10/2024   CHOLHDL 3 11/10/2024    Medications Reviewed Today     Reviewed by Carla Milling, RPH-CPP (Pharmacist) on 12/31/24 at 1347  Med List Status: <None>   Medication Order Taking? Sig Documenting Provider Last Dose Status Informant  aspirin  EC 81 MG tablet [id number]  Take 1 tablet (81 mg total) by mouth daily. Domenica Harlene LABOR, MD  Active   glucose blood (BAYER CONTOUR TEST) test strip [id number]  Check blood glucose once daily and as needed. Dx: E11.9 Domenica Harlene LABOR, MD  Active   HYDROcodone -acetaminophen  (NORCO) 7.5-325 MG tablet [id number]  Take 1-2 tablets by mouth every 6 (six) hours as needed. Domenica Harlene LABOR, MD  Active   MICROLET LANCETS MISC [id number]  Check blood sugar DX E11.9 Domenica Harlene LABOR, MD  Active   nitroGLYCERIN  (NITROSTAT ) 0.4 MG SL tablet [id number] No Place 1 tablet (0.4 mg total) under the tongue every 5 (five) minutes as needed for chest pain. Domenica Harlene LABOR, MD Taking Active   pravastatin  (PRAVACHOL ) 20 MG tablet [id number]  Take 1 tablet (20 mg total) by mouth daily. Domenica Harlene LABOR,  MD  Active   Semaglutide ,0.25 or 0.5MG /DOS, (OZEMPIC , 0.25 OR 0.5 MG/DOSE,) 2 MG/3ML SOPN [id number]  Inject 0.5 mg into the skin once a week. Domenica Harlene LABOR, MD  Active   sildenafil  (REVATIO ) 20 MG tablet [id number]  TAKE 1 TO 5 TABLETS BY MOUTH ONCE DAILY AS NEEDED FOR ERECTILE DYSFUNCTION Domenica Harlene LABOR, MD  Active   valsartan  (DIOVAN ) 80 MG tablet [id number]  Take 1 tablet (80 mg total) by mouth daily. Domenica Harlene LABOR, MD  Active               Assessment/Plan:   Diabetes: - Currently not at A1c goal of < 7% but A1c has improved since he started Ozempic . Cardiorenal risk reduction is optimized.. Blood pressure is at goal <130/80. LDL is not at goal of < 70 but improved from 107 to 79 - Reviewed long term cardiovascular and renal outcomes of uncontrolled blood sugar. and Reviewed goal A1c, goal fasting, and goal 2 hour post prandial glucose.  - Recommend to continue Ozempic  0.5mg  weekly . - Discussed side effects of gastrointestinal upset/nausea; eating smaller meals, avoiding high-fat foods, and remaining upright after eating may reduce nausea. Discussed that overeating is a major trigger of nausea with this class of medications, as often times patients will start to feel full sooner and may need to decrease portion sizes from what they were previously accustomed to.   Faxed updated application to [employer] today Follow Up Plan: 1 month   Madelin Ray, PharmD Clinical Pharmacist [facility]    "

## 2025-01-14 ENCOUNTER — Other Ambulatory Visit

## 2025-05-11 ENCOUNTER — Ambulatory Visit: Admitting: Family Medicine

## 2025-12-17 ENCOUNTER — Ambulatory Visit
